# Patient Record
Sex: Female | Born: 1935 | Race: Black or African American | Hispanic: No | Smoking: Former smoker
Health system: Southern US, Community
[De-identification: ages and names within clinical notes are randomized; demographics above are authoritative.]

## PROBLEM LIST (undated history)

## (undated) DIAGNOSIS — I872 Venous insufficiency (chronic) (peripheral): Secondary | ICD-10-CM

## (undated) DIAGNOSIS — N183 Chronic kidney disease, stage 3 (moderate): Secondary | ICD-10-CM

## (undated) DIAGNOSIS — F419 Anxiety disorder, unspecified: Secondary | ICD-10-CM

## (undated) DIAGNOSIS — I119 Hypertensive heart disease without heart failure: Secondary | ICD-10-CM

## (undated) DIAGNOSIS — B20 Human immunodeficiency virus [HIV] disease: Secondary | ICD-10-CM

## (undated) DIAGNOSIS — Z8541 Personal history of malignant neoplasm of cervix uteri: Secondary | ICD-10-CM

## (undated) DIAGNOSIS — F0150 Vascular dementia without behavioral disturbance: Secondary | ICD-10-CM

## (undated) DIAGNOSIS — B351 Tinea unguium: Secondary | ICD-10-CM

## (undated) DIAGNOSIS — I509 Heart failure, unspecified: Secondary | ICD-10-CM

## (undated) DIAGNOSIS — R87612 Low grade squamous intraepithelial lesion on cytologic smear of cervix (LGSIL): Secondary | ICD-10-CM

## (undated) DIAGNOSIS — R911 Solitary pulmonary nodule: Secondary | ICD-10-CM

## (undated) DIAGNOSIS — C55 Malignant neoplasm of uterus, part unspecified: Secondary | ICD-10-CM

## (undated) DIAGNOSIS — C801 Malignant (primary) neoplasm, unspecified: Secondary | ICD-10-CM

## (undated) DIAGNOSIS — D638 Anemia in other chronic diseases classified elsewhere: Secondary | ICD-10-CM

## (undated) DIAGNOSIS — H35039 Hypertensive retinopathy, unspecified eye: Secondary | ICD-10-CM

## (undated) DIAGNOSIS — E663 Overweight: Secondary | ICD-10-CM

## (undated) DIAGNOSIS — I700 Atherosclerosis of aorta: Secondary | ICD-10-CM

## (undated) DIAGNOSIS — F32A Depression, unspecified: Secondary | ICD-10-CM

## (undated) DIAGNOSIS — I739 Peripheral vascular disease, unspecified: Secondary | ICD-10-CM

## (undated) DIAGNOSIS — H3530 Unspecified macular degeneration: Secondary | ICD-10-CM

## (undated) DIAGNOSIS — E559 Vitamin D deficiency, unspecified: Secondary | ICD-10-CM

## (undated) DIAGNOSIS — F0280 Dementia in other diseases classified elsewhere without behavioral disturbance: Secondary | ICD-10-CM

## (undated) DIAGNOSIS — I10 Essential (primary) hypertension: Secondary | ICD-10-CM

## (undated) HISTORY — DX: Solitary pulmonary nodule: R91.1

## (undated) HISTORY — DX: Venous insufficiency (chronic) (peripheral): I87.2

## (undated) HISTORY — DX: Anxiety disorder, unspecified: F41.9

## (undated) HISTORY — DX: Dementia in other diseases classified elsewhere, unspecified severity, without behavioral disturbance, psychotic disturbance, mood disturbance, and anxiety: F02.80

## (undated) HISTORY — DX: Anemia in other chronic diseases classified elsewhere: D63.8

## (undated) HISTORY — DX: Personal history of malignant neoplasm of cervix uteri: Z85.41

## (undated) HISTORY — DX: Hypertensive retinopathy, unspecified eye: H35.039

## (undated) HISTORY — DX: Malignant neoplasm of uterus, part unspecified: C55

## (undated) HISTORY — DX: Heart failure, unspecified: I50.9

## (undated) HISTORY — DX: Overweight: E66.3

## (undated) HISTORY — DX: Atherosclerosis of aorta: I70.0

## (undated) HISTORY — DX: Human immunodeficiency virus (HIV) disease: B20

## (undated) HISTORY — DX: Low grade squamous intraepithelial lesion on cytologic smear of cervix (LGSIL): R87.612

## (undated) HISTORY — DX: Tinea unguium: B35.1

## (undated) HISTORY — DX: Essential (primary) hypertension: I10

## (undated) HISTORY — DX: Vitamin D deficiency, unspecified: E55.9

## (undated) HISTORY — DX: Malignant (primary) neoplasm, unspecified: C80.1

## (undated) HISTORY — PX: ABDOMINAL HYSTERECTOMY: SHX81

## (undated) HISTORY — DX: Chronic kidney disease, stage 3 (moderate): N18.3

## (undated) HISTORY — DX: Vascular dementia, unspecified severity, without behavioral disturbance, psychotic disturbance, mood disturbance, and anxiety: F01.50

## (undated) HISTORY — DX: Peripheral vascular disease, unspecified: I73.9

## (undated) HISTORY — DX: Unspecified macular degeneration: H35.30

## (undated) HISTORY — DX: Hypertensive heart disease without heart failure: I11.9

## (undated) HISTORY — DX: Depression, unspecified: F32.A

---

## 2005-11-06 ENCOUNTER — Encounter: Payer: Self-pay | Admitting: Cardiology

## 2005-11-06 ENCOUNTER — Ambulatory Visit: Payer: Self-pay

## 2006-02-13 ENCOUNTER — Ambulatory Visit (HOSPITAL_COMMUNITY): Admission: RE | Admit: 2006-02-13 | Discharge: 2006-02-13 | Payer: Self-pay | Admitting: Family Medicine

## 2006-02-13 ENCOUNTER — Encounter: Payer: Self-pay | Admitting: Vascular Surgery

## 2006-08-12 ENCOUNTER — Encounter: Payer: Self-pay | Admitting: Internal Medicine

## 2006-09-12 ENCOUNTER — Encounter: Admission: RE | Admit: 2006-09-12 | Discharge: 2006-09-12 | Payer: Self-pay | Admitting: Internal Medicine

## 2006-09-12 ENCOUNTER — Ambulatory Visit: Payer: Self-pay | Admitting: Internal Medicine

## 2006-09-12 DIAGNOSIS — B20 Human immunodeficiency virus [HIV] disease: Secondary | ICD-10-CM

## 2006-09-12 HISTORY — DX: Human immunodeficiency virus (HIV) disease: B20

## 2006-09-12 LAB — CONVERTED CEMR LAB
ALT: 16 units/L (ref 0–35)
AST: 16 units/L (ref 0–37)
Albumin: 4.5 g/dL (ref 3.5–5.2)
Alkaline Phosphatase: 109 units/L (ref 39–117)
BUN: 16 mg/dL (ref 6–23)
Basophils Absolute: 0 10*3/uL (ref 0.0–0.1)
Basophils Relative: 0 % (ref 0–1)
Bilirubin Urine: NEGATIVE
CO2: 20 meq/L (ref 19–32)
Calcium: 10.1 mg/dL (ref 8.4–10.5)
Chlamydia, Swab/Urine, PCR: NEGATIVE
Chloride: 106 meq/L (ref 96–112)
Cholesterol: 209 mg/dL — ABNORMAL HIGH (ref 0–200)
Creatinine, Ser: 0.95 mg/dL (ref 0.40–1.20)
Eosinophils Absolute: 0.1 10*3/uL (ref 0.0–0.7)
Eosinophils Relative: 2 % (ref 0–5)
GC Probe Amp, Urine: NEGATIVE
Glucose, Bld: 91 mg/dL (ref 70–99)
HCT: 38.5 % (ref 36.0–46.0)
HCV Ab: NEGATIVE
HCV Ab: NEGATIVE
HDL: 48 mg/dL (ref >39)
HIV 1 RNA Quant: 9390 copies/mL — ABNORMAL HIGH (ref <50)
HIV-1 RNA Quant, Log: 3.97 — ABNORMAL HIGH (ref <1.70)
HIV-1 antibody: POSITIVE — AB
HIV-2 Ab: UNDETERMINED — AB
HIV: REACTIVE
Hemoglobin: 12.8 g/dL (ref 12.0–15.0)
Hep B Core Total Ab: NEGATIVE
Hep B S Ab: NEGATIVE
Hep B S Ab: NEGATIVE
Hepatitis B Surface Ag: NEGATIVE
Hepatitis B Surface Ag: NEGATIVE
Ketones, ur: NEGATIVE mg/dL
LDL Cholesterol: 122 mg/dL — ABNORMAL HIGH (ref 0–99)
Lymphocytes Relative: 39 % (ref 12–46)
Lymphs Abs: 2.3 10*3/uL (ref 0.7–3.3)
MCHC: 33.2 g/dL (ref 30.0–36.0)
MCV: 92.8 fL (ref 78.0–100.0)
Monocytes Absolute: 0.7 10*3/uL (ref 0.2–0.7)
Monocytes Relative: 11 % (ref 3–11)
Neutro Abs: 2.8 10*3/uL (ref 1.7–7.7)
Neutrophils Relative %: 48 % (ref 43–77)
Nitrite: NEGATIVE
Platelets: 395 10*3/uL (ref 150–400)
Potassium: 4.1 meq/L (ref 3.5–5.3)
Protein, ur: NEGATIVE mg/dL
RBC / HPF: NONE SEEN (ref <3)
RBC: 4.15 M/uL (ref 3.87–5.11)
RDW: 13.6 % (ref 11.5–14.0)
Sodium: 140 meq/L (ref 135–145)
Specific Gravity, Urine: 1.01 (ref 1.005–1.03)
Total Bilirubin: 0.3 mg/dL (ref 0.3–1.2)
Total CHOL/HDL Ratio: 4.4
Total Protein: 9 g/dL — ABNORMAL HIGH (ref 6.0–8.3)
Triglycerides: 193 mg/dL — ABNORMAL HIGH (ref <150)
Urine Glucose: NEGATIVE mg/dL
Urobilinogen, UA: 0.2 (ref 0.0–1.0)
VLDL: 39 mg/dL (ref 0–40)
WBC: 5.9 10*3/uL (ref 4.0–10.5)
pH: 6 (ref 5.0–8.0)

## 2006-09-19 DIAGNOSIS — I10 Essential (primary) hypertension: Secondary | ICD-10-CM

## 2006-09-19 HISTORY — DX: Essential (primary) hypertension: I10

## 2006-09-27 ENCOUNTER — Ambulatory Visit: Payer: Self-pay | Admitting: Internal Medicine

## 2006-10-07 ENCOUNTER — Telehealth: Payer: Self-pay | Admitting: Internal Medicine

## 2006-10-14 ENCOUNTER — Encounter: Payer: Self-pay | Admitting: Internal Medicine

## 2006-10-29 ENCOUNTER — Ambulatory Visit: Payer: Self-pay | Admitting: Internal Medicine

## 2006-10-29 ENCOUNTER — Encounter: Admission: RE | Admit: 2006-10-29 | Discharge: 2006-10-29 | Payer: Self-pay | Admitting: Internal Medicine

## 2006-10-29 LAB — CONVERTED CEMR LAB
ALT: 18 units/L (ref 0–35)
AST: 22 units/L (ref 0–37)
Albumin: 4.2 g/dL (ref 3.5–5.2)
Alkaline Phosphatase: 90 units/L (ref 39–117)
BUN: 13 mg/dL (ref 6–23)
Basophils Absolute: 0 10*3/uL (ref 0.0–0.1)
Basophils Relative: 1 % (ref 0–1)
CO2: 21 meq/L (ref 19–32)
Calcium: 9.8 mg/dL (ref 8.4–10.5)
Chloride: 108 meq/L (ref 96–112)
Creatinine, Ser: 1.12 mg/dL (ref 0.40–1.20)
Eosinophils Absolute: 0.2 10*3/uL (ref 0.0–0.7)
Eosinophils Relative: 4 % (ref 0–5)
Glucose, Bld: 100 mg/dL — ABNORMAL HIGH (ref 70–99)
HCT: 38 % (ref 36.0–46.0)
HIV 1 RNA Quant: 89 copies/mL — ABNORMAL HIGH (ref <50)
HIV-1 RNA Quant, Log: 1.95 — ABNORMAL HIGH (ref <1.70)
Hemoglobin: 12.1 g/dL (ref 12.0–15.0)
Lymphocytes Relative: 35 % (ref 12–46)
Lymphs Abs: 1.4 10*3/uL (ref 0.7–3.3)
MCHC: 31.8 g/dL (ref 30.0–36.0)
MCV: 97.7 fL (ref 78.0–100.0)
Monocytes Absolute: 0.4 10*3/uL (ref 0.2–0.7)
Monocytes Relative: 10 % (ref 3–11)
Neutro Abs: 2.1 10*3/uL (ref 1.7–7.7)
Neutrophils Relative %: 52 % (ref 43–77)
Platelets: 357 10*3/uL (ref 150–400)
Potassium: 4.9 meq/L (ref 3.5–5.3)
RBC: 3.89 M/uL (ref 3.87–5.11)
RDW: 15.1 % — ABNORMAL HIGH (ref 11.5–14.0)
Sodium: 138 meq/L (ref 135–145)
Total Bilirubin: 0.3 mg/dL (ref 0.3–1.2)
Total Protein: 8.1 g/dL (ref 6.0–8.3)
WBC: 4.1 10*3/uL (ref 4.0–10.5)

## 2006-10-30 ENCOUNTER — Encounter: Payer: Self-pay | Admitting: Internal Medicine

## 2006-10-30 LAB — CONVERTED CEMR LAB: CD4 Count: 130 microliters

## 2006-11-05 ENCOUNTER — Encounter (INDEPENDENT_AMBULATORY_CARE_PROVIDER_SITE_OTHER): Payer: Self-pay | Admitting: *Deleted

## 2006-11-13 ENCOUNTER — Ambulatory Visit: Payer: Self-pay | Admitting: Internal Medicine

## 2007-01-27 ENCOUNTER — Encounter: Admission: RE | Admit: 2007-01-27 | Discharge: 2007-01-27 | Payer: Self-pay | Admitting: Internal Medicine

## 2007-01-27 ENCOUNTER — Ambulatory Visit: Payer: Self-pay | Admitting: Internal Medicine

## 2007-01-27 LAB — CONVERTED CEMR LAB
ALT: 13 units/L (ref 0–35)
AST: 16 units/L (ref 0–37)
Albumin: 4.3 g/dL (ref 3.5–5.2)
Alkaline Phosphatase: 88 units/L (ref 39–117)
BUN: 20 mg/dL (ref 6–23)
Basophils Absolute: 0 10*3/uL (ref 0.0–0.1)
Basophils Relative: 1 % (ref 0–1)
CO2: 20 meq/L (ref 19–32)
Calcium: 10.1 mg/dL (ref 8.4–10.5)
Chloride: 108 meq/L (ref 96–112)
Creatinine, Ser: 1.21 mg/dL — ABNORMAL HIGH (ref 0.40–1.20)
Eosinophils Absolute: 0.1 10*3/uL (ref 0.0–0.7)
Eosinophils Relative: 2 % (ref 0–5)
Glucose, Bld: 93 mg/dL (ref 70–99)
HCT: 35.4 % — ABNORMAL LOW (ref 36.0–46.0)
Hemoglobin: 11.1 g/dL — ABNORMAL LOW (ref 12.0–15.0)
Lymphocytes Relative: 43 % (ref 12–46)
Lymphs Abs: 1.9 10*3/uL (ref 0.7–3.3)
MCHC: 31.4 g/dL (ref 30.0–36.0)
MCV: 103.2 fL — ABNORMAL HIGH (ref 78.0–100.0)
Monocytes Absolute: 0.5 10*3/uL (ref 0.2–0.7)
Monocytes Relative: 11 % (ref 3–11)
Neutro Abs: 1.9 10*3/uL (ref 1.7–7.7)
Neutrophils Relative %: 44 % (ref 43–77)
Platelets: 323 10*3/uL (ref 150–400)
Potassium: 5.2 meq/L (ref 3.5–5.3)
RBC: 3.43 M/uL — ABNORMAL LOW (ref 3.87–5.11)
RDW: 15.2 % — ABNORMAL HIGH (ref 11.5–14.0)
Sodium: 139 meq/L (ref 135–145)
Total Bilirubin: 0.2 mg/dL — ABNORMAL LOW (ref 0.3–1.2)
Total Protein: 8 g/dL (ref 6.0–8.3)
WBC: 4.3 10*3/uL (ref 4.0–10.5)

## 2007-02-11 ENCOUNTER — Ambulatory Visit: Payer: Self-pay | Admitting: Internal Medicine

## 2007-02-11 LAB — CONVERTED CEMR LAB
HIV 1 RNA Quant: 125 copies/mL — ABNORMAL HIGH (ref <50)
HIV-1 RNA Quant, Log: 2.1 — ABNORMAL HIGH (ref <1.70)

## 2007-04-28 ENCOUNTER — Encounter: Payer: Self-pay | Admitting: Internal Medicine

## 2007-05-15 ENCOUNTER — Ambulatory Visit: Payer: Self-pay | Admitting: Internal Medicine

## 2007-05-15 ENCOUNTER — Encounter: Admission: RE | Admit: 2007-05-15 | Discharge: 2007-05-15 | Payer: Self-pay | Admitting: Internal Medicine

## 2007-05-15 LAB — CONVERTED CEMR LAB
ALT: 16 units/L (ref 0–35)
AST: 21 units/L (ref 0–37)
Albumin: 4.3 g/dL (ref 3.5–5.2)
Alkaline Phosphatase: 90 units/L (ref 39–117)
BUN: 22 mg/dL (ref 6–23)
Basophils Absolute: 0 10*3/uL (ref 0.0–0.1)
Basophils Relative: 0 % (ref 0–1)
CO2: 20 meq/L (ref 19–32)
Calcium: 9.8 mg/dL (ref 8.4–10.5)
Chloride: 104 meq/L (ref 96–112)
Creatinine, Ser: 1.18 mg/dL (ref 0.40–1.20)
Eosinophils Absolute: 0.1 10*3/uL (ref 0.0–0.7)
Eosinophils Relative: 1 % (ref 0–5)
Glucose, Bld: 97 mg/dL (ref 70–99)
HCT: 37.3 % (ref 36.0–46.0)
HIV 1 RNA Quant: <50 copies/mL (ref <50)
HIV-1 RNA Quant, Log: <1.7 (ref <1.70)
Hemoglobin: 11.7 g/dL — ABNORMAL LOW (ref 12.0–15.0)
Lymphocytes Relative: 43 % (ref 12–46)
Lymphs Abs: 2 10*3/uL (ref 0.7–4.0)
MCHC: 31.4 g/dL (ref 30.0–36.0)
MCV: 101.6 fL — ABNORMAL HIGH (ref 78.0–100.0)
Monocytes Absolute: 0.6 10*3/uL (ref 0.1–1.0)
Monocytes Relative: 12 % (ref 3–12)
Neutro Abs: 2.1 10*3/uL (ref 1.7–7.7)
Neutrophils Relative %: 44 % (ref 43–77)
Platelets: 367 10*3/uL (ref 150–400)
Potassium: 5 meq/L (ref 3.5–5.3)
RBC: 3.67 M/uL — ABNORMAL LOW (ref 3.87–5.11)
RDW: 12.6 % (ref 11.5–15.5)
Sodium: 136 meq/L (ref 135–145)
Total Bilirubin: 0.2 mg/dL — ABNORMAL LOW (ref 0.3–1.2)
Total Protein: 8.3 g/dL (ref 6.0–8.3)
WBC: 4.7 10*3/uL (ref 4.0–10.5)

## 2007-05-28 ENCOUNTER — Ambulatory Visit: Payer: Self-pay | Admitting: Internal Medicine

## 2007-07-23 ENCOUNTER — Encounter: Admission: RE | Admit: 2007-07-23 | Discharge: 2007-07-23 | Payer: Self-pay | Admitting: Internal Medicine

## 2007-07-23 ENCOUNTER — Ambulatory Visit: Payer: Self-pay | Admitting: Internal Medicine

## 2007-07-23 LAB — CONVERTED CEMR LAB
ALT: 19 units/L (ref 0–35)
AST: 20 units/L (ref 0–37)
Albumin: 4.4 g/dL (ref 3.5–5.2)
Alkaline Phosphatase: 101 units/L (ref 39–117)
BUN: 17 mg/dL (ref 6–23)
Basophils Absolute: 0 10*3/uL (ref 0.0–0.1)
Basophils Relative: 0 % (ref 0–1)
CO2: 18 meq/L — ABNORMAL LOW (ref 19–32)
Calcium: 9.4 mg/dL (ref 8.4–10.5)
Chloride: 109 meq/L (ref 96–112)
Cholesterol: 185 mg/dL (ref 0–200)
Creatinine, Ser: 1.01 mg/dL (ref 0.40–1.20)
Eosinophils Absolute: 0.1 10*3/uL (ref 0.0–0.7)
Eosinophils Relative: 2 % (ref 0–5)
Glucose, Bld: 102 mg/dL — ABNORMAL HIGH (ref 70–99)
HCT: 34.9 % — ABNORMAL LOW (ref 36.0–46.0)
HDL: 65 mg/dL (ref >39)
HIV 1 RNA Quant: <50 copies/mL (ref <50)
HIV-1 RNA Quant, Log: <1.7 (ref <1.70)
Hemoglobin: 11.6 g/dL — ABNORMAL LOW (ref 12.0–15.0)
LDL Cholesterol: 102 mg/dL — ABNORMAL HIGH (ref 0–99)
Lymphocytes Relative: 34 % (ref 12–46)
Lymphs Abs: 1.7 10*3/uL (ref 0.7–4.0)
MCHC: 33.2 g/dL (ref 30.0–36.0)
MCV: 100.3 fL — ABNORMAL HIGH (ref 78.0–100.0)
Monocytes Absolute: 0.5 10*3/uL (ref 0.1–1.0)
Monocytes Relative: 11 % (ref 3–12)
Neutro Abs: 2.7 10*3/uL (ref 1.7–7.7)
Neutrophils Relative %: 53 % (ref 43–77)
Platelets: 387 10*3/uL (ref 150–400)
Potassium: 4.8 meq/L (ref 3.5–5.3)
RBC: 3.48 M/uL — ABNORMAL LOW (ref 3.87–5.11)
RDW: 14.5 % (ref 11.5–15.5)
Sodium: 139 meq/L (ref 135–145)
Total Bilirubin: 0.2 mg/dL — ABNORMAL LOW (ref 0.3–1.2)
Total CHOL/HDL Ratio: 2.8
Total Protein: 8.1 g/dL (ref 6.0–8.3)
Triglycerides: 88 mg/dL (ref <150)
VLDL: 18 mg/dL (ref 0–40)
WBC: 5 10*3/uL (ref 4.0–10.5)

## 2007-08-06 ENCOUNTER — Encounter: Payer: Self-pay | Admitting: Internal Medicine

## 2007-08-06 ENCOUNTER — Ambulatory Visit: Payer: Self-pay | Admitting: Internal Medicine

## 2007-08-06 DIAGNOSIS — R87612 Low grade squamous intraepithelial lesion on cytologic smear of cervix (LGSIL): Secondary | ICD-10-CM

## 2007-08-06 HISTORY — DX: Low grade squamous intraepithelial lesion on cytologic smear of cervix (LGSIL): R87.612

## 2007-08-06 LAB — CONVERTED CEMR LAB: Pap Smear: ABNORMAL

## 2007-08-29 ENCOUNTER — Telehealth: Payer: Self-pay | Admitting: Internal Medicine

## 2007-10-02 ENCOUNTER — Encounter: Payer: Self-pay | Admitting: Internal Medicine

## 2007-10-16 ENCOUNTER — Ambulatory Visit: Payer: Self-pay | Admitting: Obstetrics & Gynecology

## 2007-10-16 ENCOUNTER — Other Ambulatory Visit: Admission: RE | Admit: 2007-10-16 | Discharge: 2007-10-16 | Payer: Self-pay | Admitting: Gynecology

## 2007-10-29 ENCOUNTER — Ambulatory Visit: Payer: Self-pay | Admitting: Gynecology

## 2007-11-12 ENCOUNTER — Ambulatory Visit: Payer: Self-pay | Admitting: Internal Medicine

## 2007-11-12 ENCOUNTER — Encounter: Admission: RE | Admit: 2007-11-12 | Discharge: 2007-11-12 | Payer: Self-pay | Admitting: Internal Medicine

## 2007-11-12 LAB — CONVERTED CEMR LAB
ALT: 18 units/L (ref 0–35)
AST: 16 units/L (ref 0–37)
Albumin: 4.1 g/dL (ref 3.5–5.2)
Alkaline Phosphatase: 84 units/L (ref 39–117)
BUN: 22 mg/dL (ref 6–23)
Basophils Absolute: 0 10*3/uL (ref 0.0–0.1)
Basophils Relative: 1 % (ref 0–1)
CO2: 19 meq/L (ref 19–32)
Calcium: 10.1 mg/dL (ref 8.4–10.5)
Chloride: 105 meq/L (ref 96–112)
Creatinine, Ser: 1.31 mg/dL — ABNORMAL HIGH (ref 0.40–1.20)
Eosinophils Absolute: 0.1 10*3/uL (ref 0.0–0.7)
Eosinophils Relative: 2 % (ref 0–5)
Glucose, Bld: 108 mg/dL — ABNORMAL HIGH (ref 70–99)
HCT: 37.2 % (ref 36.0–46.0)
HIV 1 RNA Quant: <50 copies/mL (ref <50)
HIV-1 RNA Quant, Log: <1.7 (ref <1.70)
Hemoglobin: 11.6 g/dL — ABNORMAL LOW (ref 12.0–15.0)
Lymphocytes Relative: 38 % (ref 12–46)
Lymphs Abs: 1.8 10*3/uL (ref 0.7–4.0)
MCHC: 31.2 g/dL (ref 30.0–36.0)
MCV: 101.6 fL — ABNORMAL HIGH (ref 78.0–100.0)
Monocytes Absolute: 0.5 10*3/uL (ref 0.1–1.0)
Monocytes Relative: 11 % (ref 3–12)
Neutro Abs: 2.3 10*3/uL (ref 1.7–7.7)
Neutrophils Relative %: 48 % (ref 43–77)
Platelets: 358 10*3/uL (ref 150–400)
Potassium: 5 meq/L (ref 3.5–5.3)
RBC: 3.66 M/uL — ABNORMAL LOW (ref 3.87–5.11)
RDW: 13.6 % (ref 11.5–15.5)
Sodium: 138 meq/L (ref 135–145)
Total Bilirubin: 0.2 mg/dL — ABNORMAL LOW (ref 0.3–1.2)
Total Protein: 8.3 g/dL (ref 6.0–8.3)
WBC: 4.7 10*3/uL (ref 4.0–10.5)

## 2007-11-26 ENCOUNTER — Ambulatory Visit: Payer: Self-pay | Admitting: Internal Medicine

## 2008-02-18 ENCOUNTER — Ambulatory Visit: Payer: Self-pay | Admitting: Internal Medicine

## 2008-02-18 LAB — CONVERTED CEMR LAB
ALT: 14 units/L (ref 0–35)
AST: 15 units/L (ref 0–37)
Albumin: 4.5 g/dL (ref 3.5–5.2)
Alkaline Phosphatase: 91 units/L (ref 39–117)
BUN: 20 mg/dL (ref 6–23)
Basophils Absolute: 0 10*3/uL (ref 0.0–0.1)
Basophils Relative: 0 % (ref 0–1)
CO2: 19 meq/L (ref 19–32)
Calcium: 9.8 mg/dL (ref 8.4–10.5)
Chloride: 105 meq/L (ref 96–112)
Creatinine, Ser: 1.22 mg/dL — ABNORMAL HIGH (ref 0.40–1.20)
Eosinophils Absolute: 0.1 10*3/uL (ref 0.0–0.7)
Eosinophils Relative: 2 % (ref 0–5)
Glucose, Bld: 100 mg/dL — ABNORMAL HIGH (ref 70–99)
HCT: 35.7 % — ABNORMAL LOW (ref 36.0–46.0)
HIV 1 RNA Quant: 210 copies/mL — ABNORMAL HIGH (ref <50)
HIV-1 RNA Quant, Log: 2.32 — ABNORMAL HIGH (ref <1.70)
Hemoglobin: 11.6 g/dL — ABNORMAL LOW (ref 12.0–15.0)
Lymphocytes Relative: 47 % — ABNORMAL HIGH (ref 12–46)
Lymphs Abs: 2.6 10*3/uL (ref 0.7–4.0)
MCHC: 32.5 g/dL (ref 30.0–36.0)
MCV: 98.3 fL (ref 78.0–100.0)
Monocytes Absolute: 0.6 10*3/uL (ref 0.1–1.0)
Monocytes Relative: 11 % (ref 3–12)
Neutro Abs: 2.2 10*3/uL (ref 1.7–7.7)
Neutrophils Relative %: 40 % — ABNORMAL LOW (ref 43–77)
Platelets: 439 10*3/uL — ABNORMAL HIGH (ref 150–400)
Potassium: 5.3 meq/L (ref 3.5–5.3)
RBC: 3.63 M/uL — ABNORMAL LOW (ref 3.87–5.11)
RDW: 13.6 % (ref 11.5–15.5)
Sodium: 136 meq/L (ref 135–145)
Total Bilirubin: 0.2 mg/dL — ABNORMAL LOW (ref 0.3–1.2)
Total Protein: 8.4 g/dL — ABNORMAL HIGH (ref 6.0–8.3)
WBC: 5.5 10*3/uL (ref 4.0–10.5)

## 2008-03-03 ENCOUNTER — Ambulatory Visit: Payer: Self-pay | Admitting: Internal Medicine

## 2008-05-05 ENCOUNTER — Ambulatory Visit: Payer: Self-pay | Admitting: Internal Medicine

## 2008-05-05 LAB — CONVERTED CEMR LAB
ALT: 18 units/L (ref 0–35)
AST: 18 units/L (ref 0–37)
Albumin: 4.3 g/dL (ref 3.5–5.2)
Alkaline Phosphatase: 94 units/L (ref 39–117)
BUN: 17 mg/dL (ref 6–23)
Basophils Absolute: 0 10*3/uL (ref 0.0–0.1)
Basophils Relative: 1 % (ref 0–1)
CO2: 20 meq/L (ref 19–32)
Calcium: 10.2 mg/dL (ref 8.4–10.5)
Chloride: 104 meq/L (ref 96–112)
Creatinine, Ser: 1.21 mg/dL — ABNORMAL HIGH (ref 0.40–1.20)
Eosinophils Absolute: 0.1 10*3/uL (ref 0.0–0.7)
Eosinophils Relative: 1 % (ref 0–5)
Glucose, Bld: 99 mg/dL (ref 70–99)
HCT: 38 % (ref 36.0–46.0)
HIV 1 RNA Quant: 117 copies/mL — ABNORMAL HIGH (ref <48)
HIV-1 RNA Quant, Log: 2.07 — ABNORMAL HIGH (ref <1.68)
Hemoglobin: 12.2 g/dL (ref 12.0–15.0)
Lymphocytes Relative: 36 % (ref 12–46)
Lymphs Abs: 2.2 10*3/uL (ref 0.7–4.0)
MCHC: 32.1 g/dL (ref 30.0–36.0)
MCV: 101.1 fL — ABNORMAL HIGH (ref 78.0–100.0)
Monocytes Absolute: 0.8 10*3/uL (ref 0.1–1.0)
Monocytes Relative: 14 % — ABNORMAL HIGH (ref 3–12)
Neutro Abs: 3 10*3/uL (ref 1.7–7.7)
Neutrophils Relative %: 49 % (ref 43–77)
Platelets: 364 10*3/uL (ref 150–400)
Potassium: 5.1 meq/L (ref 3.5–5.3)
RBC: 3.76 M/uL — ABNORMAL LOW (ref 3.87–5.11)
RDW: 13.8 % (ref 11.5–15.5)
Sodium: 138 meq/L (ref 135–145)
Total Bilirubin: 0.3 mg/dL (ref 0.3–1.2)
Total Protein: 8.2 g/dL (ref 6.0–8.3)
WBC: 6.1 10*3/uL (ref 4.0–10.5)

## 2008-05-18 ENCOUNTER — Encounter: Payer: Self-pay | Admitting: Internal Medicine

## 2008-05-19 ENCOUNTER — Ambulatory Visit: Payer: Self-pay | Admitting: Internal Medicine

## 2008-05-21 ENCOUNTER — Encounter (INDEPENDENT_AMBULATORY_CARE_PROVIDER_SITE_OTHER): Payer: Self-pay | Admitting: *Deleted

## 2008-05-21 ENCOUNTER — Telehealth (INDEPENDENT_AMBULATORY_CARE_PROVIDER_SITE_OTHER): Payer: Self-pay | Admitting: *Deleted

## 2008-05-25 ENCOUNTER — Telehealth: Payer: Self-pay | Admitting: Internal Medicine

## 2008-05-27 ENCOUNTER — Ambulatory Visit: Payer: Self-pay | Admitting: Internal Medicine

## 2008-05-27 ENCOUNTER — Encounter: Payer: Self-pay | Admitting: Obstetrics & Gynecology

## 2008-05-27 ENCOUNTER — Ambulatory Visit: Payer: Self-pay | Admitting: Obstetrics and Gynecology

## 2008-05-27 ENCOUNTER — Encounter (INDEPENDENT_AMBULATORY_CARE_PROVIDER_SITE_OTHER): Payer: Self-pay | Admitting: *Deleted

## 2008-05-27 LAB — CONVERTED CEMR LAB: Pap Smear: ABNORMAL

## 2008-06-09 ENCOUNTER — Ambulatory Visit: Payer: Self-pay | Admitting: Internal Medicine

## 2008-06-18 ENCOUNTER — Ambulatory Visit: Payer: Self-pay | Admitting: Internal Medicine

## 2008-08-09 ENCOUNTER — Encounter (INDEPENDENT_AMBULATORY_CARE_PROVIDER_SITE_OTHER): Payer: Self-pay | Admitting: *Deleted

## 2008-08-18 ENCOUNTER — Ambulatory Visit: Payer: Self-pay | Admitting: Internal Medicine

## 2008-08-18 LAB — CONVERTED CEMR LAB
ALT: 19 units/L (ref 0–35)
AST: 18 units/L (ref 0–37)
Albumin: 3.8 g/dL (ref 3.5–5.2)
Alkaline Phosphatase: 84 units/L (ref 39–117)
BUN: 26 mg/dL — ABNORMAL HIGH (ref 6–23)
Basophils Absolute: 0 10*3/uL (ref 0.0–0.1)
Basophils Relative: 0 % (ref 0–1)
CO2: 21 meq/L (ref 19–32)
Calcium: 9.7 mg/dL (ref 8.4–10.5)
Chloride: 106 meq/L (ref 96–112)
Creatinine, Ser: 1.55 mg/dL — ABNORMAL HIGH (ref 0.40–1.20)
Eosinophils Absolute: 0.1 10*3/uL (ref 0.0–0.7)
Eosinophils Relative: 2 % (ref 0–5)
GFR calc Af Amer: 39 mL/min — ABNORMAL LOW (ref >60)
GFR calc non Af Amer: 33 mL/min — ABNORMAL LOW (ref >60)
Glucose, Bld: 100 mg/dL — ABNORMAL HIGH (ref 70–99)
HCT: 32.5 % — ABNORMAL LOW (ref 36.0–46.0)
HIV 1 RNA Quant: <48 copies/mL (ref <48)
HIV-1 RNA Quant, Log: <1.68 (ref <1.68)
Hemoglobin: 10.5 g/dL — ABNORMAL LOW (ref 12.0–15.0)
Lymphocytes Relative: 39 % (ref 12–46)
Lymphs Abs: 1.7 10*3/uL (ref 0.7–4.0)
MCHC: 32.3 g/dL (ref 30.0–36.0)
MCV: 95.9 fL (ref 78.0–100.0)
Monocytes Absolute: 0.4 10*3/uL (ref 0.1–1.0)
Monocytes Relative: 9 % (ref 3–12)
Neutro Abs: 2.2 10*3/uL (ref 1.7–7.7)
Neutrophils Relative %: 50 % (ref 43–77)
Platelets: 324 10*3/uL (ref 150–400)
Potassium: 4.5 meq/L (ref 3.5–5.3)
RBC: 3.39 M/uL — ABNORMAL LOW (ref 3.87–5.11)
RDW: 13.8 % (ref 11.5–15.5)
Sodium: 139 meq/L (ref 135–145)
Total Bilirubin: 0.2 mg/dL — ABNORMAL LOW (ref 0.3–1.2)
Total Protein: 7.6 g/dL (ref 6.0–8.3)
WBC: 4.5 10*3/uL (ref 4.0–10.5)

## 2008-09-01 ENCOUNTER — Ambulatory Visit: Payer: Self-pay | Admitting: Internal Medicine

## 2008-09-01 DIAGNOSIS — N183 Chronic kidney disease, stage 3 unspecified: Secondary | ICD-10-CM

## 2008-09-01 DIAGNOSIS — N1832 Chronic kidney disease, stage 3b: Secondary | ICD-10-CM | POA: Insufficient documentation

## 2008-09-01 HISTORY — DX: Chronic kidney disease, stage 3 unspecified: N18.30

## 2008-11-05 ENCOUNTER — Ambulatory Visit: Payer: Self-pay | Admitting: Obstetrics & Gynecology

## 2008-11-05 ENCOUNTER — Telehealth: Payer: Self-pay | Admitting: Internal Medicine

## 2008-11-05 ENCOUNTER — Encounter: Payer: Self-pay | Admitting: Obstetrics & Gynecology

## 2008-11-05 ENCOUNTER — Emergency Department (HOSPITAL_COMMUNITY): Admission: EM | Admit: 2008-11-05 | Discharge: 2008-11-05 | Payer: Self-pay | Admitting: Emergency Medicine

## 2008-12-08 ENCOUNTER — Ambulatory Visit: Payer: Self-pay | Admitting: Internal Medicine

## 2008-12-08 LAB — CONVERTED CEMR LAB
ALT: 13 units/L (ref 0–35)
AST: 17 units/L (ref 0–37)
Albumin: 4.3 g/dL (ref 3.5–5.2)
Alkaline Phosphatase: 90 units/L (ref 39–117)
BUN: 19 mg/dL (ref 6–23)
Basophils Absolute: 0 10*3/uL (ref 0.0–0.1)
Basophils Relative: 1 % (ref 0–1)
CO2: 22 meq/L (ref 19–32)
Calcium: 10.4 mg/dL (ref 8.4–10.5)
Chloride: 109 meq/L (ref 96–112)
Creatinine, Ser: 1.09 mg/dL (ref 0.40–1.20)
Eosinophils Absolute: 0.2 10*3/uL (ref 0.0–0.7)
Eosinophils Relative: 3 % (ref 0–5)
Glucose, Bld: 84 mg/dL (ref 70–99)
HCT: 36.6 % (ref 36.0–46.0)
HIV 1 RNA Quant: 612 copies/mL — ABNORMAL HIGH (ref <48)
HIV-1 RNA Quant, Log: 2.79 — ABNORMAL HIGH (ref <1.68)
Hemoglobin: 11.5 g/dL — ABNORMAL LOW (ref 12.0–15.0)
Lymphocytes Relative: 33 % (ref 12–46)
Lymphs Abs: 2.1 10*3/uL (ref 0.7–4.0)
MCHC: 31.4 g/dL (ref 30.0–36.0)
MCV: 101.9 fL — ABNORMAL HIGH (ref >=78.0)
Monocytes Absolute: 0.7 10*3/uL (ref 0.1–1.0)
Monocytes Relative: 11 % (ref 3–12)
Neutro Abs: 3.4 10*3/uL (ref 1.7–7.7)
Neutrophils Relative %: 53 % (ref 43–77)
Platelets: 392 10*3/uL (ref 150–400)
Potassium: 4.7 meq/L (ref 3.5–5.3)
RBC: 3.59 M/uL — ABNORMAL LOW (ref 3.87–5.11)
RDW: 13 % (ref 11.5–15.5)
Sodium: 143 meq/L (ref 135–145)
Total Bilirubin: 0.3 mg/dL (ref 0.3–1.2)
Total Protein: 8.1 g/dL (ref 6.0–8.3)
WBC: 6.4 10*3/uL (ref 4.0–10.5)

## 2008-12-09 ENCOUNTER — Ambulatory Visit: Payer: Self-pay | Admitting: Obstetrics and Gynecology

## 2008-12-28 ENCOUNTER — Ambulatory Visit: Payer: Self-pay | Admitting: Internal Medicine

## 2009-01-20 ENCOUNTER — Ambulatory Visit: Payer: Self-pay | Admitting: Obstetrics & Gynecology

## 2009-03-02 ENCOUNTER — Ambulatory Visit: Payer: Self-pay | Admitting: Internal Medicine

## 2009-03-02 LAB — CONVERTED CEMR LAB
ALT: 18 units/L (ref 0–35)
AST: 25 units/L (ref 0–37)
Albumin: 4.2 g/dL (ref 3.5–5.2)
Alkaline Phosphatase: 84 units/L (ref 39–117)
BUN: 18 mg/dL (ref 6–23)
Basophils Absolute: 0 10*3/uL (ref 0.0–0.1)
Basophils Relative: 1 % (ref 0–1)
CO2: 21 meq/L (ref 19–32)
Calcium: 10 mg/dL (ref 8.4–10.5)
Chloride: 106 meq/L (ref 96–112)
Creatinine, Ser: 1 mg/dL (ref 0.40–1.20)
Eosinophils Absolute: 0.2 10*3/uL (ref 0.0–0.7)
Eosinophils Relative: 4 % (ref 0–5)
Glucose, Bld: 97 mg/dL (ref 70–99)
HCT: 37.5 % (ref 36.0–46.0)
HIV 1 RNA Quant: 19700 copies/mL — ABNORMAL HIGH (ref <48)
HIV-1 RNA Quant, Log: 4.29 — ABNORMAL HIGH (ref <1.68)
Hemoglobin: 11.9 g/dL — ABNORMAL LOW (ref 12.0–15.0)
Lymphocytes Relative: 36 % (ref 12–46)
Lymphs Abs: 1.9 10*3/uL (ref 0.7–4.0)
MCHC: 31.7 g/dL (ref 30.0–36.0)
MCV: 95.2 fL (ref >=78.0)
Monocytes Absolute: 0.6 10*3/uL (ref 0.1–1.0)
Monocytes Relative: 11 % (ref 3–12)
Neutro Abs: 2.6 10*3/uL (ref 1.7–7.7)
Neutrophils Relative %: 49 % (ref 43–77)
Platelets: 355 10*3/uL (ref 150–400)
Potassium: 4.5 meq/L (ref 3.5–5.3)
RBC: 3.94 M/uL (ref 3.87–5.11)
RDW: 13 % (ref 11.5–15.5)
Sodium: 141 meq/L (ref 135–145)
Total Bilirubin: 0.3 mg/dL (ref 0.3–1.2)
Total Protein: 8.1 g/dL (ref 6.0–8.3)
WBC: 5.2 10*3/uL (ref 4.0–10.5)

## 2009-03-16 ENCOUNTER — Ambulatory Visit: Payer: Self-pay | Admitting: Internal Medicine

## 2009-03-16 LAB — CONVERTED CEMR LAB

## 2009-04-13 ENCOUNTER — Ambulatory Visit: Payer: Self-pay | Admitting: Internal Medicine

## 2009-05-11 ENCOUNTER — Ambulatory Visit: Payer: Self-pay | Admitting: Obstetrics and Gynecology

## 2009-05-25 ENCOUNTER — Ambulatory Visit: Payer: Self-pay | Admitting: Internal Medicine

## 2009-05-25 LAB — CONVERTED CEMR LAB
ALT: 18 units/L (ref 0–35)
AST: 19 units/L (ref 0–37)
Albumin: 4.1 g/dL (ref 3.5–5.2)
Alkaline Phosphatase: 73 units/L (ref 39–117)
BUN: 24 mg/dL — ABNORMAL HIGH (ref 6–23)
Basophils Absolute: 0 10*3/uL (ref 0.0–0.1)
Basophils Relative: 1 % (ref 0–1)
CO2: 21 meq/L (ref 19–32)
Calcium: 9.3 mg/dL (ref 8.4–10.5)
Chloride: 106 meq/L (ref 96–112)
Creatinine, Ser: 1.06 mg/dL (ref 0.40–1.20)
Eosinophils Absolute: 0.2 10*3/uL (ref 0.0–0.7)
Eosinophils Relative: 3 % (ref 0–5)
Glucose, Bld: 107 mg/dL — ABNORMAL HIGH (ref 70–99)
HCT: 34.9 % — ABNORMAL LOW (ref 36.0–46.0)
HIV 1 RNA Quant: 20800 copies/mL — ABNORMAL HIGH (ref <48)
HIV-1 RNA Quant, Log: 4.32 — ABNORMAL HIGH (ref <1.68)
Hemoglobin: 11.4 g/dL — ABNORMAL LOW (ref 12.0–15.0)
Lymphocytes Relative: 34 % (ref 12–46)
Lymphs Abs: 1.7 10*3/uL (ref 0.7–4.0)
MCHC: 32.7 g/dL (ref 30.0–36.0)
MCV: 94.1 fL (ref >=78.0)
Monocytes Absolute: 0.6 10*3/uL (ref 0.1–1.0)
Monocytes Relative: 12 % (ref 3–12)
Neutro Abs: 2.5 10*3/uL (ref 1.7–7.7)
Neutrophils Relative %: 50 % (ref 43–77)
Platelets: 367 10*3/uL (ref 150–400)
Potassium: 4.3 meq/L (ref 3.5–5.3)
RBC: 3.71 M/uL — ABNORMAL LOW (ref 3.87–5.11)
RDW: 14.6 % (ref 11.5–15.5)
Sodium: 138 meq/L (ref 135–145)
Total Bilirubin: 0.4 mg/dL (ref 0.3–1.2)
Total Protein: 8.3 g/dL (ref 6.0–8.3)
WBC: 5 10*3/uL (ref 4.0–10.5)

## 2009-06-08 ENCOUNTER — Ambulatory Visit: Payer: Self-pay | Admitting: Internal Medicine

## 2009-06-14 ENCOUNTER — Encounter: Payer: Self-pay | Admitting: Internal Medicine

## 2009-07-06 ENCOUNTER — Ambulatory Visit: Payer: Self-pay | Admitting: Internal Medicine

## 2009-08-17 ENCOUNTER — Ambulatory Visit: Payer: Self-pay | Admitting: Internal Medicine

## 2009-08-17 LAB — CONVERTED CEMR LAB
ALT: 16 units/L (ref 0–35)
AST: 17 units/L (ref 0–37)
Albumin: 3.9 g/dL (ref 3.5–5.2)
Alkaline Phosphatase: 73 units/L (ref 39–117)
BUN: 14 mg/dL (ref 6–23)
Basophils Absolute: 0 10*3/uL (ref 0.0–0.1)
Basophils Relative: 1 % (ref 0–1)
CO2: 21 meq/L (ref 19–32)
Calcium: 10 mg/dL (ref 8.4–10.5)
Chloride: 107 meq/L (ref 96–112)
Creatinine, Ser: 1.04 mg/dL (ref 0.40–1.20)
Eosinophils Absolute: 0.2 10*3/uL (ref 0.0–0.7)
Eosinophils Relative: 3 % (ref 0–5)
Glucose, Bld: 97 mg/dL (ref 70–99)
HCT: 36.9 % (ref 36.0–46.0)
HIV 1 RNA Quant: <48 copies/mL (ref <48)
HIV-1 RNA Quant, Log: <1.68 (ref <1.68)
Hemoglobin: 11.7 g/dL — ABNORMAL LOW (ref 12.0–15.0)
Lymphocytes Relative: 42 % (ref 12–46)
Lymphs Abs: 2.5 10*3/uL (ref 0.7–4.0)
MCHC: 31.7 g/dL (ref 30.0–36.0)
MCV: 93.9 fL (ref 78.0–100.0)
Monocytes Absolute: 0.6 10*3/uL (ref 0.1–1.0)
Monocytes Relative: 11 % (ref 3–12)
Neutro Abs: 2.7 10*3/uL (ref 1.7–7.7)
Neutrophils Relative %: 44 % (ref 43–77)
Platelets: 383 10*3/uL (ref 150–400)
Potassium: 4.2 meq/L (ref 3.5–5.3)
RBC: 3.93 M/uL (ref 3.87–5.11)
RDW: 15.1 % (ref 11.5–15.5)
Sodium: 140 meq/L (ref 135–145)
Total Bilirubin: 0.3 mg/dL (ref 0.3–1.2)
Total Protein: 7.9 g/dL (ref 6.0–8.3)
WBC: 6 10*3/uL (ref 4.0–10.5)

## 2009-08-31 ENCOUNTER — Encounter (INDEPENDENT_AMBULATORY_CARE_PROVIDER_SITE_OTHER): Payer: Self-pay | Admitting: *Deleted

## 2009-08-31 ENCOUNTER — Other Ambulatory Visit: Admission: RE | Admit: 2009-08-31 | Discharge: 2009-08-31 | Payer: Self-pay | Admitting: Internal Medicine

## 2009-08-31 ENCOUNTER — Ambulatory Visit: Payer: Self-pay | Admitting: Internal Medicine

## 2009-08-31 LAB — CONVERTED CEMR LAB
Pap Smear: DETECTED
Pap Smear: UNDETERMINED

## 2009-09-13 ENCOUNTER — Encounter (INDEPENDENT_AMBULATORY_CARE_PROVIDER_SITE_OTHER): Payer: Self-pay | Admitting: *Deleted

## 2009-09-15 ENCOUNTER — Encounter: Payer: Self-pay | Admitting: Internal Medicine

## 2009-09-15 ENCOUNTER — Encounter (INDEPENDENT_AMBULATORY_CARE_PROVIDER_SITE_OTHER): Payer: Self-pay | Admitting: *Deleted

## 2009-09-29 ENCOUNTER — Telehealth: Payer: Self-pay | Admitting: Internal Medicine

## 2009-09-30 ENCOUNTER — Encounter (INDEPENDENT_AMBULATORY_CARE_PROVIDER_SITE_OTHER): Payer: Self-pay | Admitting: *Deleted

## 2009-10-05 ENCOUNTER — Telehealth: Payer: Self-pay | Admitting: Internal Medicine

## 2009-10-12 ENCOUNTER — Ambulatory Visit (HOSPITAL_COMMUNITY): Admission: RE | Admit: 2009-10-12 | Discharge: 2009-10-12 | Payer: Self-pay | Admitting: Internal Medicine

## 2009-11-10 ENCOUNTER — Ambulatory Visit: Payer: Self-pay | Admitting: Obstetrics and Gynecology

## 2009-11-17 ENCOUNTER — Encounter: Payer: Self-pay | Admitting: Internal Medicine

## 2009-11-17 ENCOUNTER — Encounter (INDEPENDENT_AMBULATORY_CARE_PROVIDER_SITE_OTHER): Payer: Self-pay | Admitting: *Deleted

## 2009-11-18 ENCOUNTER — Encounter: Payer: Self-pay | Admitting: Internal Medicine

## 2009-11-18 ENCOUNTER — Telehealth: Payer: Self-pay | Admitting: Internal Medicine

## 2009-11-23 ENCOUNTER — Encounter: Payer: Self-pay | Admitting: Internal Medicine

## 2009-12-08 ENCOUNTER — Telehealth: Payer: Self-pay | Admitting: Internal Medicine

## 2009-12-16 ENCOUNTER — Telehealth: Payer: Self-pay | Admitting: Infectious Disease

## 2010-01-11 ENCOUNTER — Telehealth: Payer: Self-pay | Admitting: Internal Medicine

## 2010-01-12 ENCOUNTER — Encounter: Payer: Self-pay | Admitting: Internal Medicine

## 2010-01-16 ENCOUNTER — Telehealth (INDEPENDENT_AMBULATORY_CARE_PROVIDER_SITE_OTHER): Payer: Self-pay | Admitting: *Deleted

## 2010-01-18 ENCOUNTER — Ambulatory Visit: Payer: Self-pay | Admitting: Internal Medicine

## 2010-01-18 LAB — CONVERTED CEMR LAB
ALT: 18 units/L (ref 0–35)
AST: 21 units/L (ref 0–37)
Albumin: 4.2 g/dL (ref 3.5–5.2)
Alkaline Phosphatase: 101 units/L (ref 39–117)
BUN: 20 mg/dL (ref 6–23)
Basophils Absolute: 0 10*3/uL (ref 0.0–0.1)
Basophils Relative: 1 % (ref 0–1)
CO2: 23 meq/L (ref 19–32)
Calcium: 9.8 mg/dL (ref 8.4–10.5)
Chloride: 106 meq/L (ref 96–112)
Cholesterol: 183 mg/dL (ref 0–200)
Creatinine, Ser: 1.15 mg/dL (ref 0.40–1.20)
Eosinophils Absolute: 0.3 10*3/uL (ref 0.0–0.7)
Eosinophils Relative: 5 % (ref 0–5)
Glucose, Bld: 91 mg/dL (ref 70–99)
HCT: 35.5 % — ABNORMAL LOW (ref 36.0–46.0)
HDL: 41 mg/dL (ref >39)
HIV 1 RNA Quant: <20 copies/mL (ref <20)
HIV-1 RNA Quant, Log: <1.3 (ref <1.30)
Hemoglobin: 11.4 g/dL — ABNORMAL LOW (ref 12.0–15.0)
LDL Cholesterol: 104 mg/dL — ABNORMAL HIGH (ref 0–99)
Lymphocytes Relative: 34 % (ref 12–46)
Lymphs Abs: 2 10*3/uL (ref 0.7–4.0)
MCHC: 32.1 g/dL (ref 30.0–36.0)
MCV: 101.1 fL — ABNORMAL HIGH (ref 78.0–100.0)
Monocytes Absolute: 0.6 10*3/uL (ref 0.1–1.0)
Monocytes Relative: 10 % (ref 3–12)
Neutro Abs: 3 10*3/uL (ref 1.7–7.7)
Neutrophils Relative %: 51 % (ref 43–77)
Platelets: 400 10*3/uL (ref 150–400)
Potassium: 4.3 meq/L (ref 3.5–5.3)
RBC: 3.51 M/uL — ABNORMAL LOW (ref 3.87–5.11)
RDW: 13.9 % (ref 11.5–15.5)
Sodium: 141 meq/L (ref 135–145)
Total Bilirubin: 0.2 mg/dL — ABNORMAL LOW (ref 0.3–1.2)
Total CHOL/HDL Ratio: 4.5
Total Protein: 8 g/dL (ref 6.0–8.3)
Triglycerides: 189 mg/dL — ABNORMAL HIGH (ref <150)
VLDL: 38 mg/dL (ref 0–40)
WBC: 5.9 10*3/uL (ref 4.0–10.5)

## 2010-02-01 ENCOUNTER — Ambulatory Visit: Payer: Self-pay | Admitting: Internal Medicine

## 2010-02-10 ENCOUNTER — Encounter: Payer: Self-pay | Admitting: Internal Medicine

## 2010-02-17 ENCOUNTER — Encounter: Payer: Self-pay | Admitting: Internal Medicine

## 2010-03-01 ENCOUNTER — Encounter: Payer: Self-pay | Admitting: Internal Medicine

## 2010-04-11 ENCOUNTER — Encounter (INDEPENDENT_AMBULATORY_CARE_PROVIDER_SITE_OTHER): Payer: Self-pay | Admitting: *Deleted

## 2010-04-20 ENCOUNTER — Encounter: Payer: Self-pay | Admitting: Internal Medicine

## 2010-05-04 ENCOUNTER — Encounter: Payer: Self-pay | Admitting: Internal Medicine

## 2010-05-10 ENCOUNTER — Ambulatory Visit
Admission: RE | Admit: 2010-05-10 | Discharge: 2010-05-10 | Payer: Self-pay | Source: Home / Self Care | Attending: Infectious Diseases | Admitting: Infectious Diseases

## 2010-05-10 ENCOUNTER — Encounter: Payer: Self-pay | Admitting: Internal Medicine

## 2010-05-10 LAB — CONVERTED CEMR LAB
ALT: 20 units/L (ref 0–35)
AST: 21 units/L (ref 0–37)
Albumin: 4.4 g/dL (ref 3.5–5.2)
Alkaline Phosphatase: 107 units/L (ref 39–117)
Basophils Absolute: 0 10*3/uL (ref 0.0–0.1)
Basophils Relative: 0 % (ref 0–1)
Eosinophils Absolute: 0.1 10*3/uL (ref 0.0–0.7)
Glucose, Bld: 105 mg/dL — ABNORMAL HIGH (ref 70–99)
HIV 1 RNA Quant: <20 copies/mL (ref <20)
HIV-1 RNA Quant, Log: <1.3 (ref <1.30)
MCHC: 32 g/dL (ref 30.0–36.0)
MCV: 99.7 fL (ref 78.0–100.0)
Monocytes Relative: 10 % (ref 3–12)
Neutro Abs: 2.9 10*3/uL (ref 1.7–7.7)
Neutrophils Relative %: 51 % (ref 43–77)
Platelets: 390 10*3/uL (ref 150–400)
Potassium: 4.3 meq/L (ref 3.5–5.3)
RDW: 13.4 % (ref 11.5–15.5)
Sodium: 141 meq/L (ref 135–145)
Total Bilirubin: 0.3 mg/dL (ref 0.3–1.2)
Total Protein: 7.8 g/dL (ref 6.0–8.3)

## 2010-05-15 LAB — T-HELPER CELL (CD4) - (RCID CLINIC ONLY)
CD4 % Helper T Cell: 14 % — ABNORMAL LOW (ref 33–55)
CD4 T Cell Abs: 310 uL — ABNORMAL LOW (ref 400–2700)

## 2010-05-20 ENCOUNTER — Encounter: Payer: Self-pay | Admitting: Family Medicine

## 2010-05-24 ENCOUNTER — Other Ambulatory Visit: Payer: Self-pay | Admitting: Family Medicine

## 2010-05-24 ENCOUNTER — Other Ambulatory Visit: Payer: Self-pay | Admitting: Obstetrics and Gynecology

## 2010-05-24 ENCOUNTER — Ambulatory Visit
Admission: RE | Admit: 2010-05-24 | Discharge: 2010-05-24 | Payer: Self-pay | Source: Home / Self Care | Attending: Internal Medicine | Admitting: Internal Medicine

## 2010-05-24 ENCOUNTER — Ambulatory Visit
Admission: RE | Admit: 2010-05-24 | Discharge: 2010-05-24 | Payer: Self-pay | Source: Home / Self Care | Attending: Obstetrics and Gynecology | Admitting: Obstetrics and Gynecology

## 2010-05-24 DIAGNOSIS — Z1231 Encounter for screening mammogram for malignant neoplasm of breast: Secondary | ICD-10-CM

## 2010-05-24 DIAGNOSIS — Z139 Encounter for screening, unspecified: Secondary | ICD-10-CM

## 2010-05-25 NOTE — Progress Notes (Unsigned)
NAME:  LODEMA, PARMA NO.:  [id number]  MEDICAL RECORD NO.:  [id number]          PATIENT TYPE:  WOC  LOCATION:  [facility]                   FACILITY:  [facility]  PHYSICIAN:  Argentina Donovan, MD        DATE OF BIRTH:  1931-05-14  DATE OF SERVICE:                                 CLINIC NOTE  The patient is a 77 year old African American female who had a total abdominal hysterectomy in [place] in 1975 for endometrial cancer, had a colposcopy in August 2002 with no sign of malignancy.  She had Pap smear scheduled on several occasions.  She had a Pap smear in May 2011 that was ascus with HPV high-risk.  In January 2011, she had an LSIL Pap.  In July 2010, she had an LSIL Pap.  She is sent in for colposcopy in July.  I thought that was too early since she had previously just had a Pap of few months before which showed ascus with HPV.  The patient is positive for HIV.  She also has hypertension, in today for repeat Pap which was done without any problems.  The patient is HIV, many years post endometrial cancer with atypical Pap.          ______________________________ Argentina Donovan, MD    PR/MEDQ  D:  05/24/2010  T:  05/25/2010  Job:  [id number]

## 2010-05-30 NOTE — Assessment & Plan Note (Signed)
Summary: FU OV/VS   CC:  f/u labs.  History of Present Illness: Karen Luna is having som tests done for her increased creatinine.  She has been feeling well.  She is taking all of her meds every day.  Preventive Screening-Counseling & Management     Alcohol drinks/day: 0     Smoking Status: never   Updated Prior Medication List: CATAPRES 0.2 MG TABS (CLONIDINE HCL) one two times a day BACTRIM DS 800-160 MG TABS (SULFAMETHOXAZOLE-TRIMETHOPRIM) Take 1 tablet by mouth once a day HYDROCHLOROTHIAZIDE 25 MG TABS (HYDROCHLOROTHIAZIDE) Take 1 tablet by mouth once a day CARDIZEM CD 360 MG XR24H-CAP (DILTIAZEM HCL COATED BEADS) Take 1 tablet by mouth once a day SUSTIVA 600 MG TABS (EFAVIRENZ) Take 1 tablet by mouth at bedtime TRUVADA 200-300 MG TABS (EMTRICITABINE-TENOFOVIR) one tablet every other day  Current Allergies (reviewed today): No known allergies  Additional History    Menstrual Status:  postmenopausal  Review of Systems  The patient denies anorexia, fever, and weight loss.    Vital Signs:  Patient profile:   77 year old female Menstrual status:  postmenopausal Height:      66 inches (167.64 cm) Weight:      186.19 pounds (84.63 kg) BMI:     30.16 Temp:     97.2 degrees F (36.22 degrees C) oral Pulse rate:   50 / minute BP sitting:   184 / 86  (left arm)  Vitals Entered By: Starleen Arms CMA (Sep 01, 2008 2:12 PM) CC: f/u labs Is Patient Diabetic? No Pain Assessment Patient in pain? no      Nutritional Status BMI of > 30 = obese Nutritional Status Detail nl  Have you ever been in a relationship where you felt threatened, hurt or afraid?No   Does patient need assistance? Functional Status Self care Ambulation Normal LMP (date): 04/30/1997  years   days  Menstrual Status postmenopausal Last PAP Result ASC-US, +HPV   Physical Exam  General:  alert, well-developed, well-nourished, and well-hydrated.   Head:  normocephalic and atraumatic.   Mouth:  pharynx  pink and moist.   Lungs:  normal breath sounds.      Impression & Recommendations:  Problem # 1:  HIV DISEASE (ICD-042) Good response.  Due to worsening kidney function will change the Atripla to Truvada and Sustiva and dose the Truvada every 48 hours.  She will repeat labs in 3 months. Diagnostics Reviewed:  HIV: REACTIVE (09/12/2006)   HIV-Western blot: Positive (09/12/2006)   CD4: 250 (08/19/2008)   WBC: 4.5 (08/18/2008)   Hgb: 10.5 (08/18/2008)   HCT: 32.5 (08/18/2008)   Platelets: 324 (08/18/2008) HIV-1 RNA: <48 copies/mL (08/18/2008)   HBSAg: NEG (09/12/2006)  Problem # 2:  RENAL INSUFFICIENCY (ICD-588.9) undergoing testing.  i would like to get her back on her cardizem since it controlled her BP better thatn the atenolol.   Byrd Hesselbach will contact her insurance Co.  Problem # 3:  HYPERTENSION, BENIGN ESSENTIAL, UNCONTROLLED (ICD-401.1)  uncontrolled - attempt to change back to cardizem Her updated medication list for this problem includes:    Catapres 0.2 Mg Tabs (Clonidine hcl) ..... One two times a day    Hydrochlorothiazide 25 Mg Tabs (Hydrochlorothiazide) .Marland Kitchen... Take 1 tablet by mouth once a day    Cardizem Cd 360 Mg Xr24h-cap (Diltiazem hcl coated beads) .Marland Kitchen... Take 1 tablet by mouth once a day  BP today: 184/86 Prior BP: 191/81 (06/18/2008)  Labs Reviewed: K+: 4.5 (08/18/2008) Creat: : 1.55 (08/18/2008)  Chol: 185 (07/23/2007)   HDL: 65 (07/23/2007)   LDL: 102 (07/23/2007)   TG: 88 (07/23/2007)  Medications Added to Medication List This Visit: 1)  Sustiva 600 Mg Tabs (Efavirenz) .... Take 1 tablet by mouth at bedtime 2)  Truvada 200-300 Mg Tabs (Emtricitabine-tenofovir) .... One tablet every other day  Other Orders: Est. Patient Level III (64403) Future Orders: T-CD4 ([id number]) ... 11/30/2008 T-HIV Viral Load [id number]) ... 11/30/2008 T-Comprehensive Metabolic Panel [id number]) ... 11/30/2008 T-CBC w/Diff ([id number]) ... 11/30/2008  Patient  Instructions: 1)  Please schedule a follow-up appointment in 3 months, 2 weeks after labs.  Prescriptions: TRUVADA 200-300 MG TABS (EMTRICITABINE-TENOFOVIR) one tablet every other day  #15 x 5   Entered and Authorized by:   Yisroel Ramming MD   Signed by:   Yisroel Ramming MD on 09/01/2008   Method used:   Print then Give to Patient   RxID:   [id number] SUSTIVA 600 MG TABS (EFAVIRENZ) Take 1 tablet by mouth at bedtime  #30 x 5   Entered and Authorized by:   Yisroel Ramming MD   Signed by:   Yisroel Ramming MD on 09/01/2008   Method used:   Print then Give to Patient   RxID:   [id number]

## 2010-05-30 NOTE — Miscellaneous (Signed)
Summary: HCP Continuous Care: Home Health Cert  HCP Continuous Care: Home Health Cert   Imported By: Florinda Marker 01/12/2010 12:10:43  _____________________________________________________________________  External Attachment:    Type:   Image     Comment:   External Document

## 2010-05-30 NOTE — Medication Information (Signed)
Summary: [facility]: Rx  [facility]: Rx   Imported By: Florinda Marker 03/01/2010 11:07:24  _____________________________________________________________________  External Attachment:    Type:   Image     Comment:   External Document

## 2010-05-30 NOTE — Miscellaneous (Signed)
Summary: 08/31/2009 PAP results added to RW Flowsheet    Appended Document: 08/31/2009 PAP results added to RW Flowsheet Next appt. @ [facility] 11/16/2009, 1 PM.

## 2010-05-30 NOTE — Assessment & Plan Note (Signed)
Summary: FU OV   Chief Complaint:  f/u ov.  History of Present Illness: Pt here for f/u.  She has been taking her meds regularly.  Her insurance plan was changed for Medicare and she had some problems getting her Diovan.  She has everything now.    Current Allergies: No known allergies     Risk Factors:  Tobacco use:  quit   Review of Systems  The patient denies anorexia, fever, weight loss, chest pain, and dyspnea on exhertion.     Vital Signs:  Patient Profile:   77 Years Old Female Height:     66 inches (167.64 cm) Weight:      186.9 pounds BMI:     30.28 Temp:     97.4 degrees F oral Pulse rate:   64 / minute BP sitting:   114 / 73  (right arm)  Pt. in pain?   no  Vitals Entered By: Tomasita Morrow RN (May 28, 2007 9:41 AM)              Is Patient Diabetic? No Nutritional Status BMI of > 30 = obese Nutritional Status Detail normal  Have you ever been in a relationship where you felt threatened, hurt or afraid?No  Domestic Violence Intervention none  Does patient need assistance? Functional Status Self care Ambulation Normal Comments No missed doses of HIV meds     Physical Exam  General:     alert, well-developed, well-nourished, and well-hydrated.   Head:     normocephalic and atraumatic.   Mouth:     pharynx pink and moist.  no thrush  Lungs:     normal breath sounds.   Heart:     normal rate, regular rhythm, and no murmur.      Impression & Recommendations:  Problem # 1:  HIV DISEASE (ICD-042) Pt.s most recent CD4ct was 190 and VL <50 .  Pt instructed to continue the current antiretroviral regimen.  Pt encouraged to take medication regularly and not miss doeses.  Pt will f/u in 3 months for repeat blood work and will see me 2 weeks later.  She was given a Hep B vaccine today.  She was instructed to call if she has any problems getting her meds.  Her updated medication list for this problem includes:    Atripla 600-200-300 Mg Tabs  (Efavirenz-emtricitab-tenofovir) .Marland Kitchen... Take 1 tablet by mouth at bedtime    Bactrim Ds 800-160 Mg Tabs (Sulfamethoxazole-trimethoprim) .Marland Kitchen... Take 1 tablet by mouth once a day  Orders: Est. Patient Level III (95621)  Future Orders: T-CBC w/Diff ([id number]) ... 08/25/2007 T-CD4 ([id number]) ... 08/25/2007 T-Comprehensive Metabolic Panel [id number]) ... 08/25/2007 T-HIV Viral Load [id number]) ... 08/25/2007 T-Lipid Profile [id number]) ... 08/25/2007    Patient Instructions: 1)  Please schedule a follow-up appointment in 3 months, 2 weeks after labs.    Prescriptions: CARDIZEM LA 360 MG TB24 (DILTIAZEM HCL COATED BEADS) one once daily  #30 Tablet x 6   Entered and Authorized by:   Yisroel Ramming MD   Signed by:   Yisroel Ramming MD on 05/28/2007   Method used:   Print then Give to Patient   RxID:   [id number]  ]

## 2010-05-30 NOTE — Progress Notes (Signed)
Summary: RX refill  Phone Note Other Incoming   Caller: Leonor Liv, Home Health Nurse Details for Reason: pts. pulse low Summary of Call: [facility] Nurse called saying pts. pulse was 44, B/P 134/96, pt. has no symptoms and said she feels fine.  He checked it several times. Initial call taken by: Wendall Mola CMA Duncan Dull),  September 29, 2009 4:59 PM  Follow-up for Phone Call        I would cut the diltiazem to 120mg  and monitor Follow-up by: Yisroel Ramming MD,  September 30, 2009 10:21 AM  Additional Follow-up for Phone Call Additional follow up Details #1::        Leonor Liv, at [facility] notified.  Pt. notified and is unable to cut the Diltiazem because it is capsule.  Do you want to give her a new RX Additional Follow-up by: Wendall Mola CMA Duncan Dull),  September 30, 2009 12:14 PM    Additional Follow-up for Phone Call Additional follow up Details #2::    yes Follow-up by: Yisroel Ramming MD,  September 30, 2009 2:28 PM  New/Updated Medications: DILTIAZEM HCL CR 120 MG XR12H-CAP (DILTIAZEM HCL) Take 1 tablet by mouth once a day Prescriptions: DILTIAZEM HCL CR 120 MG XR12H-CAP (DILTIAZEM HCL) Take 1 tablet by mouth once a day  #30 x 5   Entered by:   Wendall Mola CMA ( AAMA)   Authorized by:   Yisroel Ramming MD   Signed by:   Wendall Mola CMA ( AAMA) on 09/30/2009   Method used:   Telephoned to ...       [facility] [employer] (mail-order)       [address] [place], [place]  [address]       Ph: [phone]       Fax: [phone]   RxID:   [id number]   Appended Document: RX refill New RX called to [facility], pt. notified to hold off taking Diltiazem 240 mg., until new RX arrives the Diltiazem 120 mg.

## 2010-05-30 NOTE — Assessment & Plan Note (Signed)
Summary: Karen Luna   CC:  f/u labs.  History of Present Illness: Pt here for f/u.  She has not been taking her HIV meds consistently according to her pharmacy profile.  her creatinine improved so I will put her back on Atripla. I spend at least 15 minutes talking to her about her medications. She has no new problems.  Preventive Screening-Counseling & Management  Alcohol-Tobacco     Alcohol drinks/day: 0     Smoking Status: never   Updated Prior Medication List: HYDROCHLOROTHIAZIDE 25 MG TABS (HYDROCHLOROTHIAZIDE) Take 1 tablet by mouth once a day CARTIA XT 180 MG XR24H-CAP (DILTIAZEM HCL COATED BEADS) 1 daily ATRIPLA 600-200-300 MG TABS (EFAVIRENZ-EMTRICITAB-TENOFOVIR) Take 1 tablet by mouth at bedtime  Current Allergies (reviewed today): No known allergies  Review of Systems  The patient denies anorexia, fever, and weight loss.    Vital Signs:  Patient profile:   77 year old female Menstrual status:  postmenopausal Height:      66 inches (167.64 cm) Weight:      191 pounds (86.82 kg) BMI:     30.94 Temp:     97.5 degrees F (36.39 degrees C) oral Pulse rate:   59 / minute BP sitting:   194 / 80  (left arm)  Vitals Entered By: Starleen Arms CMA (December 28, 2008 2:08 PM) CC: f/u labs Is Patient Diabetic? No Pain Assessment Patient in pain? no      Nutritional Status BMI of > 30 = obese Nutritional Status Detail nl  Have you ever been in a relationship where you felt threatened, hurt or afraid?No   Does patient need assistance? Functional Status Self care Ambulation Normal   Physical Exam  General:  alert, well-developed, well-nourished, and well-hydrated.   Head:  normocephalic and atraumatic.   Mouth:  pharynx pink and moist.   Lungs:  normal breath sounds.      Impression & Recommendations:  Problem # 1:  HIV DISEASE (ICD-042) VL went up to 612 - most likely because she was not taking her meds correctly.  Will put her back on Atripla and re-check in  3 months. Hopefully she has not developed resistance. Diagnostics Reviewed:  HIV: REACTIVE (09/12/2006)   HIV-Western blot: Positive (09/12/2006)   CD4: 330 (12/09/2008)   WBC: 6.4 (12/08/2008)   Hgb: 11.5 (12/08/2008)   HCT: 36.6 (12/08/2008)   Platelets: 392 (12/08/2008) HIV-1 RNA: 612 (12/08/2008)   HBSAg: NEG (09/12/2006)  Problem # 2:  HYPERTENSION, BENIGN ESSENTIAL, UNCONTROLLED (ICD-401.1) Her PCP is treating this.  I encouraged her to take her BP meds every day. The following medications were removed from the medication list:    Catapres 0.2 Mg Tabs (Clonidine hcl) ..... One two times a day    Cardizem Cd 360 Mg Xr24h-cap (Diltiazem hcl coated beads) .Marland Kitchen... Take 1 tablet by mouth once a day Her updated medication list for this problem includes:    Hydrochlorothiazide 25 Mg Tabs (Hydrochlorothiazide) .Marland Kitchen... Take 1 tablet by mouth once a day    Cartia Xt 180 Mg Xr24h-cap (Diltiazem hcl coated beads) .Marland Kitchen... 1 daily  Medications Added to Medication List This Visit: 1)  Cartia Xt 180 Mg Xr24h-cap (Diltiazem hcl coated beads) .Marland Kitchen.. 1 daily 2)  Atripla 600-200-300 Mg Tabs (Efavirenz-emtricitab-tenofovir) .... Take 1 tablet by mouth at bedtime  Other Orders: Est. Patient Level III (29562) Future Orders: T-CD4SP ([facility]) (CD4SP) ... 03/28/2009 T-HIV Viral Load [id number]) ... 03/28/2009 T-Comprehensive Metabolic Panel [id number]) ... 03/28/2009 T-CBC w/Diff ([id number]) ... 03/28/2009  Patient Instructions: 1)  Please schedule a follow-up appointment in 3 months,2 weeks after labs.  Prescriptions: ATRIPLA 600-200-300 MG TABS (EFAVIRENZ-EMTRICITAB-TENOFOVIR) Take 1 tablet by mouth at bedtime  #30 x 5   Entered and Authorized by:   Yisroel Ramming MD   Signed by:   Yisroel Ramming MD on 12/28/2008   Method used:   Print then Give to Patient   RxID:   [id number]  Process Orders Check Orders Results:     [facility]: Check successful Tests Sent for requisitioning  (December 28, 2008 2:26 PM):     03/28/2009: [facility] -- T-HIV Viral Load [id number] (signed)     03/28/2009: [facility] -- T-Comprehensive Metabolic Panel [[id number]] (signed)     03/28/2009: [facility] -- [facility] w/Diff [[id number]] (signed)

## 2010-05-30 NOTE — Assessment & Plan Note (Signed)
Summary: 4 WEEK/FU/CFB   CC:  follow-up visit and c/o blood pressure high at times.  History of Present Illness: Pt here for lab results. She states that she has been taking her meds every day.  Preventive Screening-Counseling & Management  Alcohol-Tobacco     Alcohol drinks/day: 0     Smoking Status: never     Year Quit: 1985     Pack years: 1 ppd     Passive Smoke Exposure: no  Caffeine-Diet-Exercise     Caffeine use/day: coffee,tea     Does Patient Exercise: yes     Type of exercise: walking     Times/week: 5  Hep-HIV-STD-Contraception     HIV Risk: no  Safety-Violence-Falls     Seat Belt Use: yes      Drug Use:  no.     Updated Prior Medication List: DIOVAN HCT 320-25 MG TABS (VALSARTAN-HYDROCHLOROTHIAZIDE) Take 1 tablet by mouth once a day DILTIAZEM HCL ER BEADS 240 MG XR24H-CAP (DILTIAZEM HCL ER BEADS) Take 1 capsule by mouth once a day CLONIDINE HCL 0.1 MG TABS (CLONIDINE HCL) Take 1 tablet by mouth two times a day TRUVADA 200-300 MG TABS (EMTRICITABINE-TENOFOVIR) Take 1 tablet by mouth once a day ISENTRESS 400 MG TABS (RALTEGRAVIR POTASSIUM) Take 1 tablet by mouth two times a day  Current Allergies (reviewed today): No known allergies  Review of Systems  The patient denies anorexia, fever, weight loss, chest pain, and dyspnea on exertion.    Vital Signs:  Patient profile:   77 year old female Menstrual status:  postmenopausal Height:      66 inches (167.64 cm) Weight:      195.4 pounds (88.82 kg) BMI:     31.65 Temp:     97.0 degrees F (36.11 degrees C) oral Pulse rate:   54 / minute BP sitting:   166 / 76  (left arm)  Vitals Entered By: Wendall Mola CMA Duncan Dull) (July 06, 2009 9:42 AM) CC: follow-up visit, c/o blood pressure high at times Is Patient Diabetic? No Pain Assessment Patient in pain? no      Nutritional Status BMI of > 30 = obese Nutritional Status Detail appetite "good"  Does patient need assistance? Functional Status  Self care Ambulation Normal Comments no missed doses of meds per patient   Physical Exam  General:  alert, well-developed, well-nourished, and well-hydrated.   Head:  normocephalic and atraumatic.   Mouth:  pharynx pink and moist.  no thrush  Lungs:  normal breath sounds.      Impression & Recommendations:  Problem # 1:  HIV DISEASE (ICD-042) VL up will switch her HIV therapy to Truvada and Isentress.  Potential side effects discussed. She will return in 6 weeks for repeat labs.  hep B vaccine # 1 given today. Diagnostics Reviewed:  HIV: REACTIVE (09/12/2006)   HIV-Western blot: Positive (09/12/2006)   CD4: 240 (05/26/2009)   WBC: 5.0 (05/25/2009)   Hgb: 11.4 (05/25/2009)   HCT: 34.9 (05/25/2009)   Platelets: 367 (05/25/2009) HIV genotype: * (03/16/2009)   HIV-1 RNA: 20800 (05/25/2009)   HBSAg: NEG (09/12/2006)  Problem # 2:  HYPERTENSION, BENIGN ESSENTIAL, UNCONTROLLED (ICD-401.1) Assessment: Improved  Her updated medication list for this problem includes:    Diovan Hct 320-25 Mg Tabs (Valsartan-hydrochlorothiazide) .Marland Kitchen... Take 1 tablet by mouth once a day    Diltiazem Hcl Er Beads 240 Mg Xr24h-cap (Diltiazem hcl er beads) .Marland Kitchen... Take 1 capsule by mouth once a day    Clonidine Hcl  0.1 Mg Tabs (Clonidine hcl) .Marland Kitchen... Take 1 tablet by mouth two times a day  Medications Added to Medication List This Visit: 1)  Truvada 200-300 Mg Tabs (Emtricitabine-tenofovir) .... Take 1 tablet by mouth once a day 2)  Isentress 400 Mg Tabs (Raltegravir potassium) .... Take 1 tablet by mouth two times a day  Other Orders: Est. Patient Level III (40981) Hepatitis B Vaccine >23yrs (19147) Admin 1st Vaccine (82956) Future Orders: T-CD4SP ([facility]) (CD4SP) ... 08/17/2009 T-HIV Viral Load [id number]) ... 08/17/2009 T-Comprehensive Metabolic Panel [id number]) ... 08/17/2009 T-CBC w/Diff ([id number]) ... 08/17/2009  Patient Instructions: 1)  Please schedule a follow-up appointment in 8  weeks.  Prescriptions: ISENTRESS 400 MG TABS (RALTEGRAVIR POTASSIUM) Take 1 tablet by mouth two times a day  #60 x 5   Entered and Authorized by:   Yisroel Ramming MD   Signed by:   Yisroel Ramming MD on 07/06/2009   Method used:   Print then Give to Patient   RxID:   [id number] TRUVADA 200-300 MG TABS (EMTRICITABINE-TENOFOVIR) Take 1 tablet by mouth once a day  #30 x 5   Entered and Authorized by:   Yisroel Ramming MD   Signed by:   Yisroel Ramming MD on 07/06/2009   Method used:   Print then Give to Patient   RxID:   [id number]  Process Orders Check Orders Results:     [facility]: Check successful Tests Sent for requisitioning (July 06, 2009 2:34 PM):     08/17/2009: [facility] -- T-HIV Viral Load [id number] (signed)     08/17/2009: [facility] -- T-Comprehensive Metabolic Panel [[id number]] (signed)     08/17/2009: [facility] -- T-CBC w/Diff [[id number]] (signed)     Immunizations Administered:  Hepatitis B Vaccine # 1:    Vaccine Type: HepB Adult    Site: right deltoid    Mfr: Merck    Dose: 1.0 ml    Route: IM    Given by: Tomasita Morrow RN    Exp. Date: 02/06/2011    Lot #: [id number]    VIS given: 11/14/05 version given July 06, 2009.

## 2010-05-30 NOTE — Letter (Signed)
Summary: Generic Letter  [facility]  [address]   [place], [place] [address]   Phone: [phone]  Fax: [phone]         05/21/2008  [facility] Insurance Plan Ref: Karen Luna ID: [id number]  Dear Representative:  We have received a prior authorization request from patient's local pharmacy for a specialty medication. We were asked to provide the following information: diagnosis, dosage of medication, duration of therapy, and clinical evidence on why patient is needing this medication.  Ms. Karen Luna's diagnosis is 64 (HIV disease). The dosage of medication requested is Atripla 600mg /200mg /300mg  and the duration of therapy is indefinite. Attached is clinical evidence on why patient is in need of this particular medication. Please feel free to contact our office if you have any additional questions or concerns.  Sincerely,    Dr. [facility] [phone]  mld

## 2010-05-30 NOTE — Assessment & Plan Note (Signed)
Summary: B/P elevated/dde  Nurse Visit   Vital Signs:  Patient Profile:   77 Years Old Female CC:      B/P elevated at [facility] this AM.  Advised pt. to come to [facility] to have B/P rechecked and evaluated  Height:     66 inches (167.64 cm) Weight:      187.8 pounds (85.36 kg) BMI:     30.42 Temp:     98.1 degrees F (36.72 degrees C) oral Pulse rate:   75 / minute Pulse (ortho):   81 / minute BP sitting:   239 / 97  (left arm) BP standing:   225 / 113  Vitals Entered By: Jennet Maduro RN (May 27, 2008 2:23 PM)             Nutritional Status BMI of > 30 = obese     Serial Vital Signs/Assessments:  Time      Position  BP       Pulse  Resp  Temp     By 2:25 PM   R Arm     239/95                         Jennet Maduro RN 2:35 PM   Lying RA  217/102  70                    Jennet Maduro RN 2:35 PM   Sitting   242/105                        Jennet Maduro RN 2:35 PM   Standing  225/113  81                    Jennet Maduro RN  Comments: 2:25 PM Took atenolol tablet @ 0900 this AM. Jennet Maduro RN  May 27, 2008 2:26 PM  By: Jennet Maduro RN  Correction:  The pt. did NOT take atenolol tablet this morning.  She took clonidine and HCTZ tablets this AM.  She has NOT started taking atenolol.  Atenolol was phoned in to [facility] this AM by M. Dupree.  Pt. will take first dose of Atenolol this afternoon after picking up prescription from [facility].  RN discussed taking all three medications in the AM.  Provided medication list to pt. and her son who accompanied the pt. to this visit.  RN went over each medication and how and when to take it.  Pt. and son requested information on "signs and symptoms" of a stroke.  Provided pt. and son with Care Notes for "ischemic stroke" which provided sign and symptoms and prevention information.  The pt. and her son verbalized back this information.  Phone call to Dr. Philipp Deputy at [facility].  Discussed findings.  Dr. Philipp Deputy  agreed that pt. should take Atenolol this afternoon and return in one week for B/P recheck. Jennet Maduro RN  May 27, 2008 3:50 PM   Patient Instructions: 1)  Please schedule a follow-up appointment in 1 week for B/P recheck.   Prior Medications: CATAPRES 0.2 MG TABS (CLONIDINE HCL) one two times a day ATRIPLA 600-200-300 MG TABS (EFAVIRENZ-EMTRICITAB-TENOFOVIR) Take 1 tablet by mouth at bedtime BACTRIM DS 800-160 MG TABS (SULFAMETHOXAZOLE-TRIMETHOPRIM) Take 1 tablet by mouth once a day HYDROCHLOROTHIAZIDE 25 MG TABS (HYDROCHLOROTHIAZIDE) Take 1 tablet by mouth once a day ATENOLOL 100 MG TABS (ATENOLOL) Take 1 tablet by mouth once  a day Current Allergies: No known allergies       ]  Appended Document: B/P elevated/dde pt did not start atenolol yet - will start today and have her BP checked in 1 week

## 2010-05-30 NOTE — Progress Notes (Signed)
Summary: PPD  Phone Note Outgoing Call   Call placed by: Annice Pih Summary of Call: Pt. needs PPD at next office visit Initial call taken by: Wendall Mola CMA Duncan Dull),  January 16, 2010 12:49 PM

## 2010-05-30 NOTE — Consult Note (Signed)
Summary: [facility]   Imported By: Florinda Marker 12/13/2009 13:27:08  _____________________________________________________________________  External Attachment:    Type:   Image     Comment:   External Document

## 2010-05-30 NOTE — Progress Notes (Signed)
Summary: call from Home Health  Phone Note Other Incoming   Caller: Rhonda from [facility] Summary of Call: Bjorn Loser from [facility], pt. is still having bradycardia, per Dr. Philipp Deputy I got her a f/u appt with Dr. Chales Abrahams at [facility] for this Friday 01/13/10 at 4:00 pm.  Deboraha Sprang is aware that if pts. son can not bring her that day they are to call and reschedule.  Bjorn Loser will call and notify pt. Initial call taken by: Wendall Mola CMA Duncan Dull),  January 11, 2010 10:11 AM  Follow-up for Phone Call        ok Follow-up by: Yisroel Ramming MD,  January 11, 2010 11:22 AM

## 2010-05-30 NOTE — Miscellaneous (Signed)
Summary: Orders Update  Clinical Lists Changes  Problems: Added new problem of SINUS BRADYCARDIA (ICD-427.81) - Signed Orders: Added new Referral order of Cardiology Referral (Cardiology) - Signed Added new Referral order of Cardiology Referral (Cardiology) - Signed

## 2010-05-30 NOTE — Progress Notes (Signed)
Summary: update on pt.  Phone Note Other Incoming   Caller: Rhonda, Home Health Summary of Call: [place], [facility] called to report pts. B/P 180/98 and pulse 48.  Pt. is not symptomatic.  Called [facility] for Holter Monitor results, will fax to clinic Initial call taken by: Wendall Mola CMA Duncan Dull),  November 18, 2009 12:09 PM

## 2010-05-30 NOTE — Miscellaneous (Signed)
Summary: RW flowsheet update - 05/28/2008 PAP smear results  Clinical Lists Changes  Observations: Added new observation of PAP SMEAR: ASC-US, +HPV (05/27/2008 16:29) Added new observation of LAST PAP DAT: 05/27/2008 (05/27/2008 16:29)

## 2010-05-30 NOTE — Assessment & Plan Note (Signed)
Summary: 2WK F/U/VS   CC:  follow-up visit and lab results.  History of Present Illness: Pt doing well. She was taken off her Calcium blocker for bradycardia. Rhonda if her adherence counselor and has been following her.  Preventive Screening-Counseling & Management  Alcohol-Tobacco     Alcohol drinks/day: 0     Smoking Status: quit     Packs/Day: 00     Year Quit: 1985     Pack years: 1 ppd     Passive Smoke Exposure: no  Caffeine-Diet-Exercise     Caffeine use/day: coffee one per day     Does Patient Exercise: yes     Type of exercise: walking     Times/week: 5  Hep-HIV-STD-Contraception     HIV Risk: no  Safety-Violence-Falls     Seat Belt Use: yes      Drug Use:  no.    Comments: pt. declined condoms   Updated Prior Medication List: TRUVADA 200-300 MG TABS (EMTRICITABINE-TENOFOVIR) Take 1 tablet by mouth once a day ISENTRESS 400 MG TABS (RALTEGRAVIR POTASSIUM) Take 1 tablet by mouth two times a day NORVASC 5 MG TABS (AMLODIPINE BESYLATE) Take 1 tablet by mouth once a day DIOVAN HCT 320-25 MG TABS (VALSARTAN-HYDROCHLOROTHIAZIDE) Take 1 tablet by mouth once a day CLONIDINE HCL 0.2 MG TABS (CLONIDINE HCL) Take 1 tablet by mouth two times a day  Current Allergies (reviewed today): No known allergies  Review of Systems  The patient denies anorexia, fever, and weight loss.    Vital Signs:  Patient profile:   77 year old female Menstrual status:  postmenopausal Height:      66 inches (167.64 cm) Weight:      185.8 pounds (92.73 kg) BMI:     30.10 Temp:     98.3 degrees F (36.72 degrees C) oral Pulse rate:   59 / minute BP sitting:   184 / 82  (left arm)  Vitals Entered By: Wendall Mola CMA Duncan Dull) (February 01, 2010 10:44 AM) CC: follow-up visit, lab results Is Patient Diabetic? No Pain Assessment Patient in pain? no      Nutritional Status BMI of > 30 = obese Nutritional Status Detail appetite "good"  Have you ever been in a relationship  where you felt threatened, hurt or afraid?No   Does patient need assistance? Functional Status Self care Ambulation Normal Comments no missed doses of meds per pt.   Physical Exam  General:  alert, well-developed, well-nourished, and well-hydrated.   Head:  normocephalic and atraumatic.   Lungs:  normal breath sounds.   Heart:  regular rhythm and bradycardia.     Impression & Recommendations:  Problem # 1:  HIV DISEASE (ICD-042) Pt.s most recent CD4ct was 280 and VL <20 .  Pt instructed to continue the current antiretroviral regimen.  Pt encouraged to take medication regularly and not miss doses.  Pt will f/u in 3 months for repeat blood work and will see me 2 weeks later.  Diagnostics Reviewed:  HIV: REACTIVE (09/12/2006)   HIV-Western blot: Positive (09/12/2006)   CD4: 280 (01/19/2010)   WBC: 5.9 (01/18/2010)   Hgb: 11.4 (01/18/2010)   HCT: 35.5 (01/18/2010)   Platelets: 400 (01/18/2010) HIV genotype: * (03/16/2009)   HIV-1 RNA: <20 copies/mL (01/18/2010)   HBSAg: NEG (09/12/2006)  Problem # 2:  SINUS BRADYCARDIA (ICD-427.81) Cardiology following  Problem # 3:  HYPERTENSION, BENIGN ESSENTIAL, UNCONTROLLED (ICD-401.1) increase clonidine dose The following medications were removed from the medication list:  Clonidine Hcl 0.1 Mg Tabs (Clonidine hcl) .Marland Kitchen... Take 1 tablet by mouth two times a day Her updated medication list for this problem includes:    Norvasc 5 Mg Tabs (Amlodipine besylate) .Marland Kitchen... Take 1 tablet by mouth once a day    Diovan Hct 320-25 Mg Tabs (Valsartan-hydrochlorothiazide) .Marland Kitchen... Take 1 tablet by mouth once a day    Clonidine Hcl 0.2 Mg Tabs (Clonidine hcl) .Marland Kitchen... Take 1 tablet by mouth two times a day  Medications Added to Medication List This Visit: 1)  Diovan Hct 320-25 Mg Tabs (Valsartan-hydrochlorothiazide) .... Take 1 tablet by mouth once a day 2)  Clonidine Hcl 0.2 Mg Tabs (Clonidine hcl) .... Take 1 tablet by mouth two times a day  Other Orders: Est.  Patient Level III (62130) Influenza Vaccine MCR (86578) TB Skin Test (46962) Admin 1st Vaccine (95284) Future Orders: T-CD4SP ([facility]) (CD4SP) ... 05/02/2010 T-HIV Viral Load [phone]) ... 05/02/2010 T-Comprehensive Metabolic Panel [phone]) ... 05/02/2010 T-CBC w/Diff ([id number]) ... 05/02/2010  Patient Instructions: 1)  Please schedule a follow-up appointment in 3 months, 2 weeks.  Prescriptions: CLONIDINE HCL 0.2 MG TABS (CLONIDINE HCL) Take 1 tablet by mouth two times a day  #60 x 5   Entered and Authorized by:   Yisroel Ramming MD   Signed by:   Yisroel Ramming MD on 02/01/2010   Method used:   Print then Give to Patient   RxID:   [id number]  Rx called to [facility] Wendall Mola CMA Duncan Dull)  February 01, 2010 11:46 AM      Immunizations Administered:  Influenza Vaccine # 1:    Vaccine Type: Fluvax MCR    Site: right deltoid    Mfr: Novartis    Dose: 0.5 ml    Route: IM    Given by: Wendall Mola CMA ( AAMA)    Exp. Date: 07/30/2010    Lot #: [id number]    VIS given: 11/22/09 version given February 01, 2010.  PPD Skin Test:    Vaccine Type: PPD    Site: left forearm    Mfr: Sanofi Pasteur    Dose: 0.1 ml    Route: ID    Given by: Wendall Mola CMA ( AAMA)  Flu Vaccine Consent Questions:    Do you have a history of severe allergic reactions to this vaccine? no    Any prior history of allergic reactions to egg and/or gelatin? no    Do you have a sensitivity to the preservative Thimersol? no    Do you have a past history of Guillan-Barre Syndrome? no    Do you currently have an acute febrile illness? no    Have you ever had a severe reaction to latex? no    Vaccine information given and explained to patient? yes    Are you currently pregnant? no

## 2010-05-30 NOTE — Assessment & Plan Note (Signed)
Summary: CHECKUP/SB.   CC:  f/u visit.  History of Present Illness: Pt here for f/u. She states that she has not missed any doses of her HIV meds.  Preventive Screening-Counseling & Management  Alcohol-Tobacco     Alcohol drinks/day: 0     Smoking Status: never     Year Quit: 1985     Pack years: 1 ppd     Passive Smoke Exposure: no  Caffeine-Diet-Exercise     Caffeine use/day: coffee,tea     Does Patient Exercise: yes     Type of exercise: walking     Times/week: 5   Updated Prior Medication List: ATRIPLA 600-200-300 MG TABS (EFAVIRENZ-EMTRICITAB-TENOFOVIR) Take 1 tablet by mouth at bedtime DIOVAN HCT 320-25 MG TABS (VALSARTAN-HYDROCHLOROTHIAZIDE) Take 1 tablet by mouth once a day DILTIAZEM HCL ER BEADS 240 MG XR24H-CAP (DILTIAZEM HCL ER BEADS) Take 1 capsule by mouth once a day CLONIDINE HCL 0.1 MG TABS (CLONIDINE HCL) Take 1 tablet by mouth two times a day  Current Allergies (reviewed today): No known allergies  Review of Systems  The patient denies anorexia, fever, weight loss, chest pain, dyspnea on exertion, and headaches.    Vital Signs:  Patient profile:   77 year old female Menstrual status:  postmenopausal Height:      66 inches (167.64 cm) Weight:      194.38 pounds (88.35 kg) BMI:     31.49 Pulse rate:   59 / minute BP sitting:   197 / 81  (left arm)  Vitals Entered By: Starleen Arms CMA (April 13, 2009 10:02 AM) CC: f/u visit Is Patient Diabetic? No Pain Assessment Patient in pain? no      Nutritional Status BMI of > 30 = obese Nutritional Status Detail nl  Does patient need assistance? Functional Status Self care Ambulation Normal Comments no missed doses   Physical Exam  General:  alert, well-developed, well-nourished, and well-hydrated.   Head:  normocephalic and atraumatic.   Lungs:  normal breath sounds.   Heart:  normal rate and regular rhythm.      Impression & Recommendations:  Problem # 1:  HIV DISEASE  (ICD-042) Genotype does not show any resistance to current meds.  I will repeat her labs in 6 weeks to see if her VL comes down.  She is encouraged to take her meds every day. Diagnostics Reviewed:  HIV: REACTIVE (09/12/2006)   HIV-Western blot: Positive (09/12/2006)   CD4: 260 (03/03/2009)   WBC: 5.2 (03/02/2009)   Hgb: 11.9 (03/02/2009)   HCT: 37.5 (03/02/2009)   Platelets: 355 (03/02/2009) HIV genotype: * (03/16/2009)   HIV-1 RNA: 19700 (03/02/2009)   HBSAg: NEG (09/12/2006)  Problem # 2:  HYPERTENSION, BENIGN ESSENTIAL, UNCONTROLLED (ICD-401.1)  I will add clonidine and re-check her BP. Her updated medication list for this problem includes:    Diovan Hct 320-25 Mg Tabs (Valsartan-hydrochlorothiazide) .Marland Kitchen... Take 1 tablet by mouth once a day    Diltiazem Hcl Er Beads 240 Mg Xr24h-cap (Diltiazem hcl er beads) .Marland Kitchen... Take 1 capsule by mouth once a day    Clonidine Hcl 0.1 Mg Tabs (Clonidine hcl) .Marland Kitchen... Take 1 tablet by mouth two times a day  BP today: 197/81 Prior BP: 202/92 (03/16/2009)  Labs Reviewed: K+: 4.5 (03/02/2009) Creat: : 1.00 (03/02/2009)   Chol: 185 (07/23/2007)   HDL: 65 (07/23/2007)   LDL: 102 (07/23/2007)   TG: 88 (07/23/2007)  Medications Added to Medication List This Visit: 1)  Clonidine Hcl 0.1 Mg Tabs (Clonidine hcl) .Marland KitchenMarland KitchenMarland Kitchen  Take 1 tablet by mouth two times a day  Other Orders: Est. Patient Level III (16109) Future Orders: T-CD4SP ([facility]) (CD4SP) ... 05/25/2009 T-HIV Viral Load [phone]) ... 05/25/2009 T-Comprehensive Metabolic Panel [id number]) ... 05/25/2009 T-CBC w/Diff ([id number]) ... 05/25/2009 T-RPR (Syphilis) [phone]) ... 05/25/2009  Patient Instructions: 1)  Please schedule a follow-up appointment in 8 weeks.  Prescriptions: CLONIDINE HCL 0.1 MG TABS (CLONIDINE HCL) Take 1 tablet by mouth two times a day  #60 x 5   Entered and Authorized by:   Yisroel Ramming MD   Signed by:   Yisroel Ramming MD on 04/13/2009   Method used:   Print then Give  to Patient   RxID:   [id number]  Process Orders Check Orders Results:     [facility]: Check successful Tests Sent for requisitioning (April 13, 2009 10:15 AM):     05/25/2009: [facility] -- T-HIV Viral Load [phone] (signed)     05/25/2009: [facility] -- T-Comprehensive Metabolic Panel [[id number]] (signed)     05/25/2009: [facility] -- T-CBC w/Diff [[id number]] (signed)     05/25/2009: [facility] -- T-RPR (Syphilis) [phone] (signed)

## 2010-05-30 NOTE — Assessment & Plan Note (Signed)
Summary: 37month f/u/vs   CC:  follow-up visit, lab results, B/P very elevated, and has only been taking Diovan HCT.  History of Present Illness: Pt here for f/u. She feels fine. According to her pharmacy print out she has been taking the Atripal and Truvada and Isentess. I showed her the pill and told her to stop the Atripla but to continue the others. She has also been taking only one of her BP meds. [facility] we can get her straight with her meds.  Preventive Screening-Counseling & Management  Alcohol-Tobacco     Alcohol drinks/day: 0     Smoking Status: never     Year Quit: 1985     Pack years: 1 ppd     Passive Smoke Exposure: no  Caffeine-Diet-Exercise     Caffeine use/day: coffee one per day     Does Patient Exercise: yes     Type of exercise: walking     Times/week: 5  Hep-HIV-STD-Contraception     HIV Risk: no  Safety-Violence-Falls     Seat Belt Use: yes      Drug Use:  no.     Updated Prior Medication List: DIOVAN HCT 320-25 MG TABS (VALSARTAN-HYDROCHLOROTHIAZIDE) Take 1 tablet by mouth once a day DILTIAZEM HCL ER BEADS 240 MG XR24H-CAP (DILTIAZEM HCL ER BEADS) Take 1 capsule by mouth once a day CLONIDINE HCL 0.1 MG TABS (CLONIDINE HCL) Take 1 tablet by mouth two times a day TRUVADA 200-300 MG TABS (EMTRICITABINE-TENOFOVIR) Take 1 tablet by mouth once a day ISENTRESS 400 MG TABS (RALTEGRAVIR POTASSIUM) Take 1 tablet by mouth two times a day  Current Allergies (reviewed today): No known allergies  Review of Systems  The patient denies anorexia, fever, and weight loss.    Vital Signs:  Patient profile:   77 year old female Menstrual status:  postmenopausal Height:      66 inches (167.64 cm) Weight:      191.8 pounds (87.18 kg) BMI:     31.07 Temp:     97.9 degrees F (36.61 degrees C) oral Pulse rate:   58 / minute BP sitting:   217 / 105  (left arm)  Vitals Entered By: Wendall Mola CMA Duncan Dull) (Aug 31, 2009 9:56 AM) CC: follow-up visit, lab results, B/P very elevated, has only been taking Diovan HCT Is Patient Diabetic? No Pain Assessment Patient in pain? no      Nutritional Status BMI of > 30 = obese Nutritional Status Detail appetite "good"  Does patient need assistance? Functional Status Self care Ambulation Normal Comments pt. has not been taking Diltiazem or clonidine for B/P   Physical Exam  General:  alert, well-developed, well-nourished, and well-hydrated.   Head:  normocephalic and atraumatic.   Mouth:  pharynx pink and moist.   Lungs:  normal breath sounds.      Impression & Recommendations:  Problem # 1:  HIV DISEASE (ICD-042) Pt.s most recent CD4ct was 270 and VL<48  .  Pt instructed to continue the current antiretroviral regimen.  Pt encouraged to take medication regularly and not miss doses. Pt to stop Atripla. Pt will f/u in 3 months for repeat blood work and will see me 2 weeks later.  Diagnostics Reviewed:  HIV: REACTIVE (09/12/2006)   HIV-Western blot: Positive (09/12/2006)   CD4: 270 (08/17/2009)   WBC: 6.0 (08/17/2009)   Hgb: 11.7 (08/17/2009)   HCT: 36.9 (08/17/2009)   Platelets: 383 (08/17/2009) HIV genotype: * (  03/16/2009)   HIV-1 RNA: <48 copies/mL (08/17/2009)   HBSAg: NEG (09/12/2006)  Problem # 2:  HYPERTENSION, BENIGN ESSENTIAL, UNCONTROLLED (ICD-401.1) clonidine 0.1mg  by mouth now refer to PPA to help her with her meds. Her updated medication list for this problem includes:    Diovan Hct 320-25 Mg Tabs (Valsartan-hydrochlorothiazide) .Marland Kitchen... Take 1 tablet by mouth once a day    Diltiazem Hcl Er Beads 240 Mg Xr24h-cap (Diltiazem hcl er beads) .Marland Kitchen... Take 1 capsule by mouth once a day    Clonidine Hcl 0.1 Mg Tabs (Clonidine hcl) .Marland Kitchen... Take 1 tablet by mouth two times a day  Orders: Clonidine 0.1mg  tab [facility])  Other Orders: Est. Patient Level IV (16109) T-PAP [facility]) [phone]) Future Orders: T-CD4SP ([facility]) (CD4SP) ... 11/29/2009 T-HIV  Viral Load [phone]) ... 11/29/2009 T-Comprehensive Metabolic Panel [id number]) ... 11/29/2009 T-CBC w/Diff ([id number]) ... 11/29/2009 T-Lipid Profile [phone]) ... 11/29/2009  Patient Instructions: 1)  Please schedule a follow-up appointment in 3 months, 2 weeks after labs.    Medication Administration  Medication # 1:    Medication: Clonidine 0.1mg  tab    Diagnosis: HYPERTENSION, BENIGN ESSENTIAL, UNCONTROLLED (ICD-401.1)    Dose: 1 tablet    Route: po    Exp Date: 04/12    Lot #: [id number]    Mfr: [employer]    Given by: Wendall Mola CMA Duncan Dull) (Aug 31, 2009 10:21 AM)  Orders Added: 1)  T-CD4SP [facility]) [CD4SP] 2)  T-HIV Viral Load [id number] 3)  T-Comprehensive Metabolic Panel [[id number]] 4)  T-CBC w/Diff [[id number]] 5)  T-Lipid Profile [[id number]] 6)  Est. Patient Level IV [33295] 7)  Clonidine 0.1mg  tab [EMRORAL] 8)  T-PAP [facility]) [18841]    Process Orders Check Orders Results:     [facility]: Order checked:     22930 -- T-Lipid Profile -- ABN required due to diagnosis (CPT: 80061) Tests Sent for requisitioning (Sep 01, 2009 11:00 AM):     11/29/2009: [facility] -- T-HIV Viral Load [phone] (signed)     11/29/2009: [facility] -- T-Comprehensive Metabolic Panel [[id number]] (signed)     11/29/2009: [facility] -- T-CBC w/Diff [[id number]] (signed)     11/29/2009: [facility] -- T-Lipid Profile [phone] (signed)

## 2010-05-30 NOTE — Progress Notes (Signed)
Summary: update on patient  Phone Note Other Incoming   Caller: [facility] [phone] Details for Reason: update on pt. Summary of Call: Leonor Liv called after seeing pt.  B/P sitting R-148/98  L-152/92, pulse 50  B/P standing 162/108, pulse 66.  Pt. told Leonor Liv that her sister has a history of enlarged heart and her mother has a pacemaker.  Just wanted you to be aware. Initial call taken by: Wendall Mola CMA Duncan Dull),  October 05, 2009 4:40 PM  Follow-up for Phone Call        schedule for holter monitor Follow-up by: Yisroel Ramming MD,  October 06, 2009 10:12 AM  Additional Follow-up for Phone Call Additional follow up Details #1::        Holter Monitor appt. scheduled for Wednesday 10/12/09 at 10:00 AM at [facility].  Pt. aware Additional Follow-up by: Wendall Mola CMA Duncan Dull),  October 06, 2009 2:33 PM

## 2010-05-30 NOTE — Miscellaneous (Signed)
Summary: Pt. needs repeat PAP @ [facility], Nov. 2011 per [facility]  Clinical Lists Changes Pt. No Showed for COLPO appt. at [facility] on 10/14/2009.  She did show up for appt. on 11/10/2009 which was supposed to be to receive results from COLPO.  Pt. left [facility] without an appt. to return for COLPO. Jennet Maduro RN  November 17, 2009 2:47 PM  Phone call to [facility].  Requested records from most recent pt. contact.  Notes stated that the pt. had a PAP in May 2011.  Too soon for next PAP.  Pt. to return to clinic in 6 months for next PAP smear.  Jennet Maduro RN  November 17, 2009 3:01 PM

## 2010-05-30 NOTE — Miscellaneous (Signed)
Summary: 11/05/2008 PAP results = CIN1  Clinical Lists Changes

## 2010-05-30 NOTE — Miscellaneous (Signed)
Summary: RW Flowsheet update - 05/27/2008 PAP = ASC-US  Clinical Lists Changes  Observations: Added new observation of PAP SMEAR: ABNORMAL = ASC-US (05/27/2008 9:46) Added new observation of LAST PAP DAT: 05/27/2009 (05/27/2008 9:46)

## 2010-05-30 NOTE — Miscellaneous (Signed)
Summary: [facility]  [facility]   Imported By: Florinda Marker 02/20/2010 16:19:03  _____________________________________________________________________  External Attachment:    Type:   Image     Comment:   External Document

## 2010-05-30 NOTE — Progress Notes (Signed)
Summary: update from Home Health  Phone Note Other Incoming   Caller: [facility] Nurse Summary of Call: Bjorn Loser went out to see pt.  She had not stopped Ditiazem and started Amlodipine per Dr. Anne Fu (Cardiologist)  I called in the Norvasc to [facility] at [phone].  Pts. pulse was 42,  B/P 180/98.  Pt. was not reporting any symptoms. Initial call taken by: Wendall Mola CMA [facility]),  December 16, 2009 11:40 AM

## 2010-05-30 NOTE — Miscellaneous (Signed)
Summary: PPD results  Clinical Lists Changes  Observations: Added new observation of TB PPDRESULT: negative (02/10/2010 15:56) Added new observation of PPD RESULT: < 5mm (02/10/2010 15:56) Added new observation of TB-PPD RDDTE: 02/03/2010 (02/10/2010 15:56)      PPD Results    Date of reading: 02/03/2010    Results: < 5mm    Interpretation: negative

## 2010-05-30 NOTE — Progress Notes (Signed)
Summary: [facility] req[facility]t for pt.  Phone Note From Other Clinic   Summary of Call: [facility] called Monia Sabal. At [facility] stating pt was there with highly elevated BP and needed eval next week. [place] attempted to call the pt at above phone number which is disconnected.  We have assigned pt to appt on 11-09-08 with Dr Philipp Deputy. We will attempt to reach pt via phone on Monday for Tuesday appt. Tomasita Morrow RN  November 05, 2008 4:53 PM

## 2010-05-30 NOTE — Miscellaneous (Signed)
Summary: HCP [facility]  HCP [facility]   Imported By: Florinda Marker 09/23/2009 11:08:28  _____________________________________________________________________  External Attachment:    Type:   Image     Comment:   External Document

## 2010-05-30 NOTE — Letter (Signed)
Summary: [facility]: Notes  [facility]: Notes   Imported By: Florinda Marker 11/23/2009 09:15:07  _____________________________________________________________________  External Attachment:    Type:   Image     Comment:   External Document

## 2010-05-30 NOTE — Assessment & Plan Note (Signed)
Summary: F/U/VS   CC:  f/u labs.  History of Present Illness: Pt feeling well. She states that she has been taking her Atripla every day.  She stopped her Diovan because she did not have any refills.  No HA or CP.   Updated Prior Medication List: ATRIPLA 600-200-300 MG TABS (EFAVIRENZ-EMTRICITAB-TENOFOVIR) Take 1 tablet by mouth at bedtime DIOVAN HCT 320-25 MG TABS (VALSARTAN-HYDROCHLOROTHIAZIDE) Take 1 tablet by mouth once a day DILTIAZEM HCL ER BEADS 240 MG XR24H-CAP (DILTIAZEM HCL ER BEADS) Take 1 capsule by mouth once a day CLONIDINE HCL 0.1 MG TABS (CLONIDINE HCL) Take 1 tablet by mouth two times a day  Current Allergies (reviewed today): No known allergies  Review of Systems  The patient denies anorexia, fever, and weight loss.    Vital Signs:  Patient profile:   77 year old female Menstrual status:  postmenopausal Height:      66 inches (167.64 cm) Weight:      194.19 pounds (88.27 kg) BMI:     31.46 Temp:     97.1 degrees F (36.17 degrees C) Pulse rate:   58 / minute BP sitting:   212 / 79  (left arm)  Vitals Entered By: Starleen Arms CMA (June 08, 2009 9:25 AM) CC: f/u labs Is Patient Diabetic? No Pain Assessment Patient in pain? no      Nutritional Status BMI of > 30 = obese Nutritional Status Detail nl  Does patient need assistance? Functional Status Self care Ambulation Normal   Physical Exam  General:  alert, well-developed, well-nourished, and well-hydrated.   Head:  normocephalic and atraumatic.   Mouth:  pharynx pink and moist.   Lungs:  normal breath sounds.     Impression & Recommendations:  Problem # 1:  HIV DISEASE (ICD-042) It does not appear that she is taking her Atripla despite what she says.  I will see if we can enroll her in case management services.  The difficulty is that she lives in [place].  I will order a phenotype to determine if there is some resistance not being picked up on  genotype. Orders: Est. Patient Level III (16109) T- * Misc. Laboratory test [phone])  Diagnostics Reviewed:  HIV: REACTIVE (09/12/2006)   HIV-Western blot: Positive (09/12/2006)   CD4: 240 (05/26/2009)   WBC: 5.0 (05/25/2009)   Hgb: 11.4 (05/25/2009)   HCT: 34.9 (05/25/2009)   Platelets: 367 (05/25/2009) HIV genotype: * (03/16/2009)   HIV-1 RNA: 20800 (05/25/2009)   HBSAg: NEG (09/12/2006)  Problem # 2:  HYPERTENSION, BENIGN ESSENTIAL, UNCONTROLLED (ICD-401.1) I will refill her diovan.  I explained that she needs to tak all of her BP medication. Her updated medication list for this problem includes:    Diovan Hct 320-25 Mg Tabs (Valsartan-hydrochlorothiazide) .Marland Kitchen... Take 1 tablet by mouth once a day    Diltiazem Hcl Er Beads 240 Mg Xr24h-cap (Diltiazem hcl er beads) .Marland Kitchen... Take 1 capsule by mouth once a day    Clonidine Hcl 0.1 Mg Tabs (Clonidine hcl) .Marland Kitchen... Take 1 tablet by mouth two times a day  Patient Instructions: 1)  Please schedule a follow-up appointment in 4 weeks. Prescriptions: DIOVAN HCT 320-25 MG TABS (VALSARTAN-HYDROCHLOROTHIAZIDE) Take 1 tablet by mouth once a day  #30 x 5   Entered and Authorized by:   Yisroel Ramming MD   Signed by:   Yisroel Ramming MD on 06/08/2009   Method used:   Print then Give to Patient   RxID:   [id number]

## 2010-05-30 NOTE — Progress Notes (Signed)
Summary: Cardiologist note  Phone Note Outgoing Call   Call placed by: Annice Pih Summary of Call: [facility] Cardiologist for office note from7/27/11.  Dr. Anne Fu DC pts. Diltiazem and prescribed Amlodipine 5 mg. once daily.  Called Rhonda at [facility] she will be going out to see pt. and find out if she did stop the Diltiazem and startAmlodipine. Initial call taken by: Wendall Mola CMA Duncan Dull),  December 08, 2009 8:57 AM  Follow-up for Phone Call        ok Follow-up by: Yisroel Ramming MD,  December 08, 2009 9:08 AM    New/Updated Medications: NORVASC 5 MG TABS (AMLODIPINE BESYLATE) Take 1 tablet by mouth once a day

## 2010-05-30 NOTE — Miscellaneous (Signed)
Summary: [facility] for Colpo appt. @ [facility] 09/29/2009  Clinical Lists Changes Patient missed Colpo appt. at [facility] on September 29, 2009.  Emilee Hero RN, [facility] notified by email. Jennet Maduro RN  September 30, 2009 5:04 PM

## 2010-06-01 NOTE — Miscellaneous (Signed)
Summary: HCP Continuous: Home Health Cert. & Plan Of Care  HCP Continuous: Home Health Cert. & Plan Of Care   Imported By: Florinda Marker 05/10/2010 15:55:04  _____________________________________________________________________  External Attachment:    Type:   Image     Comment:   External Document

## 2010-06-01 NOTE — Miscellaneous (Signed)
  Clinical Lists Changes  Observations: Added new observation of YEARAIDSPOS: 2008  (04/11/2010 15:05) Added new observation of HIV STATUS: CDC-defined AIDS  (04/11/2010 15:05)

## 2010-06-01 NOTE — Miscellaneous (Signed)
Summary: HCP Continuous Care: Home Health Cert  & Plan Of Care  HCP Continuous Care: Home Health Cert  & Plan Of Care   Imported By: Florinda Marker 04/20/2010 10:04:36  _____________________________________________________________________  External Attachment:    Type:   Image     Comment:   External Document

## 2010-06-07 ENCOUNTER — Encounter (INDEPENDENT_AMBULATORY_CARE_PROVIDER_SITE_OTHER): Payer: Self-pay | Admitting: *Deleted

## 2010-06-15 NOTE — Miscellaneous (Signed)
  Clinical Lists Changes 

## 2010-06-21 ENCOUNTER — Other Ambulatory Visit: Payer: Self-pay | Admitting: Adult Health

## 2010-06-21 ENCOUNTER — Encounter: Payer: Self-pay | Admitting: Adult Health

## 2010-06-21 ENCOUNTER — Other Ambulatory Visit (INDEPENDENT_AMBULATORY_CARE_PROVIDER_SITE_OTHER): Payer: Medicare Other

## 2010-06-21 ENCOUNTER — Ambulatory Visit (HOSPITAL_COMMUNITY)
Admission: RE | Admit: 2010-06-21 | Discharge: 2010-06-21 | Disposition: A | Discharge status: Home / Self Care | Payer: Medicare Other | Source: Ambulatory Visit | Attending: Family Medicine | Admitting: Family Medicine

## 2010-06-21 DIAGNOSIS — Z1231 Encounter for screening mammogram for malignant neoplasm of breast: Secondary | ICD-10-CM | POA: Insufficient documentation

## 2010-06-21 DIAGNOSIS — B20 Human immunodeficiency virus [HIV] disease: Secondary | ICD-10-CM

## 2010-06-21 LAB — CONVERTED CEMR LAB
Albumin: 4.3 g/dL (ref 3.5–5.2)
Alkaline Phosphatase: 90 units/L (ref 39–117)
BUN: 26 mg/dL — ABNORMAL HIGH (ref 6–23)
Calcium: 10 mg/dL (ref 8.4–10.5)
Creatinine, Ser: 1.18 mg/dL (ref 0.40–1.20)
Eosinophils Relative: 3 % (ref 0–5)
Glucose, Bld: 104 mg/dL — ABNORMAL HIGH (ref 70–99)
HCT: 35.5 % — ABNORMAL LOW (ref 36.0–46.0)
Hemoglobin: 11.8 g/dL — ABNORMAL LOW (ref 12.0–15.0)
Lymphocytes Relative: 33 % (ref 12–46)
MCHC: 33.2 g/dL (ref 30.0–36.0)
Monocytes Absolute: 0.5 10*3/uL (ref 0.1–1.0)
Monocytes Relative: 8 % (ref 3–12)
Neutro Abs: 3.4 10*3/uL (ref 1.7–7.7)
Potassium: 4.3 meq/L (ref 3.5–5.3)
RBC: 3.56 M/uL — ABNORMAL LOW (ref 3.87–5.11)

## 2010-06-22 LAB — T-HELPER CELL (CD4) - (RCID CLINIC ONLY): CD4 T Cell Abs: 320 uL — ABNORMAL LOW (ref 400–2700)

## 2010-06-27 ENCOUNTER — Encounter: Payer: Self-pay | Admitting: Adult Health

## 2010-06-27 ENCOUNTER — Encounter (INDEPENDENT_AMBULATORY_CARE_PROVIDER_SITE_OTHER): Payer: Self-pay | Admitting: *Deleted

## 2010-07-05 ENCOUNTER — Encounter: Payer: Self-pay | Admitting: Adult Health

## 2010-07-05 ENCOUNTER — Ambulatory Visit: Payer: Medicare Other | Admitting: Adult Health

## 2010-07-06 ENCOUNTER — Encounter: Payer: Self-pay | Admitting: Adult Health

## 2010-07-06 NOTE — Assessment & Plan Note (Signed)
Summary: F/U/VS   CC:  follow-up visit fo labs.  History of Present Illness: Presents for 3 month f/u after labs obtained.  Essentially feeling well with no major complaints voiced.  Adherent with meds with good tolerance.   Current Allergies (reviewed today): No known allergies  Review of Systems  The patient denies anorexia, fever, weight loss, weight gain, vision loss, decreased hearing, hoarseness, chest pain, syncope, dyspnea on exertion, peripheral edema, prolonged cough, headaches, hemoptysis, abdominal pain, melena, hematochezia, severe indigestion/heartburn, hematuria, incontinence, genital sores, muscle weakness, suspicious skin lesions, transient blindness, difficulty walking, depression, unusual weight change, abnormal bleeding, enlarged lymph nodes, angioedema, and breast masses.    Vital Signs:  Patient profile:   77 year old female Menstrual status:  postmenopausal Height:      66 inches Weight:      186.50 pounds BMI:     30.21 Temp:     97.8 degrees F Pulse rate:   56 / minute BP sitting:   164 / 78  (left arm)  Vitals Entered By: Alesia Morin CMA (May 24, 2010 10:18 AM) CC: follow-up visit fo labs Is Patient Diabetic? No Pain Assessment Patient in pain? no      Nutritional Status BMI of > 30 = obese Nutritional Status Detail  appetite "good"  Have you ever been in a relationship where you felt threatened, hurt or afraid?No   Does patient need assistance? Functional Status Self care Ambulation Normal Comments no missed doses of meds   Physical Exam  General:  alert, well-developed, well-nourished, and well-hydrated.   Head:  normocephalic and atraumatic.   Eyes:  vision grossly intact, pupils equal, pupils round, and pupils reactive to light.   Ears:  R ear normal and L ear normal.   Mouth:  pharynx pink and moist.   Neck:  supple, full ROM, and no masses.   Lungs:  normal breath sounds.  normal respiratory effort.   Heart:  regular  rhythm and bradycardia.   Abdomen:  soft, non-tender, normal bowel sounds, no hepatomegaly, and no splenomegaly.   Msk:  No deformity or scoliosis noted of thoracic or lumbar spine.   Pulses:  R and L carotid,radial,femoral,dorsalis pedis and posterior tibial pulses are full and equal bilaterally Extremities:  No clubbing, cyanosis, edema, or deformity noted with normal full range of motion of all joints.   Neurologic:  alert & oriented X3, cranial nerves II-XII intact, strength normal in all extremities, and gait normal.   Skin:  Intact without suspicious lesions or rashes Psych:  Cognition and judgment appear intact. Alert and cooperative with normal attention span and concentration. No apparent delusions, illusions, hallucinations   Impression & Recommendations:  Problem # 1:  HIV DISEASE (ICD-042)  CD4 310 @ 14% which is stable from assessment in 12/2009.  HIV RNA stable at <20 copies/ml via PCR.  Current regimen effective but may need to change dosing frequency of Truvada due to decreasing GFR.  Orders: Est. Patient Level IV (99214)Future Orders: T-CBC w/Diff ([id number]) ... 06/21/2010 T-CD4SP ([facility]) (CD4SP) ... 06/21/2010 T-Comprehensive Metabolic Panel [id number]) ... 06/21/2010 T-HIV Viral Load [id number]) ... 06/21/2010  Problem # 2:  RENAL INSUFFICIENCY (ICD-588.9)  Recent labs show serum creatitine at 1.41, BUN at 29, serum albumin at 4.4.  Calculated GFR by C-G: 46.2 mL/min, and by MDRD: 45.8 mL/min.  Will decrease Truvada to 1 tablet by mouth q Mon-Wed-Fri for now, have her return for repeat labs in 4 weeks and f/u eval in 6 weeks.  Orders: Est. Patient Level IV (16109)  Problem # 3:  HYPERTENSION, BENIGN ESSENTIAL, UNCONTROLLED (ICD-401.1)  Repeat BP was 148/78 (L).  Will continue present management for now and monitor. Her updated medication list for this problem includes:    Norvasc 5 Mg Tabs (Amlodipine besylate) .Marland Kitchen... Take 1 tablet by mouth once a day     Diovan Hct 320-25 Mg Tabs (Valsartan-hydrochlorothiazide) .Marland Kitchen... Take 1 tablet by mouth once a day    Clonidine Hcl 0.2 Mg Tabs (Clonidine hcl) .Marland Kitchen... Take 1 tablet by mouth two times a day  Orders: Est. Patient Level IV (60454)  Medications Added to Medication List This Visit: 1)  Truvada 200-300 Mg Tabs (Emtricitabine-tenofovir) .... Take 1 tablet by mouth every monday, wednesday, & friday  Other Orders: Hepatitis B Vaccine >52yrs (09811) Admin 1st Vaccine (91478)  Patient Instructions: 1)  Take TRUVADA one tablet by mouth every MONDAY, WEDNESDAY, & FRIDAY only. 2)  Please return for repeat labs in 4 weeks with a follow-up scheduled in 6 weeks. 3)  Cointinue taking blood pressure medicine as prescribed. Prescriptions: TRUVADA 200-300 MG TABS (EMTRICITABINE-TENOFOVIR) Take 1 tablet by mouth every Monday, Wednesday, & Friday  #12 x 2   Entered and Authorized by:   Talmadge Chad NP   Signed by:   Talmadge Chad NP on 05/24/2010   Method used:   Faxed to ...       [facility] (mail-order)       [address] [place], [place]  [address]       Ph: [phone]       Fax: [phone]   RxID:   [id number]     Immunizations Administered:  Hepatitis B Vaccine # 2:    Vaccine Type: HepB Adult    Site: left deltoid    Mfr: Merck    Dose: 1.0 ml    Route: IM    Given by: Alesia Morin CMA    Exp. Date: 02/01/2012    Lot #: [id number]

## 2010-07-06 NOTE — Miscellaneous (Signed)
Summary: Release of Information  Clinical Lists Changes Recieved a medical information request from [facility] asking for records. The records requested were faxed today and the fax was [email or web address]. Alesia Morin CMA  June 27, 2010 2:13 PM

## 2010-07-11 ENCOUNTER — Encounter: Payer: Self-pay | Admitting: Adult Health

## 2010-07-11 NOTE — Miscellaneous (Signed)
Summary: [facility]  [facility]   Imported By: Florinda Marker 07/06/2010 09:49:20  _____________________________________________________________________  External Attachment:    Type:   Image     Comment:   External Document

## 2010-07-18 LAB — T-HELPER CELL (CD4) - (RCID CLINIC ONLY): CD4 % Helper T Cell: 12 % — ABNORMAL LOW (ref 33–55)

## 2010-07-19 ENCOUNTER — Ambulatory Visit: Payer: Medicare Other | Admitting: *Deleted

## 2010-07-19 VITALS — BP 157/77 | HR 50 | Temp 98.0°F | Ht 66.0 in | Wt 184.0 lb

## 2010-07-19 DIAGNOSIS — I10 Essential (primary) hypertension: Secondary | ICD-10-CM

## 2010-07-27 NOTE — Assessment & Plan Note (Signed)
Summary: 6wk f/u   Vital Signs:  Patient profile:   77 year old female Menstrual status:  postmenopausal Height:      66 inches Weight:      182.4 pounds BMI:     29.55 Temp:     97.6 degrees F Pulse rate:   59 / minute BP sitting:   195 / 70  (right arm)  Vitals Entered By: Alesia Morin CMA (July 05, 2010 2:30 PM) CC: follow-up visit for labs Is Patient Diabetic? No Pain Assessment Patient in pain? no      Nutritional Status BMI of 25 - 29 = overweight Nutritional Status Detail appetite "good"  Have you ever been in a relationship where you felt threatened, hurt or afraid?No   Does patient need assistance? Functional Status Self care Ambulation Normal Comments no missed doses of meds   CC:  follow-up visit for labs.  Preventive Screening-Counseling & Management  Alcohol-Tobacco     Alcohol drinks/day: 0     Smoking Status: quit     Packs/Day: 00     Year Quit: 1985     Pack years: 1 ppd     Passive Smoke Exposure: no  Caffeine-Diet-Exercise     Caffeine use/day: coffee one per day     Does Patient Exercise: yes     Type of exercise: walking     Times/week: 5  Hep-HIV-STD-Contraception     HIV Risk: no  Safety-Violence-Falls     Seat Belt Use: yes      Sexual History:  no.        Drug Use:  no.        Blood Transfusions:  no.        Travel History:  no.    Comments: pt declined condoms  Allergies (verified): No Known Drug Allergies  Social History: Sexual History:  no Blood Transfusions:  no Travel History:  no

## 2010-08-01 ENCOUNTER — Other Ambulatory Visit (INDEPENDENT_AMBULATORY_CARE_PROVIDER_SITE_OTHER): Payer: Medicare Other | Admitting: *Deleted

## 2010-08-01 DIAGNOSIS — Z21 Asymptomatic human immunodeficiency virus [HIV] infection status: Secondary | ICD-10-CM

## 2010-08-01 DIAGNOSIS — B20 Human immunodeficiency virus [HIV] disease: Secondary | ICD-10-CM

## 2010-08-01 MED ORDER — EMTRICITABINE-TENOFOVIR DF 200-300 MG PO TABS: 1.0000 | ORAL_TABLET | ORAL | Status: DC

## 2010-08-01 MED ORDER — RALTEGRAVIR POTASSIUM 400 MG PO TABS: 400.0000 mg | ORAL_TABLET | Freq: Two times a day (BID) | ORAL | Status: DC

## 2010-08-02 LAB — T-HELPER CELL (CD4) - (RCID CLINIC ONLY)
CD4 % Helper T Cell: 17 % — ABNORMAL LOW (ref 33–55)
CD4 T Cell Abs: 260 uL — ABNORMAL LOW (ref 400–2700)

## 2010-08-05 LAB — T-HELPER CELL (CD4) - (RCID CLINIC ONLY)
CD4 % Helper T Cell: 15 % — ABNORMAL LOW (ref 33–55)
CD4 T Cell Abs: 330 uL — ABNORMAL LOW (ref 400–2700)

## 2010-08-06 LAB — POCT I-STAT, CHEM 8
Calcium, Ion: 1.24 mmol/L (ref 1.12–1.32)
Hemoglobin: 11.9 g/dL — ABNORMAL LOW (ref 12.0–15.0)
Sodium: 143 mEq/L (ref 135–145)
TCO2: 25 mmol/L (ref 0–100)

## 2010-09-12 NOTE — Group Therapy Note (Signed)
NAME:  Karen Luna, Karen Luna NO.:  [id number]   MEDICAL RECORD NO.:  [id number]          PATIENT TYPE:  WOC   LOCATION:  [facility]                   FACILITY:  [facility]   PHYSICIAN:  Scheryl Darter, MD       DATE OF BIRTH:  [birth year]   DATE OF SERVICE:  05/27/2008                                  CLINIC NOTE   CHIEF COMPLAINT:  Abnormal Pap test, history of vaginal cuff low-grade  SIL.   The patient is a 77 year old white female who is HIV positive and had a  Pap, which showed low-grade squamous intraepithelial lesion, and biopsy  revealed VAIN-1 koilocytic atypia.  Biopsy was done on October 16, 2007 by  Dr. Marice Potter.  The patient has hypertension and recently was seen at the  [facility] by Dr. Philipp Deputy.  She states her blood  pressure medicine was changed, but she cannot change dates of medicine.  No complaints of any vaginal discharge, headaches, visual changes, or  shortness of breath.   PHYSICAL EXAMINATION:  The patient is in no acute distress.  External  genitalia appeared normal with signs of mild atrophy.  Vagina was  atrophic with slight yellowish discharge, no odor.  Vaginal cuff was  well supported and there were no lesions.  Pap smear was obtained.  Bimanual was deferred.  Blood pressure today was 208-232/92-107.   IMPRESSION:  1. History of vaginal intraepithelial neoplasm-1.  2. History of positive human immunodeficiency virus.  3. Hypertension, well controlled.   PLAN:  The patient will return for repeat Pap in 6 months.  I recommend  that she be seen by her family practitioner today to address the blood  pressure.      Scheryl Darter, MD     JA/MEDQ  D:  05/27/2008  T:  05/28/2008  Job:  [id number]

## 2010-09-12 NOTE — Group Therapy Note (Signed)
NAME:  Karen Luna, Karen Luna NO.:  [id number]   MEDICAL RECORD NO.:  [id number]          PATIENT TYPE:  WOC   LOCATION:  [facility]                   [facility]ACILITY:  [facility]   PHYSICIAN:  Ginger Carne, MD DATE OF BIRTH:  [birth year]   DATE OF SERVICE:  10/29/2007                                  CLINIC NOTE   The patient returns today for results following biopsy of the vaginal  cuff preceded by low grade squamous intraepithelial lesion on August 13, 2007.  HPV positive and patient is HIV positive.  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  It is unclear  why the report returned a cervical biopsy.  I spoke to Dr. Marice Potter today,  who performed said procedure, and she recalls the patient had a vaginal  cuff biopsy.  The patient has had a previous hysterectomy.  At this  point, the patient was advised to return in six months for a followup  Pap smear and that this would be performed on two occasions 6 months  apart, and if negative, return to yearly testing.  The patient is  satisfied with this and will return in January of 2010.           ______________________________  Ginger Carne, MD     SHB/MEDQ  D:  10/29/2007  T:  10/29/2007  Job:  [id number]

## 2010-09-12 NOTE — Group Therapy Note (Signed)
NAME:  Karen Luna, Karen Luna NO.:  [id number]   MEDICAL RECORD NO.:  [id number]          PATIENT TYPE:  WOC   [facility]ATION[facility]                   [facility]ACILITY:  [facility]   PHYSICIAN:  Elsie Lincoln, MD      DATE OF BIRTH:  1931/08/21   DATE OF SERVICE:  11/05/2008                                  CLINIC NOTE   The patient is a 77 year old female who presents for repeat Pap smear.  The patient had a hysterectomy in the 1990s for cancer in [place].  The  patient states she does not know what kind of cancer but it is cancer.  It was done in [place] at [facility] but that hospital  since closed, I did verify this using the world wide web, so I do not  know what type of cancer [name] had.  The patient had a Pap smear  that was abnormal and then a colposcopy done which interestingly showed  CIN 1 with a lack of cervix.  We did repeat a Pap smear 6 months later  which showed ASCUS with HPV positive and now she presents for her second  Pap smear after colpo.  I believe this will be negative.  Also of note,  the patient is HIV positive.   More importantly today, her blood pressure is 230/110 on manual check.  The patient states that her primary care doctor took her off all of her  medications which I find hard to believe.  I do find her history hard to  follow and she seems a little confused.  So, immediately after the Pap  smear is done I am sending her to the [facility] for some IV labetalol and then  transfer to [facility] or [facility] for evaluation of her severe  hypertension.  On physical exam, the patient has an atrophic vagina with  no presence of a cervix, and a Pap smear was taken.   ASSESSMENT AND PLAN:  A 77 year old female with history of a female  cancer, unknown origin.  1. Pap smear today for history of abnormal Pap smear.  See above      narrative.  2. Maternity Admissions Unit for hypertension.  3. Depending on Pap smear result, we will dictate  further evaluation.           ______________________________  Elsie Lincoln, MD     KL/MEDQ  D:  11/05/2008  T:  11/06/2008  Job:  [id number]

## 2010-10-04 ENCOUNTER — Other Ambulatory Visit: Payer: Medicare Other

## 2010-10-05 ENCOUNTER — Other Ambulatory Visit: Payer: Self-pay | Admitting: Infectious Diseases

## 2010-10-05 ENCOUNTER — Other Ambulatory Visit: Payer: Self-pay | Admitting: Adult Health

## 2010-10-05 ENCOUNTER — Other Ambulatory Visit: Payer: Self-pay

## 2010-10-05 ENCOUNTER — Other Ambulatory Visit: Payer: Medicare Other

## 2010-10-05 DIAGNOSIS — Z113 Encounter for screening for infections with a predominantly sexual mode of transmission: Secondary | ICD-10-CM

## 2010-10-05 DIAGNOSIS — B20 Human immunodeficiency virus [HIV] disease: Secondary | ICD-10-CM

## 2010-10-05 DIAGNOSIS — Z79899 Other long term (current) drug therapy: Secondary | ICD-10-CM

## 2010-10-06 LAB — CBC WITH DIFFERENTIAL/PLATELET
Basophils Absolute: 0 10*3/uL (ref 0.0–0.1)
Basophils Relative: 0 % (ref 0–1)
Eosinophils Absolute: 0.1 10*3/uL (ref 0.0–0.7)
Eosinophils Relative: 2 % (ref 0–5)
HCT: 36.8 % (ref 36.0–46.0)
Hemoglobin: 11.9 g/dL — ABNORMAL LOW (ref 12.0–15.0)
MCH: 31.8 pg (ref 26.0–34.0)
MCHC: 32.3 g/dL (ref 30.0–36.0)
MCV: 98.4 fL (ref 78.0–100.0)
Monocytes Absolute: 0.6 10*3/uL (ref 0.1–1.0)
Monocytes Relative: 8 % (ref 3–12)
Neutro Abs: 3.7 10*3/uL (ref 1.7–7.7)
RDW: 13.7 % (ref 11.5–15.5)

## 2010-10-06 LAB — URINALYSIS, MICROSCOPIC ONLY
Casts: NONE SEEN
Crystals: NONE SEEN

## 2010-10-06 LAB — LIPID PANEL
LDL Cholesterol: 132 mg/dL — ABNORMAL HIGH (ref 0–99)
Triglycerides: 156 mg/dL — ABNORMAL HIGH (ref <150)
VLDL: 31 mg/dL (ref 0–40)

## 2010-10-06 LAB — COMPLETE METABOLIC PANEL WITH GFR
ALT: 14 U/L (ref 0–35)
Albumin: 4.4 g/dL (ref 3.5–5.2)
Alkaline Phosphatase: 95 U/L (ref 39–117)
GFR, Est Non African American: 42 mL/min — ABNORMAL LOW (ref >60)
Glucose, Bld: 108 mg/dL — ABNORMAL HIGH (ref 70–99)
Potassium: 4.6 mEq/L (ref 3.5–5.3)
Sodium: 140 mEq/L (ref 135–145)
Total Bilirubin: 0.3 mg/dL (ref 0.3–1.2)
Total Protein: 8.1 g/dL (ref 6.0–8.3)

## 2010-10-06 LAB — T-HELPER CELL (CD4) - (RCID CLINIC ONLY): CD4 % Helper T Cell: 13 % — ABNORMAL LOW (ref 33–55)

## 2010-10-06 LAB — URINALYSIS, ROUTINE W REFLEX MICROSCOPIC
Bilirubin Urine: NEGATIVE
Ketones, ur: NEGATIVE mg/dL
Specific Gravity, Urine: 1.024 (ref 1.005–1.030)
pH: 5 (ref 5.0–8.0)

## 2010-10-19 ENCOUNTER — Encounter: Payer: Self-pay | Admitting: Adult Health

## 2010-10-19 ENCOUNTER — Ambulatory Visit (INDEPENDENT_AMBULATORY_CARE_PROVIDER_SITE_OTHER): Payer: Medicare Other | Admitting: Adult Health

## 2010-10-19 VITALS — BP 150/77 | HR 53 | Temp 97.7°F | Ht 66.0 in | Wt 185.0 lb

## 2010-10-19 DIAGNOSIS — N390 Urinary tract infection, site not specified: Secondary | ICD-10-CM

## 2010-10-19 DIAGNOSIS — Z79899 Other long term (current) drug therapy: Secondary | ICD-10-CM

## 2010-10-19 DIAGNOSIS — I10 Essential (primary) hypertension: Secondary | ICD-10-CM

## 2010-10-19 DIAGNOSIS — B20 Human immunodeficiency virus [HIV] disease: Secondary | ICD-10-CM

## 2010-10-19 LAB — URINALYSIS, ROUTINE W REFLEX MICROSCOPIC
Ketones, ur: NEGATIVE mg/dL
Nitrite: NEGATIVE
Specific Gravity, Urine: 1.016 (ref 1.005–1.030)
Urobilinogen, UA: 0.2 mg/dL (ref 0.0–1.0)
pH: 5 (ref 5.0–8.0)

## 2010-10-19 MED ORDER — AMLODIPINE BESYLATE 10 MG PO TABS: 10.0000 mg | ORAL_TABLET | Freq: Every day | ORAL | Status: DC

## 2010-10-19 NOTE — Progress Notes (Signed)
  Subjective:    Patient ID: Karen Luna, female    DOB: 1932-03-28, 77 y.o.   MRN: [id number]  HPI Presents to clinic for scheduled followup. States is feeling well with no physical complaints. Remains adherent to her antiretrovirals with good tolerance and no complications.   Review of Systems  Constitutional: Negative.   HENT: Negative.   Eyes: Negative.   Respiratory: Negative.   Cardiovascular: Negative.   Gastrointestinal: Negative.   Genitourinary: Negative.   Musculoskeletal: Negative.   Skin: Negative.   Neurological: Negative.   Hematological: Negative.   Psychiatric/Behavioral: Negative.        Objective:   Physical Exam  Constitutional: She is oriented to person, place, and time. She appears well-developed and well-nourished. No distress.  HENT:  Head: Normocephalic and atraumatic.  Right Ear: External ear normal.  Left Ear: External ear normal.  Nose: Nose normal.  Mouth/Throat: Oropharynx is clear and moist.  Eyes: Conjunctivae and EOM are normal. Pupils are equal, round, and reactive to light.  Neck: Normal range of motion. Neck supple.  Cardiovascular: Normal rate, regular rhythm, normal heart sounds and intact distal pulses.   Pulmonary/Chest: Effort normal and breath sounds normal.  Abdominal: Soft. Bowel sounds are normal.  Musculoskeletal: Normal range of motion.  Neurological: She is alert and oriented to person, place, and time. No cranial nerve deficit. She exhibits normal muscle tone. Coordination normal.  Skin: Skin is warm and dry.  Psychiatric: She has a normal mood and affect. Her behavior is normal. Judgment and thought content normal.          Assessment & Plan:   1. HIV. Labs obtained 10/05/2010 show a CD4 count of 340 at 13% with a viral load of less than 20 copies/mL. Remains clinically stable on current regimen. Recommend continuing present management, repeating labs in 10 weeks, followup in 3 months.  2. Hypertension. Blood  pressure is more markedly better however, still have not received complete control. Therefore, we will increase her amlodipine from 5 mg, to 10 mg by mouth daily, and asked that she return to clinic for a nurse visit in one week to recheck. Her blood pressure.  3. Abnormal Urinalysis. While she does not complaining of any symptoms. Her urinalysis that was obtained on June 7, showed significant elevation in leukocytes, and white cells seen on smear. No culture was obtained at that time. We will repeat a urinalysis today along with a culture and will pend results to consider antibiotic therapy. This may be nothing more than a colonizing, infection, but there is no data that indicates this as previously occurred.  She verbally acknowledged all information that was provided to her and agreed with plan of care.

## 2010-10-20 LAB — URINALYSIS, MICROSCOPIC ONLY
Casts: NONE SEEN
Crystals: NONE SEEN

## 2010-10-21 LAB — CULTURE, URINE COMPREHENSIVE
Colony Count: NO GROWTH
Organism ID, Bacteria: NO GROWTH

## 2010-10-27 ENCOUNTER — Ambulatory Visit: Payer: Medicare Other | Admitting: Adult Health

## 2010-10-27 ENCOUNTER — Ambulatory Visit: Payer: Medicare Other

## 2010-10-27 VITALS — BP 125/75

## 2010-10-27 DIAGNOSIS — I10 Essential (primary) hypertension: Secondary | ICD-10-CM

## 2011-01-12 ENCOUNTER — Other Ambulatory Visit (INDEPENDENT_AMBULATORY_CARE_PROVIDER_SITE_OTHER): Payer: Medicare Other

## 2011-01-12 DIAGNOSIS — B20 Human immunodeficiency virus [HIV] disease: Secondary | ICD-10-CM

## 2011-01-12 DIAGNOSIS — Z113 Encounter for screening for infections with a predominantly sexual mode of transmission: Secondary | ICD-10-CM

## 2011-01-12 DIAGNOSIS — Z79899 Other long term (current) drug therapy: Secondary | ICD-10-CM

## 2011-01-12 DIAGNOSIS — Z23 Encounter for immunization: Secondary | ICD-10-CM

## 2011-01-12 LAB — CBC WITH DIFFERENTIAL/PLATELET
Eosinophils Absolute: 0.2 10*3/uL (ref 0.0–0.7)
Hemoglobin: 11.5 g/dL — ABNORMAL LOW (ref 12.0–15.0)
Lymphocytes Relative: 37 % (ref 12–46)
Lymphs Abs: 2.6 10*3/uL (ref 0.7–4.0)
MCH: 32.5 pg (ref 26.0–34.0)
Monocytes Relative: 9 % (ref 3–12)
Neutro Abs: 3.5 10*3/uL (ref 1.7–7.7)
Neutrophils Relative %: 51 % (ref 43–77)
Platelets: 448 10*3/uL — ABNORMAL HIGH (ref 150–400)
RBC: 3.54 MIL/uL — ABNORMAL LOW (ref 3.87–5.11)
WBC: 6.9 10*3/uL (ref 4.0–10.5)

## 2011-01-12 LAB — COMPLETE METABOLIC PANEL WITH GFR
ALT: 15 U/L (ref 0–35)
BUN: 23 mg/dL (ref 6–23)
CO2: 23 mEq/L (ref 19–32)
Calcium: 10.4 mg/dL (ref 8.4–10.5)
Chloride: 105 mEq/L (ref 96–112)
Creat: 1.38 mg/dL — ABNORMAL HIGH (ref 0.50–1.10)
GFR, Est African American: 45 mL/min — ABNORMAL LOW (ref >60)
GFR, Est Non African American: 37 mL/min — ABNORMAL LOW (ref >60)
Glucose, Bld: 103 mg/dL — ABNORMAL HIGH (ref 70–99)
Total Bilirubin: 0.3 mg/dL (ref 0.3–1.2)

## 2011-01-12 LAB — T-HELPER CELL (CD4) - (RCID CLINIC ONLY): CD4 T Cell Abs: 390 uL — ABNORMAL LOW (ref 400–2700)

## 2011-01-12 LAB — URINALYSIS, ROUTINE W REFLEX MICROSCOPIC
Nitrite: NEGATIVE
Protein, ur: NEGATIVE mg/dL

## 2011-01-12 LAB — RPR

## 2011-01-12 LAB — LIPID PANEL
HDL: 42 mg/dL (ref >39)
LDL Cholesterol: 135 mg/dL — ABNORMAL HIGH (ref 0–99)
Triglycerides: 175 mg/dL — ABNORMAL HIGH (ref <150)

## 2011-01-12 LAB — URINALYSIS, MICROSCOPIC ONLY

## 2011-01-13 LAB — GC/CHLAMYDIA PROBE AMP, URINE: GC Probe Amp, Urine: NEGATIVE

## 2011-01-15 LAB — HIV-1 RNA QUANT-NO REFLEX-BLD
HIV 1 RNA Quant: <20 copies/mL (ref <20)
HIV-1 RNA Quant, Log: <1.3 {Log} (ref <1.30)

## 2011-01-22 LAB — T-HELPER CELL (CD4) - (RCID CLINIC ONLY)
CD4 % Helper T Cell: 11 — ABNORMAL LOW
CD4 T Cell Abs: 170 — ABNORMAL LOW

## 2011-01-26 LAB — T-HELPER CELL (CD4) - (RCID CLINIC ONLY): CD4 T Cell Abs: 200 — ABNORMAL LOW

## 2011-01-29 LAB — T-HELPER CELL (CD4) - (RCID CLINIC ONLY): CD4 % Helper T Cell: 14 — ABNORMAL LOW

## 2011-02-01 ENCOUNTER — Ambulatory Visit (INDEPENDENT_AMBULATORY_CARE_PROVIDER_SITE_OTHER): Payer: Medicare Other | Admitting: Internal Medicine

## 2011-02-01 ENCOUNTER — Encounter: Payer: Self-pay | Admitting: Internal Medicine

## 2011-02-01 VITALS — BP 167/92 | HR 62 | Temp 97.8°F | Ht 66.0 in | Wt 184.0 lb

## 2011-02-01 DIAGNOSIS — B20 Human immunodeficiency virus [HIV] disease: Secondary | ICD-10-CM

## 2011-02-01 DIAGNOSIS — Z23 Encounter for immunization: Secondary | ICD-10-CM

## 2011-02-01 NOTE — Assessment & Plan Note (Signed)
She continues to do well on her current regimen with an undetectable viral load. She has no particular complaints and her creatinine is stable. She should continue with her 3 times a week Truvada and daily twice a day Isentress. She is up-to-date with her immunizations today having received her third hepatitis B. She did get her flu shot. I did discuss effects of medications that include her kidney and heart and good healthy habits. She will return in followup in 4 months.

## 2011-02-01 NOTE — Patient Instructions (Signed)
Continue with your current medications   

## 2011-02-01 NOTE — Progress Notes (Signed)
  Subjective:    Patient ID: Karen Luna, female    DOB: 06-13-1931, 77 y.o.   MRN: [id number]  HPI she returns in followup today for Z042. She has no particular complaints and continues to have excellent adherence with her medications. She is on Isentress and 3 times a week Truvada and has excellent tolerance of the medication. She does have mildly elevated creatinine and her creatinine clearance is under 50. It is mildly increased compared to previous but not significantly. She reports no weight loss, no diarrhea no rashes, or other complaints. She is aware of her every other day therapy and does take the Isentress twice a day.    Review of Systems  Constitutional: Negative for fever, activity change, fatigue and unexpected weight change.  HENT: Negative for congestion.   Respiratory: Negative for cough, shortness of breath and wheezing.   Cardiovascular: Negative for chest pain and leg swelling.  Gastrointestinal: Negative for nausea, diarrhea and constipation.  Genitourinary: Negative for difficulty urinating.  Musculoskeletal: Negative for myalgias and arthralgias.  Skin: Negative for rash.       Objective:   Physical Exam  Constitutional: She appears well-developed and well-nourished.  HENT:  Mouth/Throat: No oropharyngeal exudate.  Cardiovascular: Normal rate, regular rhythm and normal heart sounds.   No murmur heard. Pulmonary/Chest: Effort normal and breath sounds normal. No respiratory distress.  Abdominal: Soft. Bowel sounds are normal. She exhibits no distension.  Lymphadenopathy:    She has no cervical adenopathy.  Neurological: She is alert.  Skin: Skin is warm and dry. No rash noted.  Psychiatric: She has a normal mood and affect. Her behavior is normal.          Assessment & Plan:

## 2011-02-07 ENCOUNTER — Other Ambulatory Visit: Payer: Self-pay | Admitting: *Deleted

## 2011-02-07 DIAGNOSIS — I10 Essential (primary) hypertension: Secondary | ICD-10-CM

## 2011-02-07 DIAGNOSIS — B20 Human immunodeficiency virus [HIV] disease: Secondary | ICD-10-CM

## 2011-02-07 MED ORDER — EMTRICITABINE-TENOFOVIR DF 200-300 MG PO TABS: 1.0000 | ORAL_TABLET | ORAL | Status: DC

## 2011-02-07 MED ORDER — AMLODIPINE BESYLATE 10 MG PO TABS: 10.0000 mg | ORAL_TABLET | Freq: Every day | ORAL | Status: DC

## 2011-02-13 LAB — T-HELPER CELL (CD4) - (RCID CLINIC ONLY): CD4 % Helper T Cell: 9 — ABNORMAL LOW

## 2011-03-07 ENCOUNTER — Other Ambulatory Visit: Payer: Self-pay | Admitting: *Deleted

## 2011-03-07 DIAGNOSIS — B20 Human immunodeficiency virus [HIV] disease: Secondary | ICD-10-CM

## 2011-03-07 MED ORDER — RALTEGRAVIR POTASSIUM 400 MG PO TABS: 400.0000 mg | ORAL_TABLET | Freq: Two times a day (BID) | ORAL | Status: DC

## 2011-05-11 ENCOUNTER — Encounter: Payer: Self-pay | Admitting: *Deleted

## 2011-06-08 ENCOUNTER — Other Ambulatory Visit: Payer: Medicare Other

## 2011-06-08 DIAGNOSIS — B20 Human immunodeficiency virus [HIV] disease: Secondary | ICD-10-CM

## 2011-06-08 LAB — COMPLETE METABOLIC PANEL WITH GFR
ALT: 17 U/L (ref 0–35)
AST: 20 U/L (ref 0–37)
Alkaline Phosphatase: 97 U/L (ref 39–117)
Calcium: 9.9 mg/dL (ref 8.4–10.5)
GFR, Est African American: 52 mL/min — ABNORMAL LOW
Glucose, Bld: 90 mg/dL (ref 70–99)
Potassium: 4.7 mEq/L (ref 3.5–5.3)
Sodium: 137 mEq/L (ref 135–145)
Total Bilirubin: 0.3 mg/dL (ref 0.3–1.2)
Total Protein: 7.9 g/dL (ref 6.0–8.3)

## 2011-06-08 LAB — CBC WITH DIFFERENTIAL/PLATELET
Eosinophils Absolute: 0.1 10*3/uL (ref 0.0–0.7)
Lymphocytes Relative: 42 % (ref 12–46)
Lymphs Abs: 2.7 10*3/uL (ref 0.7–4.0)
Neutro Abs: 3 10*3/uL (ref 1.7–7.7)
Neutrophils Relative %: 47 % (ref 43–77)
Platelets: 362 10*3/uL (ref 150–400)
RBC: 3.6 MIL/uL — ABNORMAL LOW (ref 3.87–5.11)
WBC: 6.4 10*3/uL (ref 4.0–10.5)

## 2011-06-08 LAB — T-HELPER CELL (CD4) - (RCID CLINIC ONLY): CD4 T Cell Abs: 460 uL (ref 400–2700)

## 2011-06-12 LAB — HIV-1 RNA QUANT-NO REFLEX-BLD: HIV-1 RNA Quant, Log: <1.3 {Log} (ref <1.30)

## 2011-06-21 ENCOUNTER — Other Ambulatory Visit: Payer: Self-pay | Admitting: *Deleted

## 2011-06-21 DIAGNOSIS — I10 Essential (primary) hypertension: Secondary | ICD-10-CM

## 2011-06-21 MED ORDER — VALSARTAN-HYDROCHLOROTHIAZIDE 320-25 MG PO TABS: 1.0000 | ORAL_TABLET | Freq: Every day | ORAL | Status: DC

## 2011-06-22 ENCOUNTER — Ambulatory Visit: Payer: Medicare Other | Admitting: Adult Health

## 2011-06-25 ENCOUNTER — Ambulatory Visit (INDEPENDENT_AMBULATORY_CARE_PROVIDER_SITE_OTHER): Payer: Medicare Other | Admitting: Internal Medicine

## 2011-06-25 ENCOUNTER — Telehealth: Payer: Self-pay | Admitting: *Deleted

## 2011-06-25 ENCOUNTER — Encounter: Payer: Self-pay | Admitting: Internal Medicine

## 2011-06-25 VITALS — BP 175/76 | HR 67 | Temp 97.9°F | Ht 67.0 in | Wt 185.0 lb

## 2011-06-25 DIAGNOSIS — Z23 Encounter for immunization: Secondary | ICD-10-CM

## 2011-06-25 DIAGNOSIS — I10 Essential (primary) hypertension: Secondary | ICD-10-CM

## 2011-06-25 DIAGNOSIS — B20 Human immunodeficiency virus [HIV] disease: Secondary | ICD-10-CM

## 2011-06-25 NOTE — Progress Notes (Signed)
  Subjective:    Patient ID: Karen Luna, female    DOB: 05/10/1931, 78 y.o.   MRN: [id number]  HPI she is here for follow up of her 042.  She continues to take 3 times per week Truvada with daily Isentress and continues to be undetectable with a good CD4 cell count.  She also continues to have problems with elevated BP despite taking all of her medicine daily.  She denies any headaches, chest pain, sob or leg edema.  She denies any missed doses of her ARVs and has no complaints.      Review of Systems  Constitutional: Negative for fever, chills, fatigue and unexpected weight change.  HENT: Negative for sore throat and trouble swallowing.   Respiratory: Negative for cough, chest tightness, shortness of breath and wheezing.   Cardiovascular: Negative for chest pain, palpitations and leg swelling.  Gastrointestinal: Negative for nausea, abdominal pain and diarrhea.  Musculoskeletal: Negative for myalgias, joint swelling and arthralgias.  Skin: Negative for rash.  Neurological: Negative for syncope, weakness and headaches.  Hematological: Negative for adenopathy.  Psychiatric/Behavioral: Negative for sleep disturbance and dysphoric mood. The patient is not nervous/anxious.        Objective:   Physical Exam  Constitutional: She appears well-developed and well-nourished. No distress.  HENT:  Mouth/Throat: Oropharynx is clear and moist. No oropharyngeal exudate.  Cardiovascular: Normal rate, regular rhythm and normal heart sounds.  Exam reveals no gallop and no friction rub.   No murmur heard. Pulmonary/Chest: Effort normal and breath sounds normal. No respiratory distress. She has no wheezes. She has no rales.  Abdominal: Soft. Bowel sounds are normal. She exhibits no distension. There is no tenderness. There is no rebound.  Lymphadenopathy:    She has no cervical adenopathy.  Skin: Skin is warm and dry. No erythema.          Assessment & Plan:

## 2011-06-25 NOTE — Telephone Encounter (Signed)
Patient has appointment at [facility] outpatient IM for 07/06/11 at 10:15 AM.  Unable to reach patient at her home number and no voice mail.  If I can not reach her tomorrow will send her a letter with appointment info. Wendall Mola CMA

## 2011-06-25 NOTE — Assessment & Plan Note (Addendum)
She continues to do wwell and takes her medicine as prescribed.  She continues on renally dosed Truvada and though her creat has improved some, her estimated GFR is about 50 which I still think is a bit of an overestimate due to her age and therefore, at this time, I will have her continue with the 3 times per week Truvada.  I will have her follow up in 6 months and have her see internal medicine in the meantime.

## 2011-06-25 NOTE — Patient Instructions (Signed)
Follow up with the internal medicine doctor

## 2011-06-25 NOTE — Assessment & Plan Note (Signed)
Despite all of the medicine, she continues to have elevated BP.  I will refer her to internal medicine to see if they can assist.  Her creatinine has remained stable.

## 2011-06-26 ENCOUNTER — Ambulatory Visit: Payer: Medicare Other | Admitting: Internal Medicine

## 2011-06-26 ENCOUNTER — Telehealth: Payer: Self-pay | Admitting: *Deleted

## 2011-06-26 NOTE — Telephone Encounter (Signed)
Patient and daughter-in- law, [name] notified of appointment date and time at [facility] outpatient clinic for 07/06/11 at 10:15 AM. Wendall Mola CMA

## 2011-07-06 ENCOUNTER — Encounter: Payer: Self-pay | Admitting: Internal Medicine

## 2011-07-06 ENCOUNTER — Ambulatory Visit (INDEPENDENT_AMBULATORY_CARE_PROVIDER_SITE_OTHER): Payer: Medicare Other | Admitting: Internal Medicine

## 2011-07-06 DIAGNOSIS — N289 Disorder of kidney and ureter, unspecified: Secondary | ICD-10-CM

## 2011-07-06 DIAGNOSIS — D638 Anemia in other chronic diseases classified elsewhere: Secondary | ICD-10-CM

## 2011-07-06 DIAGNOSIS — D649 Anemia, unspecified: Secondary | ICD-10-CM

## 2011-07-06 DIAGNOSIS — R011 Cardiac murmur, unspecified: Secondary | ICD-10-CM

## 2011-07-06 DIAGNOSIS — N259 Disorder resulting from impaired renal tubular function, unspecified: Secondary | ICD-10-CM

## 2011-07-06 DIAGNOSIS — I10 Essential (primary) hypertension: Secondary | ICD-10-CM

## 2011-07-06 DIAGNOSIS — B20 Human immunodeficiency virus [HIV] disease: Secondary | ICD-10-CM

## 2011-07-06 HISTORY — DX: Anemia in other chronic diseases classified elsewhere: D63.8

## 2011-07-06 LAB — T4, FREE: Free T4: 1.22 ng/dL (ref 0.80–1.80)

## 2011-07-06 LAB — TSH: TSH: 3.745 u[IU]/mL (ref 0.350–4.500)

## 2011-07-06 MED ORDER — HYDRALAZINE HCL 10 MG PO TABS: 10.0000 mg | ORAL_TABLET | Freq: Three times a day (TID) | ORAL | Status: DC

## 2011-07-06 NOTE — Assessment & Plan Note (Addendum)
Patient has persistent hypertension despite being on amlodipine 10 mg, valsartan/HCTZ 320-25 and clonidine 0.2 mg. we'll pursue further workup to rule out secondary hypertension by checking TSH, anemia panel, and aldosterone activity. Also order 2-D echo to evaluate for tricuspid regurg progression, given that her last echo was in 2007 showing moderate to severe tricuspid regurg which could be contributing to her symptoms. We'll also start the patient on hydralazine 10 mg 3 times a day for better control of her blood pressure. We'll bring the patient in one month for basic metabolic panel and recheck of her blood pressure.  After discussion with Dr. Josem Kaufmann, If 2-D echo is suggestive of pulmonary hypertension as the cause of tricuspid regurg, this could be HIV related pulmonary hypertension versus chronic PE related, and therefore pt would need a VQ scan to rule out chronic PE as this would significantly change the management of this patient.

## 2011-07-06 NOTE — Patient Instructions (Signed)
Please take any medication hydralazine as instructed, a prescription of which has been sent to her pharmacy

## 2011-07-06 NOTE — Assessment & Plan Note (Signed)
Patient has CKD stage III, appears to be stable. Continue to monitor periodically

## 2011-07-06 NOTE — Progress Notes (Signed)
Subjective:     Patient ID: Karen Luna, female   DOB: [birth year], 78 y.o.   MRN: [id number]  HPI  Patient is a pleasant 78 year old female with a past medical history significant for HIV with a CD4 count of 460, hypertension, stage III chronic kidney disease, and anemia presents to the clinic after being referred to [place] for resistant hypertension despite being on multiple medication including valsartan hydrochlorothiazide, amlodipine, clonidine. Patient denies any complaints and reports that she is feeling well, reports complete medication compliance. Denies any chest pain, fever, shortness of breath, wheezing or fatigue.   Review of Systems  All other systems reviewed and are negative.       Objective:   Physical Exam  Nursing note and vitals reviewed. Constitutional: She is oriented to person, place, and time. She appears well-developed and well-nourished.  HENT:  Head: Normocephalic and atraumatic.  Eyes: Pupils are equal, round, and reactive to light.  Neck: Normal range of motion. Neck supple. No JVD present. No thyromegaly present.  Cardiovascular: Normal rate and regular rhythm.  Exam reveals no gallop and no friction rub.   Murmur heard. Pulmonary/Chest: Effort normal and breath sounds normal. She has no wheezes. She has no rales.  Abdominal: Soft. Bowel sounds are normal.  Musculoskeletal: Normal range of motion. She exhibits no edema.  Neurological: She is alert and oriented to person, place, and time.  Skin: Skin is warm and dry.

## 2011-07-06 NOTE — Assessment & Plan Note (Signed)
Reasonably well-controlled, managed by infectious disease

## 2011-07-06 NOTE — Assessment & Plan Note (Signed)
Patient has a hemoglobin of 11.4, this appears to be stable, patient denies any GI bleed, we'll check an anemia panel and refer to GI for screening colonoscopy as she has not had one done yet.

## 2011-07-07 LAB — ANEMIA PANEL
%SAT: 14 % — ABNORMAL LOW (ref 20–55)
Iron: 43 ug/dL (ref 42–145)
Vitamin B-12: 535 pg/mL (ref 211–911)

## 2011-07-17 ENCOUNTER — Ambulatory Visit (HOSPITAL_COMMUNITY)
Admission: RE | Admit: 2011-07-17 | Discharge: 2011-07-17 | Disposition: A | Discharge status: Home / Self Care | Payer: Medicare Other | Source: Ambulatory Visit | Attending: Internal Medicine | Admitting: Internal Medicine

## 2011-07-17 ENCOUNTER — Other Ambulatory Visit: Payer: Self-pay | Admitting: *Deleted

## 2011-07-17 DIAGNOSIS — I059 Rheumatic mitral valve disease, unspecified: Secondary | ICD-10-CM

## 2011-07-17 DIAGNOSIS — I10 Essential (primary) hypertension: Secondary | ICD-10-CM | POA: Insufficient documentation

## 2011-07-17 DIAGNOSIS — R011 Cardiac murmur, unspecified: Secondary | ICD-10-CM | POA: Insufficient documentation

## 2011-07-17 NOTE — Progress Notes (Signed)
*  PRELIMINARY RESULTS* Echocardiogram 2D Echocardiogram has been performed.  Conrad Arial 07/17/2011, 11:16 AM

## 2011-07-18 MED ORDER — CLONIDINE HCL 0.2 MG PO TABS: 0.2000 mg | ORAL_TABLET | Freq: Two times a day (BID) | ORAL | Status: DC

## 2011-07-23 LAB — ALDOSTERONE + RENIN ACTIVITY W/ RATIO: PRA LC/MS/MS: 4.18

## 2011-08-03 ENCOUNTER — Other Ambulatory Visit: Payer: Self-pay | Admitting: Adult Health

## 2011-08-07 ENCOUNTER — Encounter: Payer: Self-pay | Admitting: Internal Medicine

## 2011-08-07 ENCOUNTER — Other Ambulatory Visit: Payer: Self-pay | Admitting: Internal Medicine

## 2011-08-07 ENCOUNTER — Ambulatory Visit (INDEPENDENT_AMBULATORY_CARE_PROVIDER_SITE_OTHER): Payer: Medicare Other | Admitting: Internal Medicine

## 2011-08-07 VITALS — BP 183/83 | HR 63 | Temp 97.0°F | Ht 66.0 in | Wt 191.0 lb

## 2011-08-07 DIAGNOSIS — I10 Essential (primary) hypertension: Secondary | ICD-10-CM

## 2011-08-07 DIAGNOSIS — Z23 Encounter for immunization: Secondary | ICD-10-CM

## 2011-08-07 DIAGNOSIS — Z0000 Encounter for general adult medical examination without abnormal findings: Secondary | ICD-10-CM

## 2011-08-07 MED ORDER — TETANUS-DIPHTH-ACELL PERTUSSIS 5-2.5-18.5 LF-MCG/0.5 IM SUSP: 0.5000 mL | Freq: Once | INTRAMUSCULAR | Status: DC

## 2011-08-07 MED ORDER — HYDRALAZINE HCL 10 MG PO TABS: 10.0000 mg | ORAL_TABLET | Freq: Three times a day (TID) | ORAL | Status: DC

## 2011-08-07 NOTE — Progress Notes (Signed)
Addended by: Merrie Roof A on: 08/07/2011 01:12 PM   Modules accepted: Orders

## 2011-08-07 NOTE — Patient Instructions (Addendum)
Please, take all your medications as prescribed. Please, note: new blood pressure medication called Hydralazine --called in to your pharmacy. Please, follow up in 1 month or sooner if needed and call with any questions.

## 2011-08-07 NOTE — Progress Notes (Signed)
Patient ID: Karen Luna, female   DOB: [birth year], 78 y.o.   MRN: [id number] HPI:     1. Follow up on her labs 2. HTn --denies any HA, visual/speech deficits, SOB, focal weakness, swelling or any other Sx.  Review of Systems: Negative except per history of present illness  Physical Exam:  Nursing notes and vitals reviewed General:  alert, well-developed, and cooperative to examination.   Lungs:  normal respiratory effort, no accessory muscle use, normal breath sounds, no crackles, and no wheezes. Heart:  normal rate, regular rhythm, no murmurs, no gallop, and no rub.   Abdomen:  soft, non-tender, normal bowel sounds, no distention, no guarding, no rebound tenderness, no hepatomegaly, and no splenomegaly.   Extremities:  No cyanosis, clubbing, edema Neurologic:  alert & oriented X3, nonfocal exam  Meds: Medications Prior to Admission  Medication Sig Dispense Refill  . amLODipine (NORVASC) 10 MG tablet Take 1 tablet (10 mg total) by mouth daily.  30 tablet  6  . cloNIDine (CATAPRES) 0.2 MG tablet Take 1 tablet (0.2 mg total) by mouth 2 (two) times daily.  60 tablet  3  . hydrALAZINE (APRESOLINE) 10 MG tablet Take 1 tablet (10 mg total) by mouth 3 (three) times daily.  90 tablet  3  . raltegravir (ISENTRESS) 400 MG tablet Take 1 tablet (400 mg total) by mouth 2 (two) times daily.  60 tablet  5  . TRUVADA 200-300 MG per tablet Take 1 tablet by mouth 3 (three) times a week,  every Monday, Wednesday & Friday  15 each  6  . valsartan-hydrochlorothiazide (DIOVAN-HCT) 320-25 MG per tablet Take 1 tablet by mouth daily.  30 tablet  6   No current facility-administered medications on file as of 08/07/2011.    Allergies: Review of patient's allergies indicates no known allergies. Past Medical History  Diagnosis Date  . Hypertension   . HIV infection    No past surgical history on file. Family History  Problem Relation Age of Onset  . Hypertension Mother   . Heart disease Mother   .  Hypertension Father   . Heart disease Father   . Hypertension Sister    History   Social History  . Marital Status: Widowed    Spouse Name: N/A    Number of Children: N/A  . Years of Education: N/A   Occupational History  . Not on file.   Social History Main Topics  . Smoking status: Never Smoker   . Smokeless tobacco: Never Used  . Alcohol Use: No  . Drug Use: No  . Sexually Active: Not Currently   Other Topics Concern  . Not on file   Social History Narrative  . No narrative on file    A/P: 1. HTN, poorly controlled -no evidence of hyperaldosternism -restart Hydralazine (patient was not aware that seh was supposed to take this med) -continue with clonidine, amlodipine, diovan-HCT -low salt diet -recheck BP in 4 weeks or sooner -instructed to call 911 and/or go to ED if HA, SOB, CP, edema or any other Sx.  2. Normocytic anemia -MCV is normal a dn TIBC is not appropriately increased, despite Iron level being in lower normal range -repeat CBC in 3 months. If worsened anemia --need a diagnostic colonosocopy (life-expectancy >10 years).

## 2011-08-24 ENCOUNTER — Other Ambulatory Visit: Payer: Self-pay | Admitting: Internal Medicine

## 2011-09-04 ENCOUNTER — Ambulatory Visit (INDEPENDENT_AMBULATORY_CARE_PROVIDER_SITE_OTHER): Payer: Medicare Other | Admitting: Ophthalmology

## 2011-09-04 VITALS — BP 142/77 | HR 58 | Temp 97.0°F | Wt 193.5 lb

## 2011-09-04 DIAGNOSIS — I10 Essential (primary) hypertension: Secondary | ICD-10-CM

## 2011-09-04 DIAGNOSIS — B20 Human immunodeficiency virus [HIV] disease: Secondary | ICD-10-CM

## 2011-09-04 DIAGNOSIS — D649 Anemia, unspecified: Secondary | ICD-10-CM

## 2011-09-04 MED ORDER — CLONIDINE HCL 0.2 MG PO TABS: 0.2000 mg | ORAL_TABLET | Freq: Two times a day (BID) | ORAL | Status: DC

## 2011-09-04 NOTE — Assessment & Plan Note (Signed)
Patient's ferritin is elevated at 166 so this likely is anemia of chronic disease which could be due to HIV or renal disease. I discussed with Dr. Josem Kaufmann and we do not feel that colonoscopy is indicated unless there is acute concern. She is 78 years old and her life expectancy is close to 10 years and she has no concerning symptoms.

## 2011-09-04 NOTE — Assessment & Plan Note (Addendum)
Patient's BP is now well controlled with addition of hydralazine. She denies any dizziness. Will continue to monitor her although she seems confused about the need to see [place] and infectious disease clinic.

## 2011-09-04 NOTE — Progress Notes (Signed)
Subjective:   Patient ID: Karen Luna female   DOB: Nov 04, 1931 78 y.o.   MRN: [id number]  HPI: [name] is a 78 y.o. woman who is here for follow up for blood pressure. She says she has been taking the hydralazine everyday though she brings all of her medication bottles except for the hydralazine. She reports taking 5 medications on MWF and  4 on T/Th. She is taking clonidine and isentress at night, which leaves amlodipine, hydralazine, valsartan/HCTZ, and isentress (whih is BID) so it seems like she is taking her meds as prescribed. No dizziness or headaches. She also describes having one bottle with both pink & white pills in it which is why she initially thought that the hydralazine and the clonidine were the same medication. I asked her to bring this bottle next time so we can take a look.  She wanted to know results of echocardiogram. I told her that EF was normal at 65%  RHM-colonoscopy- no fm hx of colon cancer, no constipation, no blood in stool, no black colored stool. hgb was 11.4 but ferritin was 166, so likely related to chronic disease.  HIV-patient is due to see infectious disease in September  Past Medical History  Diagnosis Date  . Hypertension   . HIV infection    Current Outpatient Prescriptions  Medication Sig Dispense Refill  . amLODipine (NORVASC) 10 MG tablet Take 1 tablet (10 mg total) by mouth daily.  30 tablet  6  . cloNIDine (CATAPRES) 0.2 MG tablet Take 1 tablet (0.2 mg total) by mouth 2 (two) times daily.  60 tablet  6  . hydrALAZINE (APRESOLINE) 10 MG tablet Take 1 tablet (10 mg total) by mouth 3 (three) times daily.  90 tablet  3  . ISENTRESS 400 MG tablet Take 1 tablet (400 mg total) by mouth 2 (two) times daily.  60 each  4  . TRUVADA 200-300 MG per tablet Take 1 tablet by mouth 3 (three) times a week,  every Monday, Wednesday & Friday  15 each  6  . valsartan-hydrochlorothiazide (DIOVAN-HCT) 320-25 MG per tablet Take 1 tablet by mouth daily.   30 tablet  6  . DISCONTD: cloNIDine (CATAPRES) 0.2 MG tablet Take 1 tablet (0.2 mg total) by mouth 2 (two) times daily.  60 tablet  3   Current Facility-Administered Medications  Medication Dose Route Frequency Provider Last Rate Last Dose  . TDaP (BOOSTRIX) injection 0.5 mL  0.5 mL Intramuscular Once Denna Haggard, MD       Family History  Problem Relation Age of Onset  . Hypertension Mother   . Heart disease Mother   . Hypertension Father   . Heart disease Father   . Hypertension Sister    History   Social History  . Marital Status: Widowed    Spouse Name: N/A    Number of Children: N/A  . Years of Education: N/A   Social History Main Topics  . Smoking status: Former Smoker    Types: Cigarettes    Quit date: 08/06/1988  . Smokeless tobacco: Never Used  . Alcohol Use: No  . Drug Use: No  . Sexually Active: Not Currently   Other Topics Concern  . Not on file   Social History Narrative  . No narrative on file    Objective:  Physical Exam: Filed Vitals:   09/04/11 0904  BP: 142/77  Pulse: 58  Temp: 97 F (36.1 C)  Weight: 193 lb 8 oz (87.771 kg)  General: pleasant elderly woman with magenta hair sitting in chair in no apparent distress HEENT: PERRL, EOMI, no scleral icterus Cardiac: RRR, no rubs, murmurs or gallops Pulm: clear to auscultation bilaterally, moving normal volumes of air Abd: soft, nontender, nondistended, BS present Ext: warm and well perfused, 2+ pitting pedal edema present bilaterally Neuro: alert and oriented X3, cranial nerves II-XII grossly intact Psych: patient is somewhat obstinate and argues with me on several points about her medications  Assessment & Plan:

## 2011-09-04 NOTE — Assessment & Plan Note (Signed)
Was well controlled on last visit with Dr. Luciana Axe in February. Reminded her to schedule appt to see him in September.

## 2011-09-04 NOTE — Patient Instructions (Signed)
-  Keep taking all of your blood pressure medications- 1)hydralazine 2)clonidine 3) amlodipine) and 4) HCTZ/Diovan -Please call infectious disease clinic and schedule appt for September -Can see [place] back here at internal medicine clinic in 6 months

## 2011-10-08 ENCOUNTER — Other Ambulatory Visit: Payer: Self-pay | Admitting: Internal Medicine

## 2011-10-08 DIAGNOSIS — IMO0002 Reserved for concepts with insufficient information to code with codable children: Secondary | ICD-10-CM

## 2011-10-08 NOTE — Progress Notes (Signed)
Previous ASCUS Pap smear on 05/24/10 done by Dr. Argentina Donovan at [facility].  No follow-up found in record.  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  Will route to E. Epperson, RN, Referral Coordinator, [facility].

## 2011-11-07 ENCOUNTER — Encounter: Payer: Medicare Other | Admitting: Physician Assistant

## 2011-11-08 ENCOUNTER — Encounter: Payer: Medicare Other | Admitting: Physician Assistant

## 2011-11-25 ENCOUNTER — Other Ambulatory Visit: Payer: Self-pay | Admitting: Internal Medicine

## 2011-11-25 DIAGNOSIS — I10 Essential (primary) hypertension: Secondary | ICD-10-CM

## 2011-11-30 ENCOUNTER — Encounter: Payer: Medicare Other | Admitting: Obstetrics & Gynecology

## 2011-12-11 ENCOUNTER — Other Ambulatory Visit: Payer: Medicare Other

## 2011-12-14 ENCOUNTER — Other Ambulatory Visit (INDEPENDENT_AMBULATORY_CARE_PROVIDER_SITE_OTHER): Payer: Medicare Other

## 2011-12-14 DIAGNOSIS — B20 Human immunodeficiency virus [HIV] disease: Secondary | ICD-10-CM

## 2011-12-14 LAB — COMPREHENSIVE METABOLIC PANEL
BUN: 22 mg/dL (ref 6–23)
CO2: 21 mEq/L (ref 19–32)
Creat: 1.27 mg/dL — ABNORMAL HIGH (ref 0.50–1.10)
Glucose, Bld: 101 mg/dL — ABNORMAL HIGH (ref 70–99)
Total Bilirubin: 0.3 mg/dL (ref 0.3–1.2)

## 2011-12-14 LAB — CBC WITH DIFFERENTIAL/PLATELET
Eosinophils Absolute: 0.2 10*3/uL (ref 0.0–0.7)
Eosinophils Relative: 2 % (ref 0–5)
Lymphs Abs: 2.1 10*3/uL (ref 0.7–4.0)
MCH: 32.4 pg (ref 26.0–34.0)
MCV: 93 fL (ref 78.0–100.0)
Monocytes Absolute: 0.6 10*3/uL (ref 0.1–1.0)
Monocytes Relative: 9 % (ref 3–12)
Platelets: 414 10*3/uL — ABNORMAL HIGH (ref 150–400)
RBC: 3.58 MIL/uL — ABNORMAL LOW (ref 3.87–5.11)

## 2011-12-17 LAB — HIV-1 RNA QUANT-NO REFLEX-BLD
HIV 1 RNA Quant: <20 copies/mL (ref <20)
HIV-1 RNA Quant, Log: <1.3 {Log} (ref <1.30)

## 2011-12-25 ENCOUNTER — Ambulatory Visit: Payer: Medicare Other | Admitting: Internal Medicine

## 2011-12-26 ENCOUNTER — Other Ambulatory Visit: Payer: Self-pay | Admitting: Internal Medicine

## 2011-12-28 ENCOUNTER — Ambulatory Visit (INDEPENDENT_AMBULATORY_CARE_PROVIDER_SITE_OTHER): Payer: Medicare Other | Admitting: Internal Medicine

## 2011-12-28 ENCOUNTER — Encounter: Payer: Self-pay | Admitting: Internal Medicine

## 2011-12-28 VITALS — BP 181/83 | HR 98 | Temp 97.8°F | Ht 67.0 in | Wt 184.0 lb

## 2011-12-28 DIAGNOSIS — Z113 Encounter for screening for infections with a predominantly sexual mode of transmission: Secondary | ICD-10-CM

## 2011-12-28 DIAGNOSIS — B20 Human immunodeficiency virus [HIV] disease: Secondary | ICD-10-CM

## 2011-12-28 NOTE — Progress Notes (Signed)
  Subjective:    Patient ID: Karen Luna, female    DOB: June 08, 1931, 78 y.o.   MRN: [id number]  HPI She comes in for her routine followup.  She continues to take Isentress and Truvada 3 times a week.  She continues to have a good CD4 count and an undetectable viral load. She denies any missed doses, no recent hospitalizations or other issues. She remains quite active and has no new problems.   Review of Systems  Constitutional: Negative for fever, chills, fatigue and unexpected weight change.  HENT: Negative for sore throat and trouble swallowing.   Respiratory: Negative for cough, shortness of breath and wheezing.   Cardiovascular: Negative for chest pain, palpitations and leg swelling.  Gastrointestinal: Negative for nausea, abdominal pain and diarrhea.  Musculoskeletal: Negative for myalgias, joint swelling and arthralgias.  Skin: Negative for rash.  Neurological: Negative for dizziness, light-headedness and headaches.       Objective:   Physical Exam  Constitutional: She appears well-developed and well-nourished. No distress.  HENT:  Mouth/Throat: Oropharynx is clear and moist. No oropharyngeal exudate.  Cardiovascular: Normal rate, regular rhythm and normal heart sounds.  Exam reveals no gallop and no friction rub.   No murmur heard. Pulmonary/Chest: Effort normal and breath sounds normal.  Abdominal: Soft. Bowel sounds are normal. She exhibits no distension. There is no tenderness. There is no rebound.  Lymphadenopathy:    She has no cervical adenopathy.          Assessment & Plan:

## 2011-12-28 NOTE — Assessment & Plan Note (Signed)
She continues to do well her current regimen. Her creatinine clearance has decreased slightly and therefore I feel comfortable continuing with 3 times a week Truvada. I will check an HLA B. 5701 next visit in case the changes are needed. She will get her screening lipid panel and screening Pap smear from her primary physician. She was encouraged to contact her primary physician to continue to monitor her blood pressure and other medical issues. During the clinic in 6 months.

## 2012-01-24 ENCOUNTER — Other Ambulatory Visit: Payer: Self-pay | Admitting: *Deleted

## 2012-01-24 DIAGNOSIS — B20 Human immunodeficiency virus [HIV] disease: Secondary | ICD-10-CM

## 2012-01-24 MED ORDER — RALTEGRAVIR POTASSIUM 400 MG PO TABS: 400.0000 mg | ORAL_TABLET | Freq: Two times a day (BID) | ORAL | Status: DC

## 2012-01-24 MED ORDER — EMTRICITABINE-TENOFOVIR DF 200-300 MG PO TABS: 1.0000 | ORAL_TABLET | ORAL | Status: DC

## 2012-02-21 ENCOUNTER — Other Ambulatory Visit: Payer: Self-pay | Admitting: *Deleted

## 2012-02-21 DIAGNOSIS — I10 Essential (primary) hypertension: Secondary | ICD-10-CM

## 2012-02-21 MED ORDER — AMLODIPINE BESYLATE 10 MG PO TABS: 10.0000 mg | ORAL_TABLET | Freq: Every day | ORAL | Status: DC

## 2012-02-28 ENCOUNTER — Other Ambulatory Visit: Payer: Self-pay | Admitting: *Deleted

## 2012-02-28 DIAGNOSIS — I10 Essential (primary) hypertension: Secondary | ICD-10-CM

## 2012-02-28 MED ORDER — HYDRALAZINE HCL 10 MG PO TABS: 10.0000 mg | ORAL_TABLET | Freq: Three times a day (TID) | ORAL | Status: DC

## 2012-05-21 ENCOUNTER — Other Ambulatory Visit: Payer: Self-pay | Admitting: Internal Medicine

## 2012-05-21 NOTE — Telephone Encounter (Signed)
She also needs an appointment to be seen.  She hasn't been seen since May 2013, and apparently was confused re: her medicines at that time.    Can you please call and schedule her to see me, or if no spots, one of the residents?   Thanks  EB Karen Luna

## 2012-05-23 ENCOUNTER — Other Ambulatory Visit (HOSPITAL_COMMUNITY)
Admission: RE | Admit: 2012-05-23 | Discharge: 2012-05-23 | Disposition: A | Discharge status: Home / Self Care | Payer: Medicare Other | Source: Ambulatory Visit | Attending: Infectious Diseases | Admitting: Infectious Diseases

## 2012-05-23 ENCOUNTER — Ambulatory Visit (INDEPENDENT_AMBULATORY_CARE_PROVIDER_SITE_OTHER): Payer: Medicare Other | Admitting: *Deleted

## 2012-05-23 ENCOUNTER — Other Ambulatory Visit: Payer: Medicare Other

## 2012-05-23 DIAGNOSIS — R6889 Other general symptoms and signs: Secondary | ICD-10-CM

## 2012-05-23 DIAGNOSIS — R87810 Cervical high risk human papillomavirus (HPV) DNA test positive: Secondary | ICD-10-CM | POA: Insufficient documentation

## 2012-05-23 DIAGNOSIS — Z124 Encounter for screening for malignant neoplasm of cervix: Secondary | ICD-10-CM | POA: Insufficient documentation

## 2012-05-23 DIAGNOSIS — N879 Dysplasia of cervix uteri, unspecified: Secondary | ICD-10-CM

## 2012-05-23 DIAGNOSIS — B20 Human immunodeficiency virus [HIV] disease: Secondary | ICD-10-CM

## 2012-05-23 DIAGNOSIS — N760 Acute vaginitis: Secondary | ICD-10-CM | POA: Insufficient documentation

## 2012-05-23 DIAGNOSIS — Z1151 Encounter for screening for human papillomavirus (HPV): Secondary | ICD-10-CM | POA: Insufficient documentation

## 2012-05-23 DIAGNOSIS — Z23 Encounter for immunization: Secondary | ICD-10-CM

## 2012-05-23 DIAGNOSIS — Z113 Encounter for screening for infections with a predominantly sexual mode of transmission: Secondary | ICD-10-CM

## 2012-05-23 LAB — COMPLETE METABOLIC PANEL WITH GFR
ALT: 16 U/L (ref 0–35)
CO2: 19 mEq/L (ref 19–32)
Calcium: 10.1 mg/dL (ref 8.4–10.5)
Chloride: 108 mEq/L (ref 96–112)
GFR, Est African American: 44 mL/min — ABNORMAL LOW
Sodium: 139 mEq/L (ref 135–145)
Total Bilirubin: 0.3 mg/dL (ref 0.3–1.2)
Total Protein: 8.2 g/dL (ref 6.0–8.3)

## 2012-05-23 LAB — CBC WITH DIFFERENTIAL/PLATELET
Basophils Relative: 0 % (ref 0–1)
Eosinophils Absolute: 0.1 10*3/uL (ref 0.0–0.7)
Eosinophils Relative: 2 % (ref 0–5)
Hemoglobin: 11.6 g/dL — ABNORMAL LOW (ref 12.0–15.0)
MCH: 33 pg (ref 26.0–34.0)
MCHC: 35.6 g/dL (ref 30.0–36.0)
MCV: 92.9 fL (ref 78.0–100.0)
Monocytes Absolute: 0.7 10*3/uL (ref 0.1–1.0)
Monocytes Relative: 11 % (ref 3–12)
Neutrophils Relative %: 54 % (ref 43–77)

## 2012-05-23 LAB — RPR

## 2012-05-23 NOTE — Patient Instructions (Signed)
Your results will be ready in about a week.  I will be in touch when they are available.  Thank you for coming to the Center for your care.  Angelique Blonder, RN

## 2012-05-23 NOTE — Progress Notes (Signed)
  Subjective:     Karen Luna is a 79 y.o. woman who comes in today for a  pap smear only.  Her most recent Pap smear was on 05/24/10 and showed ASCUS results. Previous abnormal Pap smears: yes. Contraception: menopausal  Pelvic Exam:  Pap smear obtained.   Assessment:    Screening pap smear.   Plan:    Follow up in one year, or as indicated by Pap results.

## 2012-05-25 LAB — HIV-1 RNA QUANT-NO REFLEX-BLD: HIV 1 RNA Quant: <20 copies/mL (ref <20)

## 2012-06-03 NOTE — Addendum Note (Signed)
Addended by: Jennet Maduro D on: 06/03/2012 04:00 PM   Modules accepted: Orders

## 2012-06-04 ENCOUNTER — Encounter: Payer: Self-pay | Admitting: Obstetrics & Gynecology

## 2012-06-06 ENCOUNTER — Telehealth: Payer: Self-pay | Admitting: *Deleted

## 2012-06-06 ENCOUNTER — Ambulatory Visit (INDEPENDENT_AMBULATORY_CARE_PROVIDER_SITE_OTHER): Payer: Medicare Other | Admitting: Internal Medicine

## 2012-06-06 ENCOUNTER — Encounter: Payer: Self-pay | Admitting: Internal Medicine

## 2012-06-06 VITALS — BP 181/68 | HR 77 | Temp 97.6°F | Wt 181.0 lb

## 2012-06-06 DIAGNOSIS — N259 Disorder resulting from impaired renal tubular function, unspecified: Secondary | ICD-10-CM

## 2012-06-06 DIAGNOSIS — I10 Essential (primary) hypertension: Secondary | ICD-10-CM

## 2012-06-06 DIAGNOSIS — R87612 Low grade squamous intraepithelial lesion on cytologic smear of cervix (LGSIL): Secondary | ICD-10-CM

## 2012-06-06 DIAGNOSIS — Z79899 Other long term (current) drug therapy: Secondary | ICD-10-CM

## 2012-06-06 DIAGNOSIS — B20 Human immunodeficiency virus [HIV] disease: Secondary | ICD-10-CM

## 2012-06-06 DIAGNOSIS — Z113 Encounter for screening for infections with a predominantly sexual mode of transmission: Secondary | ICD-10-CM

## 2012-06-06 NOTE — Telephone Encounter (Signed)
Called to confirm patient's PCP appointment change to Friday, 06/16/12 at 9:15.  Patient unable to confirm, spoke with her daughter-in-law at her request and got confirmation. Notified [facility]. Andree Coss, RN

## 2012-06-06 NOTE — Progress Notes (Signed)
  Subjective:    Patient ID: Karen Luna, female    DOB: 06-Nov-1931, 79 y.o.   MRN: [id number]  HPI She comes in for routine follow up of HIV. She continues to take Isentress and Truvada every Monday Wednesday and Friday. She does have a GFR below 50. She denies any missed doses and she is aware of what medications she takes for her HIV. She has no complaints and feels well. Her blood pressure is elevated though improved from previous. She does take clonidine however is adamant that since it is a different looking pill that it is not the same and is now not taking it.   Review of Systems  Constitutional: Negative for fever, appetite change and fatigue.  HENT: Negative for sore throat and trouble swallowing.   Gastrointestinal: Negative for nausea, abdominal pain and diarrhea.  Musculoskeletal: Negative for myalgias and arthralgias.  Neurological: Negative for dizziness and headaches.       Objective:   Physical Exam  Constitutional: She appears well-developed and well-nourished. No distress.  HENT:  Mouth/Throat: Oropharynx is clear and moist. No oropharyngeal exudate.  Cardiovascular: Normal rate, regular rhythm and normal heart sounds.  Exam reveals no gallop and no friction rub.   No murmur heard. Pulmonary/Chest: Effort normal and breath sounds normal. No respiratory distress. She has no wheezes. She has no rales.          Assessment & Plan:

## 2012-06-06 NOTE — Assessment & Plan Note (Signed)
She is aware of ASCUS and was referred to [facility] however she refuses to go and does not want any interventions.

## 2012-06-06 NOTE — Assessment & Plan Note (Signed)
Her creatinine is stable and no dose adjustments required. I will continue to monitor this every 4 months

## 2012-06-06 NOTE — Assessment & Plan Note (Signed)
Doing well with her current therapy he'll have her return in about 4 months continue to monitor closely

## 2012-06-06 NOTE — Assessment & Plan Note (Signed)
She has an appointment coming up with her primary physician to help monitor her hypertension. She did stop taking her clonidine because she felt it was not truly clonidine because it was a different company. Despite education she continues to refuse clonidine

## 2012-06-10 ENCOUNTER — Encounter: Payer: Medicare Other | Admitting: Internal Medicine

## 2012-06-13 ENCOUNTER — Ambulatory Visit: Payer: Medicare Other | Admitting: Internal Medicine

## 2012-06-23 ENCOUNTER — Encounter: Payer: Medicare Other | Admitting: Obstetrics & Gynecology

## 2012-06-23 NOTE — Addendum Note (Signed)
Addended by: Jennet Maduro D on: 06/23/2012 05:09 PM   Modules accepted: Orders

## 2012-07-09 ENCOUNTER — Other Ambulatory Visit: Payer: Self-pay | Admitting: *Deleted

## 2012-07-09 DIAGNOSIS — I10 Essential (primary) hypertension: Secondary | ICD-10-CM

## 2012-07-10 MED ORDER — HYDRALAZINE HCL 10 MG PO TABS: 10.0000 mg | ORAL_TABLET | Freq: Three times a day (TID) | ORAL | Status: DC

## 2012-07-10 MED ORDER — CLONIDINE HCL 0.2 MG PO TABS: 0.2000 mg | ORAL_TABLET | Freq: Two times a day (BID) | ORAL | Status: DC

## 2012-07-25 ENCOUNTER — Encounter: Payer: Self-pay | Admitting: Internal Medicine

## 2012-07-25 ENCOUNTER — Ambulatory Visit (INDEPENDENT_AMBULATORY_CARE_PROVIDER_SITE_OTHER): Payer: Medicare Other | Admitting: Internal Medicine

## 2012-07-25 VITALS — BP 180/86 | HR 57 | Temp 97.5°F | Wt 181.5 lb

## 2012-07-25 DIAGNOSIS — Z0000 Encounter for general adult medical examination without abnormal findings: Secondary | ICD-10-CM | POA: Insufficient documentation

## 2012-07-25 DIAGNOSIS — N259 Disorder resulting from impaired renal tubular function, unspecified: Secondary | ICD-10-CM

## 2012-07-25 DIAGNOSIS — I119 Hypertensive heart disease without heart failure: Secondary | ICD-10-CM

## 2012-07-25 DIAGNOSIS — B20 Human immunodeficiency virus [HIV] disease: Secondary | ICD-10-CM

## 2012-07-25 DIAGNOSIS — I10 Essential (primary) hypertension: Secondary | ICD-10-CM

## 2012-07-25 DIAGNOSIS — R87612 Low grade squamous intraepithelial lesion on cytologic smear of cervix (LGSIL): Secondary | ICD-10-CM

## 2012-07-25 DIAGNOSIS — E663 Overweight: Secondary | ICD-10-CM

## 2012-07-25 HISTORY — DX: Overweight: E66.3

## 2012-07-25 HISTORY — DX: Hypertensive heart disease without heart failure: I11.9

## 2012-07-25 NOTE — Assessment & Plan Note (Signed)
As noted above this is also likely secondary to her uncontrolled hypertension. I counseled her on the importance of blood pressure control and she will consider my request to allow more aggressive antihypertensive therapy.

## 2012-07-25 NOTE — Assessment & Plan Note (Signed)
She is followed closely by the [facility] for Infectious Diseases. Her last CD4 count was 360 and her viral load was less than 20 on 05/23/2012. She states she's been compliant with the Truvada and Isentress and is tolerating them well. She will continue this therapy and followup with the [facility] for Infectious Diseases.

## 2012-07-25 NOTE — Assessment & Plan Note (Signed)
As noted above, blood pressure control will be essential in managing her chronic kidney disease. She will be thinking about my request to be more aggressive with her blood pressure control over the next 3 months. In the meantime, she is on high-dose valsartan which should be protective, to some degree, given her current stage III chronic kidney disease.

## 2012-07-25 NOTE — Progress Notes (Signed)
  Subjective:    Patient ID: Karen Luna, female    DOB: November 30, 1931, 79 y.o.   MRN: [id number]  HPI  Please see the A&P for the status of the pt's chronic medical problems.  Review of Systems  Constitutional: Negative for activity change, appetite change and unexpected weight change.  Respiratory: Negative for shortness of breath and wheezing.   Cardiovascular: Negative for leg swelling.  Gastrointestinal: Negative for abdominal pain.  Genitourinary: Negative for vaginal bleeding.  Musculoskeletal: Negative for myalgias, back pain, joint swelling, arthralgias and gait problem.  Skin: Negative for rash.  Neurological: Negative for seizures and weakness.  Psychiatric/Behavioral: Negative for dysphoric mood. The patient is not nervous/anxious.       Objective:   Physical Exam  Nursing note and vitals reviewed. Constitutional: She is oriented to person, place, and time. She appears well-developed and well-nourished. No distress.  HENT:  Head: Normocephalic and atraumatic.  Eyes: Conjunctivae are normal. Right eye exhibits no discharge. Left eye exhibits no discharge. No scleral icterus.  Neck: Normal range of motion. Neck supple.  Cardiovascular: Normal rate and regular rhythm.  Exam reveals no gallop and no friction rub.   Murmur heard. Pulmonary/Chest: Effort normal and breath sounds normal. No respiratory distress. She has no wheezes. She has no rales.  Abdominal: Soft. Bowel sounds are normal. She exhibits no distension. There is no tenderness. There is no rebound and no guarding.  Musculoskeletal: Normal range of motion. She exhibits no edema and no tenderness.  Neurological: She is alert and oriented to person, place, and time.  Skin: Skin is warm and dry. No rash noted. She is not diaphoretic. No erythema. No pallor.  Psychiatric: She has a normal mood and affect. Her behavior is normal. Judgment and thought content normal.      Assessment & Plan:   Please see problem  oriented charting.

## 2012-07-25 NOTE — Patient Instructions (Signed)
It was nice to meet you.  I look forward to taking care of you for years to come.  1) Keep taking all of your medications like you are.  2) Think about letting me treat your blood pressure in the future.  I am concerned about your kidney function.  I will see you back in three months to talk about your blood pressure and kidneys.

## 2012-07-25 NOTE — Assessment & Plan Note (Signed)
Her blood pressure is persistently elevated on the amlodipine 10 mg by mouth daily, hydralazine 10 mg by mouth 3 times a day, and valsartan-hydrochlorothiazide 320-25 mg by mouth daily. She stopped taking the clonidine. I discussed the importance of blood pressure control given her underlying chronic kidney disease. I recommended we increase the hydralazine dose. She refused my request to increase the hydralazine dose and wanted to continue on her 3 medications. We had a long discussion about how this could be detrimental to her kidney function moving forward and I asked her to think very hard over the next 3 months about allowing me to be more aggressive with her blood pressure control in order to protect her kidney function. She stated she would consider my request. We will reevaluate her blood pressure control in 3 months. Given my concerns for compliance with the clonidine I think it is best we not restart it because of its potential for rebound hypertension.

## 2012-07-25 NOTE — Assessment & Plan Note (Signed)
Her most recent Pap smears were positive for atypical cells of unknown significance. A referral in February has been made to a gynecologist for further evaluation. At followup we will make sure she has followed up with this evaluation given the recurrent ASCUS in the setting of high risk HPV and HIV.

## 2012-07-30 ENCOUNTER — Other Ambulatory Visit: Payer: Self-pay | Admitting: *Deleted

## 2012-07-30 DIAGNOSIS — I10 Essential (primary) hypertension: Secondary | ICD-10-CM

## 2012-07-30 MED ORDER — VALSARTAN-HYDROCHLOROTHIAZIDE 320-25 MG PO TABS: 1.0000 | ORAL_TABLET | Freq: Every day | ORAL | Status: DC

## 2012-08-04 ENCOUNTER — Other Ambulatory Visit: Payer: Self-pay | Admitting: *Deleted

## 2012-08-04 DIAGNOSIS — B20 Human immunodeficiency virus [HIV] disease: Secondary | ICD-10-CM

## 2012-08-05 MED ORDER — RALTEGRAVIR POTASSIUM 400 MG PO TABS: 400.0000 mg | ORAL_TABLET | Freq: Two times a day (BID) | ORAL | Status: DC

## 2012-08-05 MED ORDER — EMTRICITABINE-TENOFOVIR DF 200-300 MG PO TABS: 1.0000 | ORAL_TABLET | ORAL | Status: DC

## 2012-08-11 ENCOUNTER — Ambulatory Visit (INDEPENDENT_AMBULATORY_CARE_PROVIDER_SITE_OTHER): Payer: Medicare Other | Admitting: Obstetrics & Gynecology

## 2012-08-11 ENCOUNTER — Other Ambulatory Visit: Payer: Self-pay | Admitting: Obstetrics & Gynecology

## 2012-08-11 ENCOUNTER — Encounter: Payer: Self-pay | Admitting: Obstetrics & Gynecology

## 2012-08-11 VITALS — BP 140/80 | Ht 67.0 in | Wt 187.0 lb

## 2012-08-11 DIAGNOSIS — N893 Dysplasia of vagina, unspecified: Secondary | ICD-10-CM

## 2012-08-11 DIAGNOSIS — R87612 Low grade squamous intraepithelial lesion on cytologic smear of cervix (LGSIL): Secondary | ICD-10-CM

## 2012-08-11 DIAGNOSIS — Z8541 Personal history of malignant neoplasm of cervix uteri: Secondary | ICD-10-CM

## 2012-08-11 HISTORY — DX: Personal history of malignant neoplasm of cervix uteri: Z85.41

## 2012-08-11 NOTE — Patient Instructions (Addendum)
Cervical Dysplasia Cervical dysplasia is a condition in which a woman has abnormal changes in the cells of her cervix. The cervix is the opening to the uterus (womb) between the vagina and the uterus. These changes are called cervical dysplasia and may be the first signs of cervical cancer. These cells can be taken from the cervix during a Pap test and then looked at under a microscope. With early detection, treatment, and close follow-up care, nearly all cervical dysplasia can be cured. If untreated, the mild to moderate stages of dysplasia often grow more severe.  RISK FACTORS  The following increase the risk for cervical dysplasia.  Having had a sexually transmitted disease, including:  Chlamydia.  Human papilloma virus (HPV).  Becoming sexually active before age 18.  Having had more than 1 sexual partner.  Not using protection, such as condoms, during sexual intercourse, especially with new sexual partners.  Having had cancer of the vagina or vulva.  Having a sexual partner whose previous partner had cancer of the cervix or cervical dysplasia.  Having a sexual partner who has or has had cancer of the penis.  Having a weakened immune system (HIV, organ transplant).  Being the daughter of a woman who took DES (diethylstilbestrol) during pregnancy.  A history of cervical cancer in a woman's sister or mother.  Smoking.  Having had an abnormal Pap test in the past. SYMPTOMS  There are usually no symptoms. If there are symptoms, they may be vague such as:  Abnormal vaginal discharge.  Bleeding between periods or following intercourse.  Bleeding during menopause.  Pain on intercourse (dyspareunia). DIAGNOSIS   The Pap test is the best way of detecting abnormalities of the cervix.  Biopsy (removing a piece of tissue to look at under the microscope) of the cervix when the Pap test is abnormal or when the Pap test is normal, but the cervix looks abnormal. TREATMENT    Catching and treating the changes early with Pap tests can prevent cervical cancer.  Cryotherapy freezes the abnormal cells with a steel tip instrument.  A laser can be used to remove the abnormal cells.  Loop electrocautery excision procedure (LEEP). This procedure uses a heated electrical loop to remove a cone-like portion of the cervix, including the cervical canal.  For more serious cases of cervical dysplasia, the abnormal tissue may be removed surgically by:  A cone biopsy (by cold knife, laser or LEEP). A procedure in which a portion of the center of the cervix with the cervical canal is removed.  The uterus and cervix are removed (hysterectomy). Your caregiver will advise you regarding the need and timing of Pap tests in your follow-up. Women who have been treated for dysplasia should be closely followed with pelvic exams and Pap tests. During the first year following treatment of cervical dysplasia, Pap tests should be done every 3 to 4 months. In the second year, the schedule is every 6 months, or as recommended by your caregiver. See your caregiver for new or worsening problems. HOME CARE INSTRUCTIONS   Follow the instructions and recommendations of your caregiver regarding medicines and follow-up appointments.  Only take over-the-counter or prescription medicines for pain or discomfort as directed by your caregiver.  Cramping and pelvic discomfort may follow cryotherapy. It is not abnormal to have watery discharge for several weeks after.  Laser, cone surgery, cryotherapy or LEEP can cause a bad smelling vaginal discharge. It may also cause vaginal bleeding for a couple weeks following the procedure. The   discharge may be black from the paste used to control bleeding from the cone site. This is normal.  Do not use tampons, have sexual intercourse or douche until your caregiver says it is okay. SEEK MEDICAL CARE IF:   You develop genital warts.  You need a prescription for  pain medicine following your treatment. SEEK IMMEDIATE MEDICAL CARE IF:   Your bleeding is heavier than a normal menstrual period.  You develop bright red bleeding, especially if you have blood clots.  You have a fever.  You have increasing cramps or pain not relieved with medicine.  You are lightheaded, unusually weak, or have fainting spells.  You have abnormal vaginal discharge.  You develop abdominal pain. PREVENTION   The surest way to prevent cervical dysplasia is to abstain from sexual intercourse.  Practice safe sex, use condoms and have only one sex partner who does not have other sex partners.  A Pap test is done to screen for cervical cancer.  The first Pap test should be done at age 21.  Between ages 21 and 29, Pap tests are repeated every 2 years.  Beginning at age 30, you are advised to have a Pap test every 3 years as long as your past 3 Pap tests have been normal.  Some women have medical problems that increase the chance of getting cervical cancer. Talk to your caregiver about these problems. It is especially important to talk to your caregiver if a new problem develops soon after your last Pap test. In these cases, your caregiver may recommend more frequent screening and Pap tests.  The above recommendations are the same for women who have or have not gotten the vaccine for HPV (Human Papillomavirus).  If you had a hysterectomy for a problem that was not a cancer or a condition that could lead to cancer, then you no longer need Pap tests. However, even if you no longer need a Pap test, a regular exam is a good idea to make sure no other problems are starting.   If you are between ages 65 and 70, and you have had normal Pap tests going back 10 years, you no longer need Pap tests. However, even if you no longer need a Pap test, a regular exam is a good idea to make sure no other problems are starting.   If you have had past treatment for cervical cancer or a  condition that could lead to cancer, you need Pap tests and screening for cancer for at least 20 years after your treatment.  If Pap tests have been discontinued, risk factors (such as a new sexual partner) need to be re-assessed to determine if screening should be resumed.  Some women may need screenings more often if they are at high risk for cervical cancer.  Your caregiver may do additional tests including:  Colposcopy. A procedure in which a special microscope magnifies the cells and allows the provider to closely examine the cervix, vagina, and vulva.  Biopsy. A small tissue sample is taken from the cervix, vagina or vulva. This is generally done in your caregivers office.  A cone biopsy (cold knife or laser). A large tissue sample is taken from the cervix. This procedure is usually done in an operating room under a general anesthetic. The cone often removes all abnormal tissue and so may also complete the treatment.  LEEP, also removing a circular portion of the cervix and is done in a doctors office under a local anesthetic.  Now   there is a vaccine, Gardasil, that was developed to prevent the HPV'S that can cause cancer of the cervix and genital warts. It is recommended for females ages 9 to 26. It should not be given to pregnant women until more is known about its effects on the fetus. Not all cancers of the cervix are caused by the HPV. Routine gynecology exams and Pap tests should continue as recommended by your caregiver. Document Released: 04/16/2005 Document Revised: 07/09/2011 Document Reviewed: 04/07/2008 [employer] Patient Information 2013 [employer].  

## 2012-08-11 NOTE — Progress Notes (Signed)
Patient ID: Karen Luna, female   DOB: [birth year], 79 y.o.   MRN: [id number] Referred from Dr Vicente Masson secondary to LSIL with HR HPV on pap. Status post hysterectomy many many years ago, states she had "cancer of her womb" Unsure if cervix but given age I would guess cervical.  She did not receive radiation therapy  See the pap smears as noted in her problem list.  Patient ID: Karen Luna, female   DOB: March 29, 1932, 79 y.o.   MRN: [id number]  Chief Complaint  Patient presents with  . Referral    ABNORMAL PAP    HPI Karen Luna is a 79 y.o. female.   HPI  Indications: Pap smear on March 2014 showed: low-grade squamous intraepithelial neoplasia (LGSIL - encompassing HPV,mild dysplasia,CIN I). Previous colposcopy: in not done. Prior cervical treatment: hysterectomy.  Past Medical History  Diagnosis Date  . Hypertension   . HIV infection   . Anemia of chronic disease   . History of uterine cancer   . Chronic kidney disease, stage 3   . Heart murmur     Aortic area  . Low grade squamous intraepithelial lesion (LGSIL) on cervical Pap smear   . Overweight (BMI 25.0-29.9)   . 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     Past Surgical History  Procedure Laterality Date  . Abdominal hysterectomy      Family History  Problem Relation Age of Onset  . Hypertension Mother   . Heart disease Mother   . Hypertension Father   . Heart disease Father   . Hypertension Sister   . Healthy Brother   . HIV/AIDS Daughter   . Healthy Son   . Healthy Sister   . Healthy Sister   . Healthy Sister   . Healthy Sister   . Early death Sister     Gun shot wound  . Healthy Brother   . Healthy Son   . Cerebrovascular Accident Son     Social History History  Substance Use Topics  . Smoking status: Former Smoker    Types: Cigarettes    Quit date: 08/06/1988  . Smokeless tobacco: Never Used  . Alcohol Use: No    No Known Allergies  Current Outpatient  Prescriptions  Medication Sig Dispense Refill  . amLODipine (NORVASC) 10 MG tablet Take 1 tablet (10 mg total) by mouth daily.  30 tablet  6  . emtricitabine-tenofovir (TRUVADA) 200-300 MG per tablet Take 1 tablet by mouth every Monday, Wednesday, and Friday.  15 tablet  1  . hydrALAZINE (APRESOLINE) 10 MG tablet Take 1 tablet (10 mg total) by mouth 3 (three) times daily.  90 tablet  11  . raltegravir (ISENTRESS) 400 MG tablet Take 1 tablet (400 mg total) by mouth 2 (two) times daily.  60 tablet  1  . cloNIDine (CATAPRES) 0.2 MG tablet       . valsartan-hydrochlorothiazide (DIOVAN-HCT) 320-25 MG per tablet Take 1 tablet by mouth daily.  90 tablet  3   Current Facility-Administered Medications  Medication Dose Route Frequency Provider Last Rate Last Dose  . TDaP (BOOSTRIX) injection 0.5 mL  0.5 mL Intramuscular Once Denna Haggard, MD        Review of Systems Review of Systems  Blood pressure 140/80, height 5\' 7"  (1.702 m), weight 187 lb (84.823 kg).  Physical Exam Physical Exam  Data Reviewed   Assessment    Procedure Details  The risks and benefits of the procedure and Written informed  consent obtained.  Speculum placed in vagina and excellent visualization of vaginal cuff achieved, cervix swabbed x 3 with acetic acid solution.  Specimens: biopsy taken  Complications: none.     Plan    Specimens labelled and sent to Pathology. Return to evaluate in 6 months, no therapy will be needed at this point  HIV status noted but no viral load identified.  Breean Nannini H 08/11/2012, 2:45 PM

## 2012-08-11 NOTE — Assessment & Plan Note (Signed)
Biopsy taken  Recommend follow up evaluation in 6 months with HPV identification and repeat cytology

## 2012-08-12 ENCOUNTER — Encounter: Payer: Self-pay | Admitting: Internal Medicine

## 2012-09-24 ENCOUNTER — Other Ambulatory Visit: Payer: Self-pay | Admitting: *Deleted

## 2012-09-24 DIAGNOSIS — I10 Essential (primary) hypertension: Secondary | ICD-10-CM

## 2012-09-25 MED ORDER — AMLODIPINE BESYLATE 10 MG PO TABS: 10.0000 mg | ORAL_TABLET | Freq: Every day | ORAL | Status: DC

## 2012-10-03 ENCOUNTER — Encounter: Payer: Self-pay | Admitting: Internal Medicine

## 2012-10-03 ENCOUNTER — Ambulatory Visit (INDEPENDENT_AMBULATORY_CARE_PROVIDER_SITE_OTHER): Payer: Medicare Other | Admitting: Internal Medicine

## 2012-10-03 VITALS — BP 156/80 | HR 62 | Temp 97.4°F | Ht 67.0 in | Wt 185.5 lb

## 2012-10-03 DIAGNOSIS — I10 Essential (primary) hypertension: Secondary | ICD-10-CM

## 2012-10-03 DIAGNOSIS — R87612 Low grade squamous intraepithelial lesion on cytologic smear of cervix (LGSIL): Secondary | ICD-10-CM

## 2012-10-03 DIAGNOSIS — I872 Venous insufficiency (chronic) (peripheral): Secondary | ICD-10-CM

## 2012-10-03 DIAGNOSIS — B20 Human immunodeficiency virus [HIV] disease: Secondary | ICD-10-CM

## 2012-10-03 HISTORY — DX: Venous insufficiency (chronic) (peripheral): I87.2

## 2012-10-03 MED ORDER — CLONIDINE HCL 0.2 MG PO TABS: 0.2000 mg | ORAL_TABLET | Freq: Two times a day (BID) | ORAL | Status: DC

## 2012-10-03 NOTE — Progress Notes (Signed)
  Subjective:    Patient ID: Karen Luna, female    DOB: December 05, 1931, 79 y.o.   MRN: [id number]  HPI  Please see the A&P for the status of the pt's chronic medical problems.  Review of Systems  Constitutional: Negative for activity change, appetite change, fatigue and unexpected weight change.  HENT: Negative for ear pain, sore throat, mouth sores and neck pain.   Eyes: Negative for pain.  Respiratory: Negative for chest tightness and shortness of breath.   Cardiovascular: Positive for leg swelling. Negative for chest pain.  Gastrointestinal: Negative for nausea, vomiting, abdominal pain, diarrhea and constipation.  Genitourinary: Negative for flank pain, vaginal pain and pelvic pain.  Musculoskeletal: Negative for myalgias, back pain, joint swelling and arthralgias.  Skin: Negative for color change, rash and wound.  Neurological: Positive for dizziness. Negative for seizures, syncope, weakness and light-headedness.      Objective:   Physical Exam  Nursing note and vitals reviewed. Constitutional: She is oriented to person, place, and time. She appears well-developed and well-nourished. No distress.  HENT:  Head: Normocephalic and atraumatic.  Eyes: Conjunctivae are normal. Right eye exhibits no discharge. Left eye exhibits no discharge. No scleral icterus.  Neck: Neck supple.  Cardiovascular: Normal rate and regular rhythm.  Exam reveals no gallop and no friction rub.   Murmur heard. Pulmonary/Chest: Effort normal and breath sounds normal. No stridor. No respiratory distress. She has no wheezes. She has no rales.  Abdominal: Soft. Bowel sounds are normal. She exhibits no distension. There is no tenderness. There is no rebound and no guarding.  Musculoskeletal: Normal range of motion. She exhibits edema. She exhibits no tenderness.  Neurological: She is alert and oriented to person, place, and time. She exhibits normal muscle tone.  Skin: Skin is warm and dry. No rash noted. She  is not diaphoretic. No erythema.  Psychiatric: She has a normal mood and affect. Her behavior is normal. Judgment and thought content normal.      Assessment & Plan:   Please see Problem Oriented Charting

## 2012-10-03 NOTE — Assessment & Plan Note (Signed)
She states she's been compliant with her amlodipine 10 mg by mouth daily, hydralazine 10 mg by mouth 3 times daily, valsartan hydrochlorothiazide 320-25 mg per mouth daily and clonidine 0.2 mg by mouth twice daily. At the last visit I asked her to stop the clonidine but she failed to do this and continued to take it. Her blood pressure today is improved from the last visit with the systolics in the mid 150s versus 180. She has also lost 2 pounds in the interim. We again discussed the importance of tight blood pressure control given her chronic renal disease and she still is not interested in making any adjustments to her regimen at this time. She does understand the importance of compliance and I believe the improvement in her blood pressure may be a result of improved compliance. We will recheck her blood pressure at the followup visit in 6 months.

## 2012-10-03 NOTE — Assessment & Plan Note (Signed)
Control of her blood pressure is the most important component of managing her chronic kidney disease. She's not interested in making any changes in her regimen but does continue on an angiotensin receptor blocker which should be protective. We will continue her current regimen and consider repeating her basic metabolic panel at the followup visit to see where her estimated GFR is at that time.

## 2012-10-03 NOTE — Assessment & Plan Note (Signed)
She is followed at the [facility] for Infectious Diseases and remains on her Truvada and Isentress. Continued management of her HIV disease will be via the [facility] for [facility] and I'll make no changes to her regimen at this time.

## 2012-10-03 NOTE — Patient Instructions (Signed)
It was great to see you again.  You are doing well except for your blood pressure.  1) Keep taking your medications as prescribed (like you are doing).  2) Please keep thinking about allowing me to increase the dose of your blood pressure medications so that we can lower your blood pressure and protect your kidneys.  3) Follow-up with gynecology as planned.  I will see you back in 6 months for a blood pressure check and blood draw to look at your kidney function.  We would be happy to see you sooner if you need.

## 2012-10-03 NOTE — Assessment & Plan Note (Signed)
She was recently seen by Dr. Despina Hidden who felt no further intervention was necessary at that visit, but that she required close followup with repeat examination in 6 months. I encouraged her to maintain this followup appointment. We will reassess his recommendations when she returns to her primary care followup visit.

## 2012-10-03 NOTE — Assessment & Plan Note (Signed)
She continues to have mild lower extremity swelling throughout the day. This resolves with keeping her legs elevated. She states she's very good about keeping her legs elevated if she is at home. At this time compression stockings do not appear to be necessary. We will followup at the return visit.

## 2012-10-27 ENCOUNTER — Other Ambulatory Visit: Payer: Self-pay | Admitting: *Deleted

## 2012-10-27 DIAGNOSIS — B20 Human immunodeficiency virus [HIV] disease: Secondary | ICD-10-CM

## 2012-10-27 MED ORDER — EMTRICITABINE-TENOFOVIR DF 200-300 MG PO TABS: 1.0000 | ORAL_TABLET | ORAL | Status: DC

## 2012-10-27 MED ORDER — RALTEGRAVIR POTASSIUM 400 MG PO TABS: 400.0000 mg | ORAL_TABLET | Freq: Two times a day (BID) | ORAL | Status: DC

## 2012-11-10 ENCOUNTER — Other Ambulatory Visit: Payer: Self-pay | Admitting: *Deleted

## 2012-11-10 DIAGNOSIS — B20 Human immunodeficiency virus [HIV] disease: Secondary | ICD-10-CM

## 2012-11-10 MED ORDER — EMTRICITABINE-TENOFOVIR DF 200-300 MG PO TABS: 1.0000 | ORAL_TABLET | ORAL | Status: DC

## 2012-11-10 MED ORDER — RALTEGRAVIR POTASSIUM 400 MG PO TABS: 400.0000 mg | ORAL_TABLET | Freq: Two times a day (BID) | ORAL | Status: DC

## 2013-02-24 ENCOUNTER — Other Ambulatory Visit: Payer: Self-pay | Admitting: *Deleted

## 2013-02-24 DIAGNOSIS — B20 Human immunodeficiency virus [HIV] disease: Secondary | ICD-10-CM

## 2013-02-24 MED ORDER — RALTEGRAVIR POTASSIUM 400 MG PO TABS: 400.0000 mg | ORAL_TABLET | Freq: Two times a day (BID) | ORAL | Status: DC

## 2013-02-24 MED ORDER — EMTRICITABINE-TENOFOVIR DF 200-300 MG PO TABS: 1.0000 | ORAL_TABLET | ORAL | Status: DC

## 2013-03-10 ENCOUNTER — Ambulatory Visit (INDEPENDENT_AMBULATORY_CARE_PROVIDER_SITE_OTHER): Payer: Medicare Other | Admitting: Internal Medicine

## 2013-03-10 ENCOUNTER — Other Ambulatory Visit (HOSPITAL_COMMUNITY)
Admission: RE | Admit: 2013-03-10 | Discharge: 2013-03-10 | Disposition: A | Discharge status: Home / Self Care | Payer: Medicare Other | Source: Ambulatory Visit | Attending: Internal Medicine | Admitting: Internal Medicine

## 2013-03-10 ENCOUNTER — Encounter: Payer: Self-pay | Admitting: Internal Medicine

## 2013-03-10 VITALS — BP 156/76 | HR 73 | Temp 98.0°F | Ht 67.0 in | Wt 185.0 lb

## 2013-03-10 DIAGNOSIS — Z113 Encounter for screening for infections with a predominantly sexual mode of transmission: Secondary | ICD-10-CM | POA: Insufficient documentation

## 2013-03-10 DIAGNOSIS — Z23 Encounter for immunization: Secondary | ICD-10-CM

## 2013-03-10 DIAGNOSIS — Z79899 Other long term (current) drug therapy: Secondary | ICD-10-CM

## 2013-03-10 DIAGNOSIS — B20 Human immunodeficiency virus [HIV] disease: Secondary | ICD-10-CM

## 2013-03-10 LAB — COMPLETE METABOLIC PANEL WITH GFR
AST: 17 U/L (ref 0–37)
BUN: 20 mg/dL (ref 6–23)
Calcium: 9.9 mg/dL (ref 8.4–10.5)
Chloride: 106 mEq/L (ref 96–112)
Creat: 1.28 mg/dL — ABNORMAL HIGH (ref 0.50–1.10)
GFR, Est African American: 46 mL/min — ABNORMAL LOW
Total Bilirubin: 0.3 mg/dL (ref 0.3–1.2)

## 2013-03-10 LAB — CBC WITH DIFFERENTIAL/PLATELET
Lymphocytes Relative: 33 % (ref 12–46)
Lymphs Abs: 1.8 10*3/uL (ref 0.7–4.0)
Neutrophils Relative %: 56 % (ref 43–77)
Platelets: 407 10*3/uL — ABNORMAL HIGH (ref 150–400)
RBC: 3.13 MIL/uL — ABNORMAL LOW (ref 3.87–5.11)
WBC: 5.4 10*3/uL (ref 4.0–10.5)

## 2013-03-10 LAB — LIPID PANEL
Cholesterol: 167 mg/dL (ref 0–200)
HDL: 43 mg/dL (ref >39)
Total CHOL/HDL Ratio: 3.9 Ratio
Triglycerides: 211 mg/dL — ABNORMAL HIGH (ref <150)

## 2013-03-10 NOTE — Assessment & Plan Note (Signed)
I will check her labs today including HLA testing for possible abacavir use if needed.

## 2013-03-10 NOTE — Progress Notes (Signed)
  Subjective:    Patient ID: Karen Luna, female    DOB: 29-Oct-1931, 79 y.o.   MRN: [id number]  HPI  She comes in for routine follow up of HIV. She continues to take Isentress twice a day and Truvada every Monday Wednesday and Friday. She does have a GFR below 50. She denies any missed doses and she is aware of what medications she takes for her HIV. She has no complaints and feels well. Her blood pressure is elevated and she continues to work with Dr. Josem Kaufmann. She does take clonidine again.  He has not had recent labs.   Review of Systems  Constitutional: Negative for fever, appetite change and fatigue.  HENT: Negative for sore throat and trouble swallowing.   Gastrointestinal: Negative for nausea, abdominal pain and diarrhea.  Musculoskeletal: Negative for arthralgias and myalgias.  Neurological: Negative for dizziness and headaches.       Objective:   Physical Exam  Constitutional: She appears well-developed and well-nourished. No distress.  HENT:  Mouth/Throat: Oropharynx is clear and moist. No oropharyngeal exudate.  Cardiovascular: Normal rate, regular rhythm and normal heart sounds.  Exam reveals no gallop and no friction rub.   No murmur heard. Pulmonary/Chest: Effort normal and breath sounds normal. No respiratory distress. She has no wheezes. She has no rales.          Assessment & Plan:

## 2013-03-11 LAB — T-HELPER CELL (CD4) - (RCID CLINIC ONLY)
CD4 % Helper T Cell: 23 % — ABNORMAL LOW (ref 33–55)
CD4 T Cell Abs: 390 /uL — ABNORMAL LOW (ref 400–2700)

## 2013-03-11 LAB — HIV-1 RNA QUANT-NO REFLEX-BLD: HIV 1 RNA Quant: <20 copies/mL (ref <20)

## 2013-03-13 ENCOUNTER — Other Ambulatory Visit: Payer: Self-pay | Admitting: *Deleted

## 2013-03-13 DIAGNOSIS — B20 Human immunodeficiency virus [HIV] disease: Secondary | ICD-10-CM

## 2013-03-13 MED ORDER — RALTEGRAVIR POTASSIUM 400 MG PO TABS: 400.0000 mg | ORAL_TABLET | Freq: Two times a day (BID) | ORAL | Status: DC

## 2013-03-13 MED ORDER — EMTRICITABINE-TENOFOVIR DF 200-300 MG PO TABS: 1.0000 | ORAL_TABLET | ORAL | Status: DC

## 2013-07-08 ENCOUNTER — Other Ambulatory Visit: Payer: Medicare Other

## 2013-07-08 DIAGNOSIS — B20 Human immunodeficiency virus [HIV] disease: Secondary | ICD-10-CM

## 2013-07-08 LAB — CBC WITH DIFFERENTIAL/PLATELET
BASOS PCT: 1 % (ref 0–1)
Basophils Absolute: 0.1 10*3/uL (ref 0.0–0.1)
EOS PCT: 2 % (ref 0–5)
Eosinophils Absolute: 0.1 10*3/uL (ref 0.0–0.7)
HCT: 34.7 % — ABNORMAL LOW (ref 36.0–46.0)
HEMOGLOBIN: 11.6 g/dL — AB (ref 12.0–15.0)
LYMPHS ABS: 1.7 10*3/uL (ref 0.7–4.0)
Lymphocytes Relative: 30 % (ref 12–46)
MCH: 31.5 pg (ref 26.0–34.0)
MCHC: 33.4 g/dL (ref 30.0–36.0)
MCV: 94.3 fL (ref 78.0–100.0)
MONO ABS: 0.4 10*3/uL (ref 0.1–1.0)
MONOS PCT: 7 % (ref 3–12)
Neutro Abs: 3.4 10*3/uL (ref 1.7–7.7)
Neutrophils Relative %: 60 % (ref 43–77)
Platelets: 498 10*3/uL — ABNORMAL HIGH (ref 150–400)
RBC: 3.68 MIL/uL — AB (ref 3.87–5.11)
RDW: 15.4 % (ref 11.5–15.5)
WBC: 5.6 10*3/uL (ref 4.0–10.5)

## 2013-07-08 LAB — COMPLETE METABOLIC PANEL WITH GFR
ALK PHOS: 75 U/L (ref 39–117)
ALT: 14 U/L (ref 0–35)
AST: 17 U/L (ref 0–37)
Albumin: 4.4 g/dL (ref 3.5–5.2)
BUN: 23 mg/dL (ref 6–23)
CO2: 26 meq/L (ref 19–32)
CREATININE: 1.25 mg/dL — AB (ref 0.50–1.10)
Calcium: 10.2 mg/dL (ref 8.4–10.5)
Chloride: 107 mEq/L (ref 96–112)
GFR, EST AFRICAN AMERICAN: 47 mL/min — AB
GFR, EST NON AFRICAN AMERICAN: 41 mL/min — AB
GLUCOSE: 98 mg/dL (ref 70–99)
Potassium: 4.9 mEq/L (ref 3.5–5.3)
Sodium: 141 mEq/L (ref 135–145)
TOTAL PROTEIN: 8.2 g/dL (ref 6.0–8.3)
Total Bilirubin: 0.4 mg/dL (ref 0.2–1.2)

## 2013-07-09 LAB — HIV-1 RNA QUANT-NO REFLEX-BLD: HIV-1 RNA Quant, Log: <1.3 {Log} (ref <1.30)

## 2013-07-09 LAB — HEPATITIS C ANTIBODY: HCV Ab: NEGATIVE

## 2013-07-09 LAB — T-HELPER CELL (CD4) - (RCID CLINIC ONLY)
CD4 T CELL HELPER: 20 % — AB (ref 33–55)
CD4 T Cell Abs: 380 /uL — ABNORMAL LOW (ref 400–2700)

## 2013-07-10 ENCOUNTER — Other Ambulatory Visit: Payer: Self-pay | Admitting: *Deleted

## 2013-07-10 DIAGNOSIS — I10 Essential (primary) hypertension: Secondary | ICD-10-CM

## 2013-07-12 MED ORDER — CLONIDINE HCL 0.2 MG PO TABS
0.2000 mg | ORAL_TABLET | Freq: Two times a day (BID) | ORAL | Status: DC
Start: ? — End: 2014-07-28

## 2013-07-13 ENCOUNTER — Other Ambulatory Visit: Payer: Self-pay | Admitting: *Deleted

## 2013-07-13 DIAGNOSIS — I10 Essential (primary) hypertension: Secondary | ICD-10-CM

## 2013-07-13 MED ORDER — HYDRALAZINE HCL 10 MG PO TABS: 10.0000 mg | ORAL_TABLET | Freq: Three times a day (TID) | ORAL | Status: DC

## 2013-07-23 ENCOUNTER — Encounter: Payer: Self-pay | Admitting: Internal Medicine

## 2013-07-23 ENCOUNTER — Ambulatory Visit (INDEPENDENT_AMBULATORY_CARE_PROVIDER_SITE_OTHER): Payer: Medicare Other | Admitting: Internal Medicine

## 2013-07-23 VITALS — BP 129/64 | HR 50 | Temp 98.2°F | Ht 67.0 in | Wt 187.0 lb

## 2013-07-23 DIAGNOSIS — R1031 Right lower quadrant pain: Secondary | ICD-10-CM

## 2013-07-23 DIAGNOSIS — Z79899 Other long term (current) drug therapy: Secondary | ICD-10-CM

## 2013-07-23 DIAGNOSIS — I10 Essential (primary) hypertension: Secondary | ICD-10-CM

## 2013-07-23 DIAGNOSIS — Z113 Encounter for screening for infections with a predominantly sexual mode of transmission: Secondary | ICD-10-CM

## 2013-07-23 DIAGNOSIS — B20 Human immunodeficiency virus [HIV] disease: Secondary | ICD-10-CM

## 2013-07-23 DIAGNOSIS — G8929 Other chronic pain: Secondary | ICD-10-CM

## 2013-07-23 DIAGNOSIS — N183 Chronic kidney disease, stage 3 unspecified: Secondary | ICD-10-CM

## 2013-07-23 NOTE — Assessment & Plan Note (Signed)
Stable, PCP appt May 1st

## 2013-07-23 NOTE — Progress Notes (Signed)
  Subjective:    Patient ID: Karen Luna, female    DOB: 10/05/1931, 80 y.o.   MRN: [id number]  HPI  She comes in for routine follow up of HIV. She continues to take Isentress twice a day and Truvada every Monday Wednesday and Friday. She does have a GFR below 50. She denies any missed doses and she is aware of what medications she takes for her HIV. She has no complaints and feels well. Her blood pressure is elevated and she continues to work with Dr. Eppie Gibson. She does take clonidine again.  Labs with undetectable vl and good CD4 count.  Some right abdominal pain that is sharp and intermittent.  Says she went to a clinic and told it was a urine infeciton.    Review of Systems  Constitutional: Negative for fever, appetite change and fatigue.  HENT: Negative for sore throat and trouble swallowing.   Gastrointestinal: Negative for nausea, abdominal pain and diarrhea.  Musculoskeletal: Negative for arthralgias and myalgias.  Neurological: Negative for dizziness and headaches.       Objective:   Physical Exam  Constitutional: She appears well-developed and well-nourished. No distress.  HENT:  Mouth/Throat: Oropharynx is clear and moist. No oropharyngeal exudate.  Cardiovascular: Normal rate, regular rhythm and normal heart sounds.  Exam reveals no gallop and no friction rub.   No murmur heard. Pulmonary/Chest: Effort normal and breath sounds normal. No respiratory distress. She has no wheezes. She has no rales.          Assessment & Plan:

## 2013-07-23 NOTE — Assessment & Plan Note (Signed)
Stable

## 2013-07-23 NOTE — Assessment & Plan Note (Addendum)
Doing well on current regimen.  RTC 6 months.  She seems more forgetful now, does not remember me from previous which is new.  I wonder if she is developing dementia?  Has PCP appt coming up.

## 2013-07-23 NOTE — Addendum Note (Signed)
Addended by: Dolan Amen D on: 07/23/2013 02:36 PM   Modules accepted: Orders

## 2013-07-23 NOTE — Assessment & Plan Note (Signed)
Intermittent pain, no concerning signs on exam for surgical abdomen.  Will check ua.

## 2013-07-24 LAB — URINALYSIS, ROUTINE W REFLEX MICROSCOPIC
Bilirubin Urine: NEGATIVE
Glucose, UA: NEGATIVE mg/dL
HGB URINE DIPSTICK: NEGATIVE
Ketones, ur: NEGATIVE mg/dL
NITRITE: NEGATIVE
PH: 5.5 (ref 5.0–8.0)
Protein, ur: NEGATIVE mg/dL
Specific Gravity, Urine: 1.025 (ref 1.005–1.030)
Urobilinogen, UA: 0.2 mg/dL (ref 0.0–1.0)

## 2013-07-24 LAB — URINALYSIS, MICROSCOPIC ONLY
CASTS: NONE SEEN
Crystals: NONE SEEN

## 2013-08-01 ENCOUNTER — Encounter: Payer: Self-pay | Admitting: *Deleted

## 2013-08-28 ENCOUNTER — Encounter: Payer: Self-pay | Admitting: Internal Medicine

## 2013-08-28 ENCOUNTER — Ambulatory Visit (INDEPENDENT_AMBULATORY_CARE_PROVIDER_SITE_OTHER): Payer: Medicare HMO | Admitting: Internal Medicine

## 2013-08-28 VITALS — BP 101/49 | HR 45 | Temp 97.6°F | Wt 182.1 lb

## 2013-08-28 DIAGNOSIS — R87612 Low grade squamous intraepithelial lesion on cytologic smear of cervix (LGSIL): Secondary | ICD-10-CM

## 2013-08-28 DIAGNOSIS — I129 Hypertensive chronic kidney disease with stage 1 through stage 4 chronic kidney disease, or unspecified chronic kidney disease: Secondary | ICD-10-CM | POA: Diagnosis not present

## 2013-08-28 DIAGNOSIS — B20 Human immunodeficiency virus [HIV] disease: Secondary | ICD-10-CM

## 2013-08-28 DIAGNOSIS — I10 Essential (primary) hypertension: Secondary | ICD-10-CM

## 2013-08-28 DIAGNOSIS — R1031 Right lower quadrant pain: Secondary | ICD-10-CM

## 2013-08-28 DIAGNOSIS — G8929 Other chronic pain: Secondary | ICD-10-CM

## 2013-08-28 DIAGNOSIS — N183 Chronic kidney disease, stage 3 unspecified: Secondary | ICD-10-CM | POA: Diagnosis not present

## 2013-08-28 DIAGNOSIS — E663 Overweight: Secondary | ICD-10-CM

## 2013-08-28 NOTE — Assessment & Plan Note (Signed)
She continues to note occasional sharp pain in the right lower quadrant of the abdomen. Over the past week she senses a fullness in this area. On my exam I did not feel a discrete mass but did feel that sense of fullness that she had noted. Given the chronicity of the pain and the physical exam we decided to obtain an ultrasound of the right lower quadrant to look for any structural abnormalities that could explain her pain. Further evaluation or intervention are pending the results of this examination.

## 2013-08-28 NOTE — Assessment & Plan Note (Signed)
At the last GYN visit it was recommended that she followup in 6 months. She was never contacted by the clinic. We will therefore try to get her set up in the GYN clinic for followup as per their recommendations.

## 2013-08-28 NOTE — Patient Instructions (Signed)
It was good to see you again.  You are taking great care of yourself.  I am impressed with the 5 pound weight loss in the last 2 months.  Keep up the good work!  1) Keep taking all of the medications as you are.  2) I ordered an ultrasound of your belly to try to figure out what is causing the pain and the "knot".  3) Call me if you start getting dizzy as I will decrease your blood pressure medications.  4) I made a referral back to the [facility] doctor for a check up they recommended last year.  I will see you in 1 year, sooner if necessary.

## 2013-08-28 NOTE — Assessment & Plan Note (Signed)
Her blood pressure is very well controlled today at 101/49. This is on amlodipine 10 mg by mouth daily, clonidine 0.2 mg by mouth twice daily, hydralazine 10 mg by mouth 3 times daily, and valsartan-hydrochlorothiazide 320-25 mg by mouth daily. Given her excellent blood pressure control and the fact that she is tolerating this regimen well we will continue at the current doses. She was asked to give me a call should she develop lightheadedness at which point I would start de-escalating her pharmacologic regimen.

## 2013-08-28 NOTE — Assessment & Plan Note (Signed)
Her weight is down 3 pounds in the last 6 months. She was congratulated on this accomplishment. I tried to connect the improved blood pressure control with the weight loss. She was encouraged to continue with her current diet and activity level in hopes of further reduction in her weight.

## 2013-08-28 NOTE — Assessment & Plan Note (Signed)
She is followed closely by Dr. Linus Salmons in the [facility] for Infectious Diseases. At his last check up her HIV was well controlled. She is tolerating the Truvada and Isentress well. I will defer management of her HIV to Dr. Linus Salmons.

## 2013-08-28 NOTE — Assessment & Plan Note (Signed)
One of the keys to preserving her renal function is to control her blood pressure. This has been accomplished on the current regimen which includes an angiotensin receptor blocker. We will continue this regimen.

## 2013-08-28 NOTE — Progress Notes (Signed)
   Subjective:    Patient ID: Karen Luna, female    DOB: 1931/12/10, 80 y.o.   MRN: [id number]  HPI  Please see the A&P for the status of the pt's chronic medical problems.  Review of Systems  Constitutional: Negative for activity change, appetite change, fatigue and unexpected weight change.  HENT: Positive for congestion.        Developed a cold with clear mucous from nose X 5 days  Respiratory: Negative for shortness of breath and wheezing.   Cardiovascular: Negative for chest pain, palpitations and leg swelling.  Gastrointestinal: Negative for nausea, vomiting, abdominal pain, diarrhea and constipation.  Genitourinary: Negative for dysuria.  Musculoskeletal: Negative for arthralgias, myalgias and neck pain.  Skin: Negative for color change, pallor, rash and wound.  Neurological: Negative for dizziness, seizures, syncope, weakness and light-headedness.      Objective:   Physical Exam  Nursing note and vitals reviewed. Constitutional: She is oriented to person, place, and time. She appears well-developed and well-nourished. No distress.  HENT:  Head: Normocephalic and atraumatic.  Eyes: Conjunctivae are normal. Right eye exhibits no discharge. Left eye exhibits no discharge. No scleral icterus.  Cardiovascular: Normal rate, regular rhythm and normal heart sounds.  Exam reveals no gallop and no friction rub.   No murmur heard. Pulmonary/Chest: Effort normal and breath sounds normal. No respiratory distress. She has no wheezes. She has no rales.  Abdominal: Soft. Bowel sounds are normal. She exhibits no distension. There is no tenderness. There is no rebound and no guarding.  Musculoskeletal: Normal range of motion. She exhibits no edema and no tenderness.  Neurological: She is alert and oriented to person, place, and time. She exhibits normal muscle tone.  Skin: Skin is warm and dry. No rash noted. She is not diaphoretic. No erythema. No pallor.  Psychiatric: She has a normal  mood and affect. Her behavior is normal. Judgment and thought content normal.      Assessment & Plan:   Please see problem oriented charting.

## 2013-09-18 ENCOUNTER — Other Ambulatory Visit: Payer: Self-pay | Admitting: Internal Medicine

## 2013-09-24 ENCOUNTER — Other Ambulatory Visit: Payer: Self-pay | Admitting: *Deleted

## 2013-09-24 DIAGNOSIS — I10 Essential (primary) hypertension: Secondary | ICD-10-CM

## 2013-09-25 MED ORDER — VALSARTAN-HYDROCHLOROTHIAZIDE 320-25 MG PO TABS
1.0000 | ORAL_TABLET | Freq: Every day | ORAL | Status: DC
Start: ? — End: 2014-09-25

## 2013-10-19 ENCOUNTER — Other Ambulatory Visit: Payer: Self-pay | Admitting: Internal Medicine

## 2013-10-19 DIAGNOSIS — I10 Essential (primary) hypertension: Secondary | ICD-10-CM

## 2013-11-09 ENCOUNTER — Other Ambulatory Visit: Payer: Self-pay | Admitting: Internal Medicine

## 2014-01-08 ENCOUNTER — Other Ambulatory Visit: Payer: Self-pay | Admitting: Internal Medicine

## 2014-01-12 ENCOUNTER — Other Ambulatory Visit: Payer: Medicare HMO

## 2014-01-12 DIAGNOSIS — B20 Human immunodeficiency virus [HIV] disease: Secondary | ICD-10-CM

## 2014-01-12 DIAGNOSIS — Z113 Encounter for screening for infections with a predominantly sexual mode of transmission: Secondary | ICD-10-CM

## 2014-01-12 DIAGNOSIS — Z79899 Other long term (current) drug therapy: Secondary | ICD-10-CM

## 2014-01-12 LAB — COMPLETE METABOLIC PANEL WITH GFR
ALBUMIN: 4.5 g/dL (ref 3.5–5.2)
ALT: 12 U/L (ref 0–35)
AST: 21 U/L (ref 0–37)
Alkaline Phosphatase: 78 U/L (ref 39–117)
BILIRUBIN TOTAL: 0.5 mg/dL (ref 0.2–1.2)
BUN: 20 mg/dL (ref 6–23)
CO2: 20 mEq/L (ref 19–32)
Calcium: 9.9 mg/dL (ref 8.4–10.5)
Chloride: 105 mEq/L (ref 96–112)
Creat: 1.16 mg/dL — ABNORMAL HIGH (ref 0.50–1.10)
GFR, EST NON AFRICAN AMERICAN: 45 mL/min — AB
GFR, Est African American: 51 mL/min — ABNORMAL LOW
GLUCOSE: 85 mg/dL (ref 70–99)
POTASSIUM: 4 meq/L (ref 3.5–5.3)
SODIUM: 138 meq/L (ref 135–145)
TOTAL PROTEIN: 8.3 g/dL (ref 6.0–8.3)

## 2014-01-12 LAB — LIPID PANEL
Cholesterol: 187 mg/dL (ref 0–200)
HDL: 48 mg/dL (ref >39)
LDL CALC: 104 mg/dL — AB (ref 0–99)
Total CHOL/HDL Ratio: 3.9 Ratio
Triglycerides: 175 mg/dL — ABNORMAL HIGH (ref <150)
VLDL: 35 mg/dL (ref 0–40)

## 2014-01-12 LAB — CBC WITH DIFFERENTIAL/PLATELET
Basophils Absolute: 0 10*3/uL (ref 0.0–0.1)
Basophils Relative: 0 % (ref 0–1)
EOS ABS: 0.1 10*3/uL (ref 0.0–0.7)
Eosinophils Relative: 1 % (ref 0–5)
HCT: 32.4 % — ABNORMAL LOW (ref 36.0–46.0)
Hemoglobin: 11.1 g/dL — ABNORMAL LOW (ref 12.0–15.0)
LYMPHS PCT: 31 % (ref 12–46)
Lymphs Abs: 1.9 10*3/uL (ref 0.7–4.0)
MCH: 32.2 pg (ref 26.0–34.0)
MCHC: 34.3 g/dL (ref 30.0–36.0)
MCV: 93.9 fL (ref 78.0–100.0)
Monocytes Absolute: 0.5 10*3/uL (ref 0.1–1.0)
Monocytes Relative: 8 % (ref 3–12)
Neutro Abs: 3.7 10*3/uL (ref 1.7–7.7)
Neutrophils Relative %: 60 % (ref 43–77)
PLATELETS: 414 10*3/uL — AB (ref 150–400)
RBC: 3.45 MIL/uL — AB (ref 3.87–5.11)
RDW: 16.1 % — ABNORMAL HIGH (ref 11.5–15.5)
WBC: 6.2 10*3/uL (ref 4.0–10.5)

## 2014-01-13 LAB — HIV-1 RNA QUANT-NO REFLEX-BLD: HIV-1 RNA Quant, Log: <1.3 {Log} (ref <1.30)

## 2014-01-13 LAB — T-HELPER CELL (CD4) - (RCID CLINIC ONLY)
CD4 T CELL ABS: 460 /uL (ref 400–2700)
CD4 T CELL HELPER: 22 % — AB (ref 33–55)

## 2014-01-13 LAB — RPR

## 2014-01-26 ENCOUNTER — Encounter: Payer: Self-pay | Admitting: Internal Medicine

## 2014-01-26 ENCOUNTER — Ambulatory Visit (INDEPENDENT_AMBULATORY_CARE_PROVIDER_SITE_OTHER): Payer: Medicare HMO | Admitting: Internal Medicine

## 2014-01-26 VITALS — BP 136/78 | HR 51 | Temp 98.0°F | Wt 171.0 lb

## 2014-01-26 DIAGNOSIS — B20 Human immunodeficiency virus [HIV] disease: Secondary | ICD-10-CM

## 2014-01-26 DIAGNOSIS — Z23 Encounter for immunization: Secondary | ICD-10-CM

## 2014-01-26 NOTE — Progress Notes (Signed)
  Subjective:    Patient ID: Karen Luna, female    DOB: June 01, 1931, 80 y.o.   MRN: [id number]  HPI She comes in for routine follow up of HIV. She continues to take Isentress twice a day and Truvada every Monday Wednesday and Friday. She does have a GFR around 50. She denies any missed doses and she is aware of what medications she takes for her HIV though it appears she has not filled for 2 months. Despite this she is undetectable.  It is unclear but she recognizes her medications.     Review of Systems  Constitutional: Negative for fever, appetite change and fatigue.  HENT: Negative for sore throat and trouble swallowing.   Gastrointestinal: Negative for nausea, abdominal pain and diarrhea.  Musculoskeletal: Negative for arthralgias and myalgias.  Neurological: Negative for dizziness and headaches.       Objective:   Physical Exam  Constitutional: She appears well-developed and well-nourished. No distress.  HENT:  Mouth/Throat: Oropharynx is clear and moist. No oropharyngeal exudate.  Cardiovascular: Normal rate, regular rhythm and normal heart sounds.  Exam reveals no gallop and no friction rub.   No murmur heard. Pulmonary/Chest: Effort normal and breath sounds normal. No respiratory distress. She has no wheezes. She has no rales.          Assessment & Plan:

## 2014-01-26 NOTE — Assessment & Plan Note (Signed)
She is doing well according to her labs but we talked to her son to be sure.  She is going to the pharmacy now and reiterated the medications and frequency.  I will consider e/c/taf next visit for ease of dosing and renal tolerance.

## 2014-02-18 ENCOUNTER — Other Ambulatory Visit: Payer: Self-pay | Admitting: Internal Medicine

## 2014-02-18 DIAGNOSIS — B20 Human immunodeficiency virus [HIV] disease: Secondary | ICD-10-CM

## 2014-02-18 MED ORDER — EMTRICITABINE-TENOFOVIR DF 200-300 MG PO TABS: ORAL_TABLET | ORAL | Status: DC

## 2014-05-20 ENCOUNTER — Other Ambulatory Visit: Payer: Medicare HMO

## 2014-05-20 DIAGNOSIS — B20 Human immunodeficiency virus [HIV] disease: Secondary | ICD-10-CM

## 2014-05-20 LAB — CBC WITH DIFFERENTIAL/PLATELET
BASOS PCT: 0 % (ref 0–1)
Basophils Absolute: 0 10*3/uL (ref 0.0–0.1)
EOS PCT: 1 % (ref 0–5)
Eosinophils Absolute: 0.1 10*3/uL (ref 0.0–0.7)
HCT: 31.8 % — ABNORMAL LOW (ref 36.0–46.0)
Hemoglobin: 10.8 g/dL — ABNORMAL LOW (ref 12.0–15.0)
Lymphocytes Relative: 25 % (ref 12–46)
Lymphs Abs: 1.9 10*3/uL (ref 0.7–4.0)
MCH: 32 pg (ref 26.0–34.0)
MCHC: 34 g/dL (ref 30.0–36.0)
MCV: 94.4 fL (ref 78.0–100.0)
MPV: 8.8 fL (ref 8.6–12.4)
Monocytes Absolute: 0.7 10*3/uL (ref 0.1–1.0)
Monocytes Relative: 9 % (ref 3–12)
NEUTROS ABS: 4.8 10*3/uL (ref 1.7–7.7)
Neutrophils Relative %: 65 % (ref 43–77)
Platelets: 540 10*3/uL — ABNORMAL HIGH (ref 150–400)
RBC: 3.37 MIL/uL — AB (ref 3.87–5.11)
RDW: 14.4 % (ref 11.5–15.5)
WBC: 7.4 10*3/uL (ref 4.0–10.5)

## 2014-05-20 LAB — COMPLETE METABOLIC PANEL WITH GFR
ALBUMIN: 4.5 g/dL (ref 3.5–5.2)
ALT: 11 U/L (ref 0–35)
AST: 15 U/L (ref 0–37)
Alkaline Phosphatase: 88 U/L (ref 39–117)
BUN: 17 mg/dL (ref 6–23)
CHLORIDE: 105 meq/L (ref 96–112)
CO2: 23 mEq/L (ref 19–32)
Calcium: 10.3 mg/dL (ref 8.4–10.5)
Creat: 1.22 mg/dL — ABNORMAL HIGH (ref 0.50–1.10)
GFR, EST NON AFRICAN AMERICAN: 42 mL/min — AB
GFR, Est African American: 48 mL/min — ABNORMAL LOW
GLUCOSE: 95 mg/dL (ref 70–99)
Potassium: 3.9 mEq/L (ref 3.5–5.3)
SODIUM: 138 meq/L (ref 135–145)
Total Bilirubin: 0.3 mg/dL (ref 0.2–1.2)
Total Protein: 8.5 g/dL — ABNORMAL HIGH (ref 6.0–8.3)

## 2014-05-21 LAB — T-HELPER CELL (CD4) - (RCID CLINIC ONLY)
CD4 T CELL ABS: 390 /uL — AB (ref 400–2700)
CD4 T CELL HELPER: 22 % — AB (ref 33–55)

## 2014-05-24 LAB — HIV-1 RNA QUANT-NO REFLEX-BLD: HIV-1 RNA Quant, Log: <1.3 {Log} (ref <1.30)

## 2014-06-03 ENCOUNTER — Ambulatory Visit (INDEPENDENT_AMBULATORY_CARE_PROVIDER_SITE_OTHER): Payer: Medicare HMO | Admitting: Internal Medicine

## 2014-06-03 ENCOUNTER — Encounter: Payer: Self-pay | Admitting: Internal Medicine

## 2014-06-03 VITALS — BP 146/74 | HR 45 | Temp 97.8°F | Ht 67.0 in | Wt 167.0 lb

## 2014-06-03 DIAGNOSIS — B20 Human immunodeficiency virus [HIV] disease: Secondary | ICD-10-CM | POA: Diagnosis not present

## 2014-06-03 MED ORDER — ELVITEG-COBIC-EMTRICIT-TENOFAF 150-150-200-10 MG PO TABS: 1.0000 | ORAL_TABLET | Freq: Every day | ORAL | Status: DC

## 2014-06-03 MED ORDER — ABACAVIR-DOLUTEGRAVIR-LAMIVUD 600-50-300 MG PO TABS: 1.0000 | ORAL_TABLET | Freq: Every day | ORAL | Status: DC

## 2014-06-03 NOTE — Assessment & Plan Note (Addendum)
She has done well with her medication. I am going to streamline her regimen to Triumeq which is good at a GFR over 30 without any dose adjustment. This is going to replace Isentress and 3 times a week Truvada.  I will see her back in 3 months to make sure she is doing well. She was pleased with this change.

## 2014-06-03 NOTE — Progress Notes (Signed)
  Subjective:    Patient ID: Karen Luna, female    DOB: [birth year], 81 y.o.   MRN: [id number]  HPI She comes in for routine follow up of HIV. She continues to take Isentress twice a day and Truvada every Monday Wednesday and Friday. She does have a GFR around 50. She denies any missed doses and she is aware of what medications she takes for her HIV though it appears she has not filled for 2 months. Despite this she is undetectable.  Her CD4 is 390.  Her creat is 1.22 and GFR in the 40s.    Review of Systems  Constitutional: Negative for fever, appetite change and fatigue.  HENT: Negative for sore throat and trouble swallowing.   Gastrointestinal: Negative for nausea, abdominal pain and diarrhea.  Musculoskeletal: Negative for myalgias and arthralgias.  Neurological: Negative for dizziness and headaches.       Objective:   Physical Exam  Constitutional: She appears well-developed and well-nourished. No distress.  HENT:  Mouth/Throat: Oropharynx is clear and moist. No oropharyngeal exudate.  Cardiovascular: Normal rate, regular rhythm and normal heart sounds.  Exam reveals no gallop and no friction rub.   No murmur heard. Pulmonary/Chest: Effort normal and breath sounds normal. No respiratory distress. She has no wheezes. She has no rales.          Assessment & Plan:

## 2014-06-03 NOTE — Progress Notes (Signed)
Patient ID: Karen Luna, female   DOB: December 29, 1931, 81 y.o.   MRN: [id number] HPI: Karen Luna is a 81 y.o. female who is here for f/u of her HIV  Allergies: No Known Allergies  Vitals: Temp: 97.8 F (36.6 C) (02/04 1342) Temp Source: Oral (02/04 1342) BP: 146/74 mmHg (02/04 1342) Pulse Rate: 45 (02/04 1342)  Past Medical History: Past Medical History  Diagnosis Date  . Human immunodeficiency virus (HIV) disease 09/12/2006  . Essential hypertension 09/19/2006  . Chronic kidney disease, stage 3 09/01/2008  . Low grade squamous intraepithelial lesion (LGSIL) on cervical Pap smear 08/06/2007    LGSIL: 08/06/2007, 11/05/2008, 05/11/2009 ASC-US: 05/27/2008, 08/31/2009, 05/24/2010, 05/23/2012 High Risk HPV: Detected   . Anemia of chronic disease 07/06/2011  . 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 07/25/2012  . Overweight (BMI 25.0-29.9) 07/25/2012  . History of cervical cancer 08/11/2012    s/p hysterectomy, remote, specifics unknown   . Heart murmur     Aortic area  . Chronic venous insufficiency 10/03/2012    Social History: History   Social History  . Marital Status: Widowed    Spouse Name: N/A    Number of Children: N/A  . Years of Education: N/A   Social History Main Topics  . Smoking status: Former Smoker    Types: Cigarettes    Quit date: 08/06/1988  . Smokeless tobacco: Never Used  . Alcohol Use: No  . Drug Use: No  . Sexual Activity:    Partners: Male   Other Topics Concern  . None   Social History Narrative    Previous Regimen:   Current Regimen: RAL + TRV  Labs: HIV 1 RNA QUANT (copies/mL)  Date Value  05/20/2014 <20  01/12/2014 <20  07/08/2013 <20   CD4 T CELL ABS (/uL)  Date Value  05/20/2014 390*  01/12/2014 460  07/08/2013 380*   HEP B S AB (no units)  Date Value  09/12/2006 Negative   HEPATITIS B SURFACE AG (no units)  Date Value  09/12/2006 Negative   HCV AB (no units)  Date Value  07/08/2013 NEGATIVE     CrCl: Estimated Creatinine Clearance: 39.1 mL/min (by C-G formula based on Cr of 1.22).  Lipids:    Component Value Date/Time   CHOL 187 01/12/2014 0937   TRIG 175* 01/12/2014 0937   HDL 48 01/12/2014 0937   CHOLHDL 3.9 01/12/2014 0937   VLDL 35 01/12/2014 0937   LDLCALC 104* 01/12/2014 0937    Assessment: 81 yo who is well controlled on her current regimen. We are trying to simplify it for her to a once daily regimen. Her CrCl ~ 30-40 due to age and some slight increase in her baseline scr. We were going to change her to Lifescape but the FTC component would still need to be q48 so we are going to use Triumeq instead. She is HLA neg.   Recommendations: Dc RAL + TRV Start Triumeq 1 PO qday  Wilfred Lacy, PharmD Clinical [facility] [place] [facility] 06/03/2014, 2:02 PM

## 2014-06-07 ENCOUNTER — Telehealth: Payer: Self-pay | Admitting: *Deleted

## 2014-06-07 NOTE — Telephone Encounter (Signed)
Triumeq has ben approved through 09/03/2014.

## 2014-07-28 ENCOUNTER — Other Ambulatory Visit: Payer: Self-pay | Admitting: Internal Medicine

## 2014-07-28 DIAGNOSIS — I10 Essential (primary) hypertension: Secondary | ICD-10-CM

## 2014-09-02 ENCOUNTER — Other Ambulatory Visit: Payer: Commercial Managed Care - HMO

## 2014-09-02 DIAGNOSIS — B20 Human immunodeficiency virus [HIV] disease: Secondary | ICD-10-CM | POA: Diagnosis not present

## 2014-09-02 LAB — COMPLETE METABOLIC PANEL WITH GFR
ALBUMIN: 4 g/dL (ref 3.5–5.2)
ALT: 11 U/L (ref 0–35)
AST: 17 U/L (ref 0–37)
Alkaline Phosphatase: 80 U/L (ref 39–117)
BUN: 23 mg/dL (ref 6–23)
CO2: 20 mEq/L (ref 19–32)
Calcium: 10.1 mg/dL (ref 8.4–10.5)
Chloride: 106 mEq/L (ref 96–112)
Creat: 1.39 mg/dL — ABNORMAL HIGH (ref 0.50–1.10)
GFR, EST AFRICAN AMERICAN: 41 mL/min — AB
GFR, Est Non African American: 36 mL/min — ABNORMAL LOW
Glucose, Bld: 102 mg/dL — ABNORMAL HIGH (ref 70–99)
POTASSIUM: 4.2 meq/L (ref 3.5–5.3)
Sodium: 139 mEq/L (ref 135–145)
Total Bilirubin: 0.3 mg/dL (ref 0.2–1.2)
Total Protein: 8.3 g/dL (ref 6.0–8.3)

## 2014-09-03 LAB — HIV-1 RNA QUANT-NO REFLEX-BLD
HIV 1 RNA Quant: <20 copies/mL (ref <20)
HIV-1 RNA Quant, Log: <1.3 {Log} (ref <1.30)

## 2014-09-03 LAB — T-HELPER CELL (CD4) - (RCID CLINIC ONLY)
CD4 T CELL HELPER: 18 % — AB (ref 33–55)
CD4 T Cell Abs: 380 /uL — ABNORMAL LOW (ref 400–2700)

## 2014-09-16 ENCOUNTER — Ambulatory Visit (INDEPENDENT_AMBULATORY_CARE_PROVIDER_SITE_OTHER): Payer: Commercial Managed Care - HMO | Admitting: Internal Medicine

## 2014-09-16 VITALS — BP 131/69 | HR 36 | Temp 97.4°F | Wt 168.5 lb

## 2014-09-16 DIAGNOSIS — B20 Human immunodeficiency virus [HIV] disease: Secondary | ICD-10-CM | POA: Diagnosis not present

## 2014-09-16 DIAGNOSIS — Z79899 Other long term (current) drug therapy: Secondary | ICD-10-CM | POA: Insufficient documentation

## 2014-09-16 DIAGNOSIS — Z113 Encounter for screening for infections with a predominantly sexual mode of transmission: Secondary | ICD-10-CM | POA: Diagnosis not present

## 2014-09-16 NOTE — Assessment & Plan Note (Addendum)
She is doing well with this regimen and will continue with this and she can follow-up in about 6 months. She will see our financial counselor as well.

## 2014-09-16 NOTE — Progress Notes (Signed)
  Subjective:    Patient ID: Karen Luna, female    DOB: [birth year], 81 y.o.   MRN: [id number]  HPI She comes in for routine follow up of HIV. At her last visit, I discussed with her streamlining her regimen to Triumeq. She previously was on Isentress and Truvada. She is pleased with the regimen but is angry because of the new $3 co-pay. So has had some other financial charges from [facility]. She though takes her medication daily and has no missed doses. She wants to continue with this same regimen.   Review of Systems  Constitutional: Negative for fever, appetite change and fatigue.  HENT: Negative for sore throat and trouble swallowing.   Gastrointestinal: Negative for nausea, abdominal pain and diarrhea.  Musculoskeletal: Negative for myalgias and arthralgias.  Neurological: Negative for dizziness and headaches.       Objective:   Physical Exam  Constitutional: She appears well-developed and well-nourished. No distress.  HENT:  Mouth/Throat: Oropharynx is clear and moist. No oropharyngeal exudate.  Cardiovascular: Normal rate, regular rhythm and normal heart sounds.  Exam reveals no gallop and no friction rub.   No murmur heard. Pulmonary/Chest: Effort normal and breath sounds normal. No respiratory distress. She has no wheezes. She has no rales.          Assessment & Plan:

## 2014-09-25 ENCOUNTER — Other Ambulatory Visit: Payer: Self-pay | Admitting: Internal Medicine

## 2014-09-25 DIAGNOSIS — I10 Essential (primary) hypertension: Secondary | ICD-10-CM

## 2014-10-04 ENCOUNTER — Encounter: Payer: Self-pay | Admitting: Internal Medicine

## 2014-11-24 ENCOUNTER — Other Ambulatory Visit: Payer: Self-pay | Admitting: Licensed Clinical Social Worker

## 2014-11-24 MED ORDER — ABACAVIR-DOLUTEGRAVIR-LAMIVUD 600-50-300 MG PO TABS: 1.0000 | ORAL_TABLET | Freq: Every day | ORAL | Status: DC

## 2014-11-26 ENCOUNTER — Ambulatory Visit: Payer: Self-pay | Admitting: Internal Medicine

## 2014-12-17 ENCOUNTER — Ambulatory Visit (INDEPENDENT_AMBULATORY_CARE_PROVIDER_SITE_OTHER): Payer: Commercial Managed Care - HMO | Admitting: Internal Medicine

## 2014-12-17 ENCOUNTER — Encounter: Payer: Self-pay | Admitting: Internal Medicine

## 2014-12-17 VITALS — BP 220/82 | HR 66 | Temp 98.3°F | Wt 173.5 lb

## 2014-12-17 DIAGNOSIS — Z23 Encounter for immunization: Secondary | ICD-10-CM

## 2014-12-17 DIAGNOSIS — N183 Chronic kidney disease, stage 3 unspecified: Secondary | ICD-10-CM

## 2014-12-17 DIAGNOSIS — I129 Hypertensive chronic kidney disease with stage 1 through stage 4 chronic kidney disease, or unspecified chronic kidney disease: Secondary | ICD-10-CM

## 2014-12-17 DIAGNOSIS — G8929 Other chronic pain: Secondary | ICD-10-CM

## 2014-12-17 DIAGNOSIS — R87612 Low grade squamous intraepithelial lesion on cytologic smear of cervix (LGSIL): Secondary | ICD-10-CM

## 2014-12-17 DIAGNOSIS — Z21 Asymptomatic human immunodeficiency virus [HIV] infection status: Secondary | ICD-10-CM | POA: Diagnosis not present

## 2014-12-17 DIAGNOSIS — B20 Human immunodeficiency virus [HIV] disease: Secondary | ICD-10-CM

## 2014-12-17 DIAGNOSIS — Z0000 Encounter for general adult medical examination without abnormal findings: Secondary | ICD-10-CM

## 2014-12-17 DIAGNOSIS — R1031 Right lower quadrant pain: Secondary | ICD-10-CM

## 2014-12-17 DIAGNOSIS — I10 Essential (primary) hypertension: Secondary | ICD-10-CM

## 2014-12-17 MED ORDER — HYDRALAZINE HCL 10 MG PO TABS: 30.0000 mg | ORAL_TABLET | Freq: Three times a day (TID) | ORAL | Status: DC

## 2014-12-17 NOTE — Assessment & Plan Note (Signed)
She was supposed to be seen by gynecology since the last visit but never made this appointment. Given that she is high risk with her HIV and high-risk HPV a referral was again made to gynecology and the importance of this follow-up was stressed with her.

## 2014-12-17 NOTE — Assessment & Plan Note (Signed)
The GYN referral was made as noted above. She was given the pneumococcal 13 vaccine today. A DEXA scan was also ordered when we discussed the benefits of screening. We will discuss the issue of Zostavax in the future. She is very interested in getting the flu vaccination and at her return appointment on August 31 we should have our supply in. If we do, she will be vaccinated with the influenza vaccine. She is otherwise up-to-date on her preventative health care maintenance.

## 2014-12-17 NOTE — Progress Notes (Signed)
   Subjective:    Patient ID: Karen Luna, female    DOB: [birth year], 81 y.o.   MRN: [id number]  HPI  Karen Luna is here for follow-up of her hypertension, chronic kidney disease and LGSIL. Please see the A&P for the status of the pt's chronic medical problems.  Review of Systems  Constitutional: Negative for activity change, appetite change and unexpected weight change.  Gastrointestinal: Negative for nausea, vomiting, abdominal pain, diarrhea and constipation.      Objective:   Physical Exam  Constitutional: She is oriented to person, place, and time. She appears well-developed and well-nourished. No distress.  HENT:  Head: Normocephalic and atraumatic.  Eyes: Conjunctivae are normal. Right eye exhibits no discharge. Left eye exhibits no discharge. No scleral icterus.  Cardiovascular: Normal rate, regular rhythm and normal heart sounds.   Pulmonary/Chest: Effort normal and breath sounds normal. No respiratory distress. She has no wheezes. She has no rales.  Abdominal: Soft. Bowel sounds are normal. She exhibits no distension. There is no tenderness. There is no rebound and no guarding.  Musculoskeletal: Normal range of motion. She exhibits no edema or tenderness.  Neurological: She is alert and oriented to person, place, and time. She exhibits normal muscle tone.  Skin: Skin is warm and dry. No rash noted. She is not diaphoretic. No erythema.  Psychiatric: She has a normal mood and affect. Her behavior is normal. Judgment and thought content normal.  Nursing note and vitals reviewed.     Assessment & Plan:   Please see problem oriented charting.

## 2014-12-17 NOTE — Assessment & Plan Note (Signed)
She is followed closely by Dr. Linus Salmons in the [facility] for Infectious Diseases. I will defer pharmacologic management to his expertise.

## 2014-12-17 NOTE — Assessment & Plan Note (Signed)
Her blood pressure today was markedly elevated at 220/82. This is unusual for her and I have no explanation. She states she's been compliant with her amlodipine 10 mg by mouth daily, clonidine 0.2 mg by mouth twice daily, hydralazine 10 mg by mouth 3 times daily, and Diovan-hydrochlorothiazide 320-25 mg by mouth daily. Fortunately, she is completely asymptomatic with this blood pressure. We will increase the hydralazine to 30 mg by mouth 3 times daily and continue the amlodipine 10 mg by mouth daily, clonidine 0.2 mg by mouth twice daily, and Diovan-hydrochlorothiazide 320-25 mg by mouth daily. She was rescheduled to see me on August 31 at 9:45 in the morning to reassess her blood pressure on the higher hydralazine dose. If it is improved to target or requires slight readjustment the appropriate milligram tablet of hydralazine will be prescribed. In the meantime she will continue to use her 10 mg tablets, simply taking 3 tablets at a time.

## 2014-12-17 NOTE — Patient Instructions (Signed)
It was great to see you again.  You look great but your blood pressure is very high.  1)  I increased your hydralazine to 30 mg (3 tablets) by mouth three times a day for your blood pressure.  2) Keep taking your other medications as you are.  3) We gave you the pneumonia shot today.  We will give you the flu shot at the next appointment.  4) I made a referral to the gynecologist to do an examination.  5) I ordered a DEXA scan, which is a bone scan to make sure your bones are strong.  6) I am glad the pain on your right side is better.  I will see you back on December 29, 2014 at 9:45 AM to check your blood pressure.

## 2014-12-17 NOTE — Assessment & Plan Note (Signed)
Her right lower quadrant abdominal pain at the last visit has resolved before any imaging could be done. No further evaluation is necessary given she is now asymptomatic.

## 2014-12-17 NOTE — Assessment & Plan Note (Signed)
Given her stage III chronic kidney disease it is imperative that we get better control of her blood pressure. We will make the adjustments as noted in the hypertension section.

## 2014-12-29 ENCOUNTER — Ambulatory Visit (INDEPENDENT_AMBULATORY_CARE_PROVIDER_SITE_OTHER): Payer: Commercial Managed Care - HMO | Admitting: Internal Medicine

## 2014-12-29 ENCOUNTER — Encounter: Payer: Self-pay | Admitting: Internal Medicine

## 2014-12-29 ENCOUNTER — Ambulatory Visit (HOSPITAL_COMMUNITY)
Admission: RE | Admit: 2014-12-29 | Discharge: 2014-12-29 | Disposition: A | Discharge status: Home / Self Care | Payer: Commercial Managed Care - HMO | Source: Ambulatory Visit | Attending: Internal Medicine | Admitting: Internal Medicine

## 2014-12-29 ENCOUNTER — Encounter: Payer: Self-pay | Admitting: Licensed Clinical Social Worker

## 2014-12-29 VITALS — BP 155/68 | HR 42 | Temp 98.2°F | Wt 174.2 lb

## 2014-12-29 DIAGNOSIS — I459 Conduction disorder, unspecified: Secondary | ICD-10-CM | POA: Diagnosis not present

## 2014-12-29 DIAGNOSIS — Z23 Encounter for immunization: Secondary | ICD-10-CM

## 2014-12-29 DIAGNOSIS — R87612 Low grade squamous intraepithelial lesion on cytologic smear of cervix (LGSIL): Secondary | ICD-10-CM | POA: Diagnosis not present

## 2014-12-29 DIAGNOSIS — R001 Bradycardia, unspecified: Secondary | ICD-10-CM | POA: Diagnosis not present

## 2014-12-29 DIAGNOSIS — Z0000 Encounter for general adult medical examination without abnormal findings: Secondary | ICD-10-CM

## 2014-12-29 DIAGNOSIS — I10 Essential (primary) hypertension: Secondary | ICD-10-CM

## 2014-12-29 MED ORDER — CLONIDINE HCL 0.2 MG PO TABS: ORAL_TABLET | ORAL | Status: DC

## 2014-12-29 NOTE — Assessment & Plan Note (Signed)
As noted above, she developed some orthostatic dizziness and was told to stop the amlodipine. Her blood pressure today was 155/68 on hydralazine 30 mg by mouth 3 times daily, valsartan-hydrochlorothiazide 320-25 mg daily, and clonidine 0.2 mg by mouth twice daily. Given the heart block and underlying bradycardia the decision was made to wean the clonidine. She was therefore continue the hydralazine and valsartan-hydrochlorothiazide at the current doses but change the clonidine to 0.2 mg by mouth twice daily alternating with 0.2 mg once daily until she is re-seen in clinic. I suspect her blood pressure may be elevated at the return visit at which point we will restart the amlodipine at a moderate dose and escalate as we further decrease the clonidine to off.

## 2014-12-29 NOTE — Progress Notes (Signed)
Ms. Karen Luna voiced concern that she paid $77 for a medical bill and does not know why.  Pt does not remember at this time who the bill was from.  Ms. Karen Luna has [facility] HMO SNP and Medicaid.  CSW left business card and request for pt to contact CSW once pt can look up to whom the bill was paid.

## 2014-12-29 NOTE — Assessment & Plan Note (Signed)
She noted orthostatic dizziness last week with standing. She was told by the [facility] pharmacy to stop the amlodipine. By doing this her dizziness completely resolved. She states she is now completely asymptomatic. That being said, she was noted to be bradycardic this morning with a heart rate of 42. Despite being asymptomatic I decided to obtain an EKG to make sure we were not missing a heart block. Of note, her only medications were the anti-retroviral pill which does not cause bradycardia by report, valsartan, hydrochlorothiazide, and clonidine. Obviously, clonidine could cause a bradycardia. Review of her old records reveal a [place] second degree AV block noted on Holter in 2011. EKGs today, including rhythm strips, reveal a sinus arrhythmia with sinus bradycardia and nonconducted premature atrial contractions resulting in occasional junctional escapes. This too, could be caused by the clonidine. Given that she was asymptomatic, admission was felt not to be necessary. The patient, her son, and I discussed my concerns about the clonidine and we all decided to slowly wean off the clonidine while restarting other blood pressure medications in hopes of minimizing the impact of the clonidine on her bradycardia and underlying conduction abnormality. She therefore will take clonidine 0.2 mg by mouth twice daily for one day then alternating with 0.2 mg once daily for one day. She will continue this twice a day alternating with once a day clonidine until she is seen in clinic on October 7 where she will be reassessed and further weaning of the clonidine will likely take place with the reinitiation of lower dose amlodipine to help control her blood pressure. She was instructed to call the clinic immediately should she develop dizziness so that she can be seen right away.

## 2014-12-29 NOTE — Assessment & Plan Note (Signed)
She prefers to have gynecologic follow-up in her hometown of [place]. We are waiting for insurance preapproval. Once this is obtained we will refer her to a gynecologist in [place], [place].

## 2014-12-29 NOTE — Patient Instructions (Addendum)
It was good to see you again.  I am sorry that you are getting charge for your Medicaid card.  We will try to help you figure out why this is.  1) We gave you the flu shot today.  2) Take your clonidine as follows 0.2 mg (1 tablet) by mouth twice a day for one day and then 0.2 mg (1 tablet) once a day for one day.  Continue to alternate between twice a day and once a day until I see you again in clinic.  We are trying to slowly wean you off of this medication as it may be causing your heart block and dizziness to some degree.  3) Keep taking the other medications just as you are.  4) We will work on trying to get you GYN follow-up in [place].  5) We will discontinue the DEXA scan per your request.  I will see you back at my next available slot to make further adjustments in your clonidine and other blood pressure medications.  Call the clinic if you develop dizziness so that we can see you immediately.

## 2014-12-29 NOTE — Progress Notes (Signed)
   Subjective:    Patient ID: Karen Luna, female    DOB: Nov 16, 1931, 81 y.o.   MRN: [id number]  HPI  Karen Luna is here for follow-up of blood pressure control. Please see the A&P for the status of the pt's chronic medical problems.  Review of Systems  Constitutional: Negative for activity change and unexpected weight change.  Respiratory: Negative for shortness of breath.   Cardiovascular: Negative for chest pain, palpitations and leg swelling.  Gastrointestinal: Negative for nausea, vomiting, abdominal pain, diarrhea and constipation.  Musculoskeletal: Negative for arthralgias.  Skin: Negative for rash.  Neurological: Positive for dizziness and light-headedness. Negative for syncope and weakness.       Had dizziness with standing 1 week ago.  Amlodipine stopped and dizziness resolved.      Objective:   Physical Exam  Constitutional: She is oriented to person, place, and time. She appears well-developed and well-nourished. No distress.  HENT:  Head: Normocephalic and atraumatic.  Eyes: Conjunctivae are normal. Right eye exhibits no discharge. Left eye exhibits no discharge. No scleral icterus.  Cardiovascular:  Bradycardic  Musculoskeletal: Normal range of motion. She exhibits no edema.  Neurological: She is alert and oriented to person, place, and time. She exhibits normal muscle tone.  Skin: Skin is warm and dry. She is not diaphoretic.  Psychiatric: She has a normal mood and affect. Her behavior is normal. Judgment and thought content normal.  Nursing note and vitals reviewed.     Assessment & Plan:   Please see problem oriented charting.

## 2014-12-29 NOTE — Assessment & Plan Note (Signed)
At this point she is not interested in a DEXA scan. This is not unreasonable given her overall health, activity, and bone structure. The DEXA scanning was therefore discontinued. She received the flu vaccination today. We will discuss the Zostavax at a future appointment.

## 2014-12-30 ENCOUNTER — Other Ambulatory Visit: Payer: Self-pay | Admitting: Internal Medicine

## 2014-12-30 DIAGNOSIS — I10 Essential (primary) hypertension: Secondary | ICD-10-CM

## 2015-01-06 ENCOUNTER — Encounter: Payer: Self-pay | Admitting: Internal Medicine

## 2015-01-06 DIAGNOSIS — I700 Atherosclerosis of aorta: Secondary | ICD-10-CM | POA: Insufficient documentation

## 2015-01-06 HISTORY — DX: Atherosclerosis of aorta: I70.0

## 2015-01-11 ENCOUNTER — Encounter: Payer: Self-pay | Admitting: *Deleted

## 2015-01-13 ENCOUNTER — Encounter: Payer: Self-pay | Admitting: Obstetrics & Gynecology

## 2015-01-18 ENCOUNTER — Encounter: Payer: Self-pay | Admitting: Obstetrics & Gynecology

## 2015-01-25 ENCOUNTER — Ambulatory Visit (INDEPENDENT_AMBULATORY_CARE_PROVIDER_SITE_OTHER): Payer: Commercial Managed Care - HMO | Admitting: Obstetrics & Gynecology

## 2015-01-25 ENCOUNTER — Encounter: Payer: Self-pay | Admitting: Obstetrics & Gynecology

## 2015-01-25 VITALS — BP 180/114 | Ht 67.0 in | Wt 175.0 lb

## 2015-01-25 DIAGNOSIS — R1031 Right lower quadrant pain: Secondary | ICD-10-CM | POA: Diagnosis not present

## 2015-01-25 NOTE — Progress Notes (Signed)
Patient ID: Karen Luna, female   DOB: 1931-11-23, 81 y.o.   MRN: [id number] Chief Complaint  Patient presents with  . Abdominal Pain    pain on lower right side, off and on for about 2-3 months, there is a knot on her side as well.     Blood pressure 180/114, height 5\' 7"  (1.702 m), weight 175 lb (79.379 kg).  81 y.o. No obstetric history on file. No LMP recorded. Patient has had a hysterectomy. The current method of family planning is status post hysterectomy.  Subjective Pt with several month history of pain right side hip low back No nvd No fc.lherosm    Objective Abdomen soft non tender except deep palaption no masses palapble  Pertinent ROS No burning with urination, frequency or urgency No nausea, vomiting or diarrhea Nor fever chills or other constitutional symptoms   Labs or studies     Impression Diagnoses this Encounter:: No diagnosis found.  Established relevant diagnosis(es):   Plan/Recommendations: No orders of the defined types were placed in this encounter.    Labs or Scans Ordered: No orders of the defined types were placed in this encounter.      Follow up Return in about 1 month (around 02/24/2015) for Follow up, with Dr Elonda Husky.        All questions were answered.  Past Medical History  Diagnosis Date  . Human immunodeficiency virus (HIV) disease ([place]) 09/12/2006  . Essential hypertension 09/19/2006  . Chronic kidney disease, stage 3 09/01/2008  . Low grade squamous intraepithelial lesion (LGSIL) on cervical Pap smear 08/06/2007    LGSIL: 08/06/2007, 11/05/2008, 05/11/2009 ASC-US: 05/27/2008, 08/31/2009, 05/24/2010, 05/23/2012 High Risk HPV: Detected   . Anemia of chronic disease 07/06/2011  . 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 07/25/2012  . Overweight (BMI 25.0-29.9) 07/25/2012  . History of cervical cancer 08/11/2012    s/p hysterectomy, remote, specifics unknown   . Heart murmur     Aortic area  . Chronic venous  insufficiency 10/03/2012  . Aortic atherosclerosis ([place]) 01/06/2015    Seen on CT scan, currently asymptomatic  . Cancer [facility])     uterine( per pt)    Past Surgical History  Procedure Laterality Date  . Abdominal hysterectomy      OB History    No data available      Allergies  Allergen Reactions  . Clonidine Derivatives Other (See Comments)    Non-specific heart block    Social History   Social History  . Marital Status: Widowed    Spouse Name: N/A  . Number of Children: N/A  . Years of Education: N/A   Social History Main Topics  . Smoking status: Former Smoker    Types: Cigarettes    Quit date: 08/06/1988  . Smokeless tobacco: Never Used  . Alcohol Use: No  . Drug Use: No  . Sexual Activity:    Partners: Male    Birth Control/ Protection: Surgical   Other Topics Concern  . None   Social History Narrative    Family History  Problem Relation Age of Onset  . Hypertension Mother   . Heart disease Mother   . Hypertension Father   . Heart disease Father   . Hypertension Sister   . Healthy Brother   . HIV/AIDS Daughter   . Healthy Son   . Healthy Sister   . Healthy Sister   . Healthy Sister   . Healthy Sister   . Early death Sister  Gun shot wound  . Healthy Brother   . Healthy Son   . Cerebrovascular Accident Son

## 2015-02-04 ENCOUNTER — Encounter: Payer: Self-pay | Admitting: Internal Medicine

## 2015-02-04 ENCOUNTER — Ambulatory Visit (INDEPENDENT_AMBULATORY_CARE_PROVIDER_SITE_OTHER): Payer: Commercial Managed Care - HMO | Admitting: Internal Medicine

## 2015-02-04 ENCOUNTER — Ambulatory Visit (HOSPITAL_COMMUNITY)
Admission: RE | Admit: 2015-02-04 | Discharge: 2015-02-04 | Disposition: A | Discharge status: Home / Self Care | Payer: Commercial Managed Care - HMO | Source: Ambulatory Visit | Attending: Internal Medicine | Admitting: Internal Medicine

## 2015-02-04 VITALS — BP 206/82 | HR 50 | Wt 173.7 lb

## 2015-02-04 DIAGNOSIS — I700 Atherosclerosis of aorta: Secondary | ICD-10-CM | POA: Diagnosis not present

## 2015-02-04 DIAGNOSIS — R9431 Abnormal electrocardiogram [ECG] [EKG]: Secondary | ICD-10-CM | POA: Diagnosis not present

## 2015-02-04 DIAGNOSIS — R87612 Low grade squamous intraepithelial lesion on cytologic smear of cervix (LGSIL): Secondary | ICD-10-CM | POA: Diagnosis not present

## 2015-02-04 DIAGNOSIS — I459 Conduction disorder, unspecified: Secondary | ICD-10-CM | POA: Insufficient documentation

## 2015-02-04 DIAGNOSIS — I10 Essential (primary) hypertension: Secondary | ICD-10-CM | POA: Diagnosis not present

## 2015-02-04 MED ORDER — CLONIDINE HCL 0.2 MG PO TABS: ORAL_TABLET | ORAL | Status: DC

## 2015-02-04 MED ORDER — AMLODIPINE BESYLATE 5 MG PO TABS: 5.0000 mg | ORAL_TABLET | Freq: Every day | ORAL | Status: DC

## 2015-02-04 NOTE — Progress Notes (Signed)
   Subjective:    Patient ID: Karen Luna, female    DOB: 05-28-1931, 81 y.o.   MRN: [id number]  HPI  [name] is here for follow-up of her acquired heart block and hypertension. Please see the A&P for the status of the pt's chronic medical problems.  At the last clinic visit she was noted to have an acquired heart block that was felt possibly related to her clonidine. It was therefore decided to wean off the clonidine and she was asked to take 0.2 mg by mouth twice daily alternating with 0.2 mg by mouth once daily. She did this with no development of headaches, visual changes, or dizziness. She is currently without complaints.  Review of Systems  Respiratory: Negative for chest tightness and shortness of breath.   Cardiovascular: Negative for chest pain, palpitations and leg swelling.  Gastrointestinal: Negative for nausea, vomiting, abdominal pain, diarrhea and constipation.  Neurological: Negative for dizziness, syncope, light-headedness and headaches.      Objective:   Physical Exam  Constitutional: She is oriented to person, place, and time. She appears well-developed and well-nourished. No distress.  HENT:  Head: Normocephalic and atraumatic.  Eyes: Conjunctivae are normal. Right eye exhibits no discharge. Left eye exhibits no discharge. No scleral icterus.  Cardiovascular: Normal rate and regular rhythm.  Exam reveals no gallop and no friction rub.   Murmur heard. Pulmonary/Chest: Effort normal and breath sounds normal. No respiratory distress. She has no wheezes. She has no rales.  Musculoskeletal: Normal range of motion. She exhibits no edema or tenderness.  Neurological: She is alert and oriented to person, place, and time. She exhibits normal muscle tone.  Skin: She is not diaphoretic.  Psychiatric: She has a normal mood and affect. Her behavior is normal. Judgment and thought content normal.  Nursing note and vitals reviewed.   ECG demonstrated normal sinus  bradycardia at 46 bpm with a normal axis and intervals, no significant Q waves but there is left ventricular hypertrophy by voltage in aVL. She has mild ST elevation in V1 and V2 with good R wave progression and no other ST or T-wave abnormalities. The ECG is unchanged from the previous ECG on 12/29/2014.    Assessment & Plan:   Please see problem oriented charting.

## 2015-02-04 NOTE — Assessment & Plan Note (Signed)
Since weaning the dose of clonidine she has had no further episodes of dizziness. She remains bradycardic on examination today, but her ECG is unchanged without evidence of significant heart block at this time. I believe that her prior episodes of dizziness were related to the bradycardia and nonspecific heart block we saw at the last clinic visit. This seems to be improving as we wean down the clonidine. We will continue with the clonidine wean with the ultimate goal of discontinuing the medication altogether. At this point she was instructed to take 0.2 mg by mouth daily for 2 weeks and then to take 0.2 mg by mouth every other day until she is seen in clinic again. Because her blood pressure is elevated we decided to restart the amlodipine at 5 mg by mouth daily which is less than the 10 mg by mouth daily she was on previously when she was dizzy. I fully anticipate that she will be hypertensive again upon the return visit given our weaning of the clonidine and she will likely require an escalation in her amlodipine dose at that time. I'm hesitant to be too aggressive with the management of her hypertension given the significant dizziness she experienced prior to the last clinic visit. We will reassess her heart rate and rhythm at the follow-up visit as we continue to wean the clonidine.

## 2015-02-04 NOTE — Assessment & Plan Note (Addendum)
She has follow-up with her GYN to address the low-grade squamous intraepithelial lesion. We will follow-up on the results of this visit at the return visit.

## 2015-02-04 NOTE — Assessment & Plan Note (Signed)
She remains asymptomatic with regards to her aortic atherosclerosis. We are addressing the hypertension as noted above in the context of an acquired heart block. She is also being aggressively treated for her HIV infection. We will reassess for symptoms such as claudication at the follow-up visit.

## 2015-02-04 NOTE — Patient Instructions (Signed)
It was good to see you again.  Your heart is much better on our tracing and by your symptoms.  I am glad you are no longer dizzy.  1) Change your clonidine to 1 tablet (0.2 mg) by mouth daily for 2 weeks then 1 tablet every other day until I see you again.  2) We restarted your amlodipine, but at a lower dose.  Take 1 tablet (5 mg) daily.  3) Keep taking your other medications as you are.  4) I will see you back on October 28 for a blood pressure check and further blood pressure medication adjustments.  If you can not make this appointment I will see you at the next opening I have that you can make.

## 2015-02-04 NOTE — Assessment & Plan Note (Signed)
As noted above we are continuing with the clonidine wean. She was instructed to take clonidine 0.2 mg by mouth daily for 2 weeks and then decrease it to 0.2 mg by mouth every other day until she is seen in clinic. Amlodipine 5 mg by mouth daily was added for her current hypertension and she will continue on the hydralazine 30 mg by mouth 3 times daily and valsartan-hydrochlorothiazide 320-25 mg by mouth daily. We will reassess the blood pressure at the follow-up visit as we continue to wean off the clonidine. I fully expect we will need to increase the amlodipine to 10 mg by mouth daily at the next visit with possibly other antihypertensive escalation as well.

## 2015-02-23 ENCOUNTER — Ambulatory Visit (INDEPENDENT_AMBULATORY_CARE_PROVIDER_SITE_OTHER): Payer: Commercial Managed Care - HMO | Admitting: Obstetrics & Gynecology

## 2015-02-23 ENCOUNTER — Encounter: Payer: Self-pay | Admitting: Obstetrics & Gynecology

## 2015-02-23 VITALS — BP 150/70 | HR 48 | Wt 174.0 lb

## 2015-02-23 DIAGNOSIS — R1031 Right lower quadrant pain: Secondary | ICD-10-CM

## 2015-02-23 NOTE — Progress Notes (Signed)
Patient ID: Karen Luna, female   DOB: 05/09/1931, 81 y.o.   MRN: [id number] Chief Complaint  Patient presents with  . Follow-up    Blood pressure 150/70, pulse 48, weight 174 lb (78.926 kg).  81 y.o. No obstetric history on file. No LMP recorded. Patient has had a hysterectomy. The current method of family planning is status post hysterectomy.  Subjective Pt seen for RLQ pain last month Seen back for follow up Pain persists but is better and originates from the back and hip  Objective   Pertinent ROS No burning with urination, frequency or urgency No nausea, vomiting or diarrhea Nor fever chills or other constitutional symptoms   Labs or studies     Impression Diagnoses this Encounter::   ICD-9-CM ICD-10-CM   1. RLQ abdominal pain 789.03 R10.31     Established relevant diagnosis(es): Past Medical History  Diagnosis Date  . Human immunodeficiency virus (HIV) disease ([place]) 09/12/2006  . Essential hypertension 09/19/2006  . Chronic kidney disease, stage 3 09/01/2008  . Low grade squamous intraepithelial lesion (LGSIL) on cervical Pap smear 08/06/2007    LGSIL: 08/06/2007, 11/05/2008, 05/11/2009 ASC-US: 05/27/2008, 08/31/2009, 05/24/2010, 05/23/2012 High Risk HPV: Detected   . Anemia of chronic disease 07/06/2011  . 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 07/25/2012  . Overweight (BMI 25.0-29.9) 07/25/2012  . History of cervical cancer 08/11/2012    s/p hysterectomy, remote, specifics unknown   . Heart murmur     Aortic area  . Chronic venous insufficiency 10/03/2012  . Aortic atherosclerosis ([place]) 01/06/2015    Seen on CT scan, currently asymptomatic  . Cancer ([place])     uterine( per pt)     Plan/Recommendations: Follow up prn  Labs or Scans Ordered: No orders of the defined types were placed in this encounter.      Follow up Return if symptoms worsen or fail to improve.      Face to face time:  10 minutes  Greater than 50% of the visit time was  spent in counseling and coordination of care with the patient.  The summary and outline of the counseling and care coordination is summarized in the note above.   All questions were answered.  Past Medical History  Diagnosis Date  . Human immunodeficiency virus (HIV) disease ([place]) 09/12/2006  . Essential hypertension 09/19/2006  . Chronic kidney disease, stage 3 09/01/2008  . Low grade squamous intraepithelial lesion (LGSIL) on cervical Pap smear 08/06/2007    LGSIL: 08/06/2007, 11/05/2008, 05/11/2009 ASC-US: 05/27/2008, 08/31/2009, 05/24/2010, 05/23/2012 High Risk HPV: Detected   . Anemia of chronic disease 07/06/2011  . 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 07/25/2012  . Overweight (BMI 25.0-29.9) 07/25/2012  . History of cervical cancer 08/11/2012    s/p hysterectomy, remote, specifics unknown   . Heart murmur     Aortic area  . Chronic venous insufficiency 10/03/2012  . Aortic atherosclerosis ([place]) 01/06/2015    Seen on CT scan, currently asymptomatic  . Cancer [facility])     uterine( per pt)    Past Surgical History  Procedure Laterality Date  . Abdominal hysterectomy      OB History    No data available      Allergies  Allergen Reactions  . Clonidine Derivatives Other (See Comments)    Non-specific heart block    Social History   Social History  . Marital Status: Widowed    Spouse Name: N/A  . Number of Children: N/A  . Years of Education:  N/A   Social History Main Topics  . Smoking status: Former Smoker    Types: Cigarettes    Quit date: 08/06/1988  . Smokeless tobacco: Never Used  . Alcohol Use: No  . Drug Use: No  . Sexual Activity:    Partners: Male    Birth Control/ Protection: Surgical   Other Topics Concern  . None   Social History Narrative    Family History  Problem Relation Age of Onset  . Hypertension Mother   . Heart disease Mother   . Hypertension Father   . Heart disease Father   . Hypertension Sister   . Healthy Brother   . HIV/AIDS  Daughter   . Healthy Son   . Healthy Sister   . Healthy Sister   . Healthy Sister   . Healthy Sister   . Early death Sister     Gun shot wound  . Healthy Brother   . Healthy Son   . Cerebrovascular Accident Son

## 2015-02-24 ENCOUNTER — Ambulatory Visit: Payer: Self-pay | Admitting: Obstetrics & Gynecology

## 2015-02-25 ENCOUNTER — Encounter: Payer: Self-pay | Admitting: Internal Medicine

## 2015-02-25 ENCOUNTER — Ambulatory Visit (INDEPENDENT_AMBULATORY_CARE_PROVIDER_SITE_OTHER): Payer: Commercial Managed Care - HMO | Admitting: Internal Medicine

## 2015-02-25 ENCOUNTER — Ambulatory Visit (HOSPITAL_COMMUNITY)
Admission: RE | Admit: 2015-02-25 | Discharge: 2015-02-25 | Disposition: A | Discharge status: Home / Self Care | Payer: Commercial Managed Care - HMO | Source: Ambulatory Visit | Attending: Internal Medicine | Admitting: Internal Medicine

## 2015-02-25 ENCOUNTER — Other Ambulatory Visit (HOSPITAL_COMMUNITY)
Admission: RE | Admit: 2015-02-25 | Discharge: 2015-02-25 | Disposition: A | Discharge status: Home / Self Care | Payer: Commercial Managed Care - HMO | Source: Ambulatory Visit | Attending: Internal Medicine | Admitting: Internal Medicine

## 2015-02-25 VITALS — BP 181/66 | HR 46 | Temp 98.0°F | Wt 173.2 lb

## 2015-02-25 DIAGNOSIS — Z21 Asymptomatic human immunodeficiency virus [HIV] infection status: Secondary | ICD-10-CM

## 2015-02-25 DIAGNOSIS — I459 Conduction disorder, unspecified: Secondary | ICD-10-CM

## 2015-02-25 DIAGNOSIS — I10 Essential (primary) hypertension: Secondary | ICD-10-CM | POA: Diagnosis not present

## 2015-02-25 DIAGNOSIS — B20 Human immunodeficiency virus [HIV] disease: Secondary | ICD-10-CM | POA: Diagnosis not present

## 2015-02-25 DIAGNOSIS — Z113 Encounter for screening for infections with a predominantly sexual mode of transmission: Secondary | ICD-10-CM | POA: Diagnosis not present

## 2015-02-25 LAB — T-HELPER CELLS (CD4) COUNT (NOT AT ARMC)
CD4 % Helper T Cell: 29 % — ABNORMAL LOW (ref 33–55)
CD4 T CELL ABS: 520 /uL (ref 400–2700)

## 2015-02-25 MED ORDER — AMLODIPINE BESYLATE 10 MG PO TABS: 10.0000 mg | ORAL_TABLET | Freq: Every day | ORAL | Status: DC

## 2015-02-25 NOTE — Assessment & Plan Note (Signed)
She has weaned her clonidine down to 0.2 mg every other day. She denies any palpitations, dizziness, lightheadedness, weakness, or chest pain. An ECG obtained revealed normal sinus bradycardia at 54 bpm with a normal axis and normal intervals. There were no significant Q waves or LVH by voltage. There was good R wave progression and no ST or T wave changes. The rhythm strip was without evidence of a heart block at this time. At this point I feel that her acquired heart block was secondary to the clonidine. It is now safe to discontinue this altogether and she was instructed to do so. We will reassess for any symptoms suggestive of a heart block at the follow-up visit.

## 2015-02-25 NOTE — Progress Notes (Signed)
   Subjective:    Patient ID: Karen Luna, female    DOB: 07/18/1931, 81 y.o.   MRN: [id number]  HPI  Karen Luna is here for follow-up of her acquired heart block and blood pressure with titration off of her clonidine. Please see the A&P for the status of the pt's chronic medical problems.  Review of Systems  Constitutional: Negative for activity change and unexpected weight change.  Respiratory: Negative for chest tightness and shortness of breath.   Cardiovascular: Negative for chest pain, palpitations and leg swelling.  Neurological: Negative for dizziness, syncope, weakness and light-headedness.      Objective:   Physical Exam  Constitutional: She is oriented to person, place, and time. She appears well-developed and well-nourished. No distress.  HENT:  Head: Normocephalic and atraumatic.  Eyes: Conjunctivae are normal. Right eye exhibits no discharge. Left eye exhibits no discharge. No scleral icterus.  Cardiovascular: Normal rate, regular rhythm and normal heart sounds.  Exam reveals no gallop and no friction rub.   No murmur heard. Pulmonary/Chest: Effort normal and breath sounds normal. No respiratory distress. She has no wheezes. She has no rales.  Neurological: She is alert and oriented to person, place, and time. She exhibits normal muscle tone.  Skin: She is not diaphoretic.  Psychiatric: She has a normal mood and affect. Her behavior is normal. Judgment and thought content normal.  Nursing note and vitals reviewed.     Assessment & Plan:   Please see problem oriented charting.

## 2015-02-25 NOTE — Assessment & Plan Note (Signed)
Not unexpectedly, she has had hypertension with the weaning off of her clonidine which caused a heart block that was symptomatic. Today her blood pressures 181/66. We will increase the amlodipine to 10 mg by mouth daily and continue the hydralazine at 30 mg by mouth 3 times daily and valsartan-hydrochlorothiazide 320-25 mg by mouth daily. We will reassess the blood pressure on the newly escalated dose of the amlodipine with the clonidine having been completely weaned off at the follow-up visit.

## 2015-02-25 NOTE — Patient Instructions (Signed)
It was good to see you again.  You have done a nice job coming off of the clonidine and your heart tracing is much improved!  1) Stop taking the clonidine.  2) Increase the amlodipine to 10 mg daily.  3) Keep taking the other medications as you are.  I will see you back in 3 months, sooner if necessary.

## 2015-02-25 NOTE — Assessment & Plan Note (Signed)
She is due for labs in anticipation of her appointment with Dr. Linus Salmons in mid November. Since she comes to [place] from out of town we will reorder these labs under our name so she would not need to return next week to have the blood drawn, as she was already here. I will forward these labs to Dr. Linus Salmons when resulted so that he has them available for the appointment scheduled on November 17.

## 2015-02-26 LAB — LIPID PANEL
CHOLESTEROL TOTAL: 212 mg/dL — AB (ref 100–199)
Chol/HDL Ratio: 4 ratio units (ref 0.0–4.4)
HDL: 53 mg/dL (ref >39)
LDL CALC: 127 mg/dL — AB (ref 0–99)
Triglycerides: 158 mg/dL — ABNORMAL HIGH (ref 0–149)
VLDL CHOLESTEROL CAL: 32 mg/dL (ref 5–40)

## 2015-02-26 LAB — CBC WITH DIFFERENTIAL/PLATELET
BASOS: 0 %
Basophils Absolute: 0 10*3/uL (ref 0.0–0.2)
EOS (ABSOLUTE): 0.2 10*3/uL (ref 0.0–0.4)
Eos: 3 %
HEMOGLOBIN: 11.3 g/dL (ref 11.1–15.9)
Hematocrit: 33.7 % — ABNORMAL LOW (ref 34.0–46.6)
IMMATURE GRANS (ABS): 0 10*3/uL (ref 0.0–0.1)
IMMATURE GRANULOCYTES: 0 %
LYMPHS: 30 %
Lymphocytes Absolute: 1.7 10*3/uL (ref 0.7–3.1)
MCH: 32.2 pg (ref 26.6–33.0)
MCHC: 33.5 g/dL (ref 31.5–35.7)
MCV: 96 fL (ref 79–97)
MONOCYTES: 9 %
Monocytes Absolute: 0.5 10*3/uL (ref 0.1–0.9)
NEUTROS ABS: 3.3 10*3/uL (ref 1.4–7.0)
NEUTROS PCT: 58 %
PLATELETS: 406 10*3/uL — AB (ref 150–379)
RBC: 3.51 x10E6/uL — ABNORMAL LOW (ref 3.77–5.28)
RDW: 14.2 % (ref 12.3–15.4)
WBC: 5.8 10*3/uL (ref 3.4–10.8)

## 2015-02-26 LAB — CMP14 + ANION GAP
ALT: 13 IU/L (ref 0–32)
ANION GAP: 17 mmol/L (ref 10.0–18.0)
AST: 18 IU/L (ref 0–40)
Albumin/Globulin Ratio: 1.1 (ref 1.1–2.5)
Albumin: 4 g/dL (ref 3.5–4.7)
Alkaline Phosphatase: 78 IU/L (ref 39–117)
BUN/Creatinine Ratio: 14 (ref 11–26)
BUN: 16 mg/dL (ref 8–27)
Bilirubin Total: <0.2 mg/dL (ref 0.0–1.2)
CALCIUM: 9.7 mg/dL (ref 8.7–10.3)
CO2: 20 mmol/L (ref 18–29)
CREATININE: 1.15 mg/dL — AB (ref 0.57–1.00)
Chloride: 101 mmol/L (ref 97–106)
GFR, EST AFRICAN AMERICAN: 52 mL/min/{1.73_m2} — AB (ref >59)
GFR, EST NON AFRICAN AMERICAN: 45 mL/min/{1.73_m2} — AB (ref >59)
GLUCOSE: 96 mg/dL (ref 65–99)
Globulin, Total: 3.5 g/dL (ref 1.5–4.5)
POTASSIUM: 3.7 mmol/L (ref 3.5–5.2)
Sodium: 138 mmol/L (ref 136–144)
TOTAL PROTEIN: 7.5 g/dL (ref 6.0–8.5)

## 2015-02-26 LAB — RPR: RPR: NONREACTIVE

## 2015-02-28 LAB — HIV-1 RNA QUANT-NO REFLEX-BLD: HIV-1 RNA Viral Load: <20 copies/mL

## 2015-02-28 LAB — URINE CYTOLOGY ANCILLARY ONLY
CHLAMYDIA, DNA PROBE: NEGATIVE
Neisseria Gonorrhea: NEGATIVE

## 2015-03-03 ENCOUNTER — Other Ambulatory Visit: Payer: Commercial Managed Care - HMO

## 2015-03-17 ENCOUNTER — Ambulatory Visit (INDEPENDENT_AMBULATORY_CARE_PROVIDER_SITE_OTHER): Payer: Commercial Managed Care - HMO | Admitting: Internal Medicine

## 2015-03-17 ENCOUNTER — Encounter: Payer: Self-pay | Admitting: Internal Medicine

## 2015-03-17 VITALS — BP 219/114 | HR 68 | Temp 97.8°F | Wt 174.1 lb

## 2015-03-17 DIAGNOSIS — N183 Chronic kidney disease, stage 3 unspecified: Secondary | ICD-10-CM

## 2015-03-17 DIAGNOSIS — B20 Human immunodeficiency virus [HIV] disease: Secondary | ICD-10-CM | POA: Diagnosis not present

## 2015-03-17 NOTE — Assessment & Plan Note (Signed)
Stable creat, continues to follow IM clinic and managing blood pressur.e

## 2015-03-17 NOTE — Progress Notes (Signed)
Patient ID: Karen Luna, female   DOB: 20-Aug-1931, 81 y.o.   MRN: [id number]  Subjective:    Patient ID: Karen Luna, female    DOB: 1931-08-29, 81 y.o.   MRN: [id number]  HPI She comes in for routine follow up of HIV. On Triumeq and doing well.  No missed doses.  CD4 520, viral load undetectable.  No new issues. Creat remains stable at 1.15.     Review of Systems  Constitutional: Negative for fever, appetite change and fatigue.  HENT: Negative for sore throat and trouble swallowing.   Gastrointestinal: Negative for nausea, abdominal pain and diarrhea.  Musculoskeletal: Negative for myalgias and arthralgias.  Neurological: Negative for dizziness and headaches.       Objective:   Physical Exam  Constitutional: She appears well-developed and well-nourished. No distress.  HENT:  Mouth/Throat: Oropharynx is clear and moist. No oropharyngeal exudate.  Cardiovascular: Normal rate, regular rhythm and normal heart sounds.  Exam reveals no gallop and no friction rub.   No murmur heard. Pulmonary/Chest: Effort normal and breath sounds normal. No respiratory distress. She has no wheezes. She has no rales.          Assessment & Plan:

## 2015-03-17 NOTE — Assessment & Plan Note (Signed)
Doing great, remains undetectable and rtc 4 months.

## 2015-03-24 DIAGNOSIS — X58XXXA Exposure to other specified factors, initial encounter: Secondary | ICD-10-CM | POA: Diagnosis not present

## 2015-03-24 DIAGNOSIS — R9431 Abnormal electrocardiogram [ECG] [EKG]: Secondary | ICD-10-CM | POA: Diagnosis not present

## 2015-03-24 DIAGNOSIS — I429 Cardiomyopathy, unspecified: Secondary | ICD-10-CM | POA: Diagnosis not present

## 2015-03-24 DIAGNOSIS — R55 Syncope and collapse: Secondary | ICD-10-CM | POA: Diagnosis not present

## 2015-03-24 DIAGNOSIS — Y92009 Unspecified place in unspecified non-institutional (private) residence as the place of occurrence of the external cause: Secondary | ICD-10-CM | POA: Diagnosis not present

## 2015-03-24 DIAGNOSIS — I10 Essential (primary) hypertension: Secondary | ICD-10-CM | POA: Diagnosis not present

## 2015-03-24 DIAGNOSIS — Z21 Asymptomatic human immunodeficiency virus [HIV] infection status: Secondary | ICD-10-CM | POA: Diagnosis not present

## 2015-03-24 DIAGNOSIS — R079 Chest pain, unspecified: Secondary | ICD-10-CM | POA: Diagnosis not present

## 2015-03-24 DIAGNOSIS — T7840XA Allergy, unspecified, initial encounter: Secondary | ICD-10-CM | POA: Diagnosis not present

## 2015-03-24 DIAGNOSIS — K5900 Constipation, unspecified: Secondary | ICD-10-CM | POA: Diagnosis not present

## 2015-03-24 DIAGNOSIS — R531 Weakness: Secondary | ICD-10-CM | POA: Diagnosis not present

## 2015-03-24 DIAGNOSIS — R001 Bradycardia, unspecified: Secondary | ICD-10-CM | POA: Diagnosis not present

## 2015-03-24 DIAGNOSIS — Z90710 Acquired absence of both cervix and uterus: Secondary | ICD-10-CM | POA: Diagnosis not present

## 2015-03-28 ENCOUNTER — Other Ambulatory Visit: Payer: Self-pay | Admitting: Internal Medicine

## 2015-03-28 ENCOUNTER — Telehealth: Payer: Self-pay | Admitting: Internal Medicine

## 2015-03-28 MED ORDER — AMLODIPINE BESYLATE 10 MG PO TABS: 10.0000 mg | ORAL_TABLET | Freq: Every day | ORAL | Status: DC

## 2015-03-28 NOTE — Telephone Encounter (Addendum)
Rec'd phone call from the son [name] stating he needs some help with his Mother's medications.  The Patient and him are confused about what medications the she should be taking and would like some assitance.  Alternate number to contact the son is [phone] on his cell phone.

## 2015-03-28 NOTE — Telephone Encounter (Signed)
Based on review of Dr. Caroline More 10/28 note, appears she should be on amlodipine.  Does she need refill?

## 2015-03-28 NOTE — Telephone Encounter (Signed)
Spoke with pt son to review medications per his request.  Pt was seen over the holiday weekend at the ED on [address].  Son reporting pt systolic BP in [id number] and heart rate in 30's.  It was at this time they realized pt was not taking her medications correctly or at all.  He wanted to make pt a pill box to help her manage.  We discussed her ordered medications and he feels he now has the most current list to go on.  Because of ED visit that was reported pt to be seen on Friday for follow-up.  Advised pt son to bring all medications, plus any paperwork they were given from her time in the hospital over the holiday.   Looks like the 10mg  amlodipine just needs to be added back to her list and the 0.2mg  clonidine removed so if printed or referred to list is more accurate.

## 2015-03-28 NOTE — Telephone Encounter (Signed)
Placed on med list as order.  Thanks

## 2015-03-28 NOTE — Telephone Encounter (Signed)
10mg  amlodipine has dropped off of pt medication list.  Should she still be taking?  Please advise

## 2015-03-29 NOTE — Telephone Encounter (Signed)
resolved 

## 2015-04-01 ENCOUNTER — Ambulatory Visit (INDEPENDENT_AMBULATORY_CARE_PROVIDER_SITE_OTHER): Payer: Commercial Managed Care - HMO | Admitting: Internal Medicine

## 2015-04-01 ENCOUNTER — Encounter: Payer: Self-pay | Admitting: Internal Medicine

## 2015-04-01 ENCOUNTER — Ambulatory Visit (HOSPITAL_COMMUNITY)
Admission: RE | Admit: 2015-04-01 | Discharge: 2015-04-01 | Disposition: A | Discharge status: Home / Self Care | Payer: Commercial Managed Care - HMO | Source: Ambulatory Visit | Attending: Internal Medicine | Admitting: Internal Medicine

## 2015-04-01 VITALS — BP 181/57 | HR 84 | Temp 98.0°F | Wt 172.1 lb

## 2015-04-01 DIAGNOSIS — I10 Essential (primary) hypertension: Secondary | ICD-10-CM | POA: Diagnosis not present

## 2015-04-01 DIAGNOSIS — R001 Bradycardia, unspecified: Secondary | ICD-10-CM | POA: Diagnosis not present

## 2015-04-01 DIAGNOSIS — I491 Atrial premature depolarization: Secondary | ICD-10-CM | POA: Insufficient documentation

## 2015-04-01 MED ORDER — AMLODIPINE BESYLATE 10 MG PO TABS: 10.0000 mg | ORAL_TABLET | Freq: Every day | ORAL | Status: DC

## 2015-04-01 MED ORDER — HYDRALAZINE HCL 25 MG PO TABS: 25.0000 mg | ORAL_TABLET | Freq: Three times a day (TID) | ORAL | Status: DC

## 2015-04-01 NOTE — Patient Instructions (Addendum)
Today we adjusted your blood pressure medications as follows:  AMLODIPINE 10MG  once daily HYDRALAZINE 25MG  three times daily TRIUMEQ once daily VALSARTAN-HCTZ 320MG -25MG  once daily   You should plan to return to clinic in approximately 1 week for a blood pressure recheck. We may also decide to refer you for a Cardiology visit at that time if your slow heart rate is not improved.

## 2015-04-01 NOTE — Progress Notes (Signed)
Subjective:   Patient ID: Karen Luna female   DOB: 23-Mar-1932 81 y.o.   MRN: [id number]  HPI: [name] is a 81 y.o. woman with medical history as described below who presents to the clinic for follow up of a hospital admission in [place] for symptomatic hypertension. At that time she was noted to not be taking all antihypertensives as directed and developed chest pain and palpitations with a SBP in the 240s. Since her discharge one week ago on 03/25/15 she has not had these symptoms again, but does feel she is occasionally lightheaded.  See problem based assessment and plan below for additional details.   Past Medical History  Diagnosis Date  . Human immunodeficiency virus (HIV) disease ([place]) 09/12/2006  . Essential hypertension 09/19/2006  . Chronic kidney disease, stage 3 09/01/2008  . Low grade squamous intraepithelial lesion (LGSIL) on cervical Pap smear 08/06/2007    LGSIL: 08/06/2007, 11/05/2008, 05/11/2009 ASC-US: 05/27/2008, 08/31/2009, 05/24/2010, 05/23/2012 High Risk HPV: Detected   . Anemia of chronic disease 07/06/2011  . 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 07/25/2012  . Overweight (BMI 25.0-29.9) 07/25/2012  . History of cervical cancer 08/11/2012    s/p hysterectomy, remote, specifics unknown   . Heart murmur     Aortic area  . Chronic venous insufficiency 10/03/2012  . Aortic atherosclerosis ([place]) 01/06/2015    Seen on CT scan, currently asymptomatic  . Cancer [facility])     uterine( per pt)   Current Outpatient Prescriptions  Medication Sig Dispense Refill  . Abacavir-Dolutegravir-Lamivud 600-50-300 MG TABS Take 1 tablet by mouth daily. 30 tablet 5  . amLODipine (NORVASC) 10 MG tablet Take 1 tablet (10 mg total) by mouth daily. 30 tablet 2  . hydrALAZINE (APRESOLINE) 25 MG tablet Take 1 tablet (25 mg total) by mouth 3 (three) times daily. 90 tablet 2  . valsartan-hydrochlorothiazide (DIOVAN-HCT) 320-25 MG per tablet Take 1 tablet by mouth daily. 90 tablet  3   No current facility-administered medications for this visit.   Family History  Problem Relation Age of Onset  . Hypertension Mother   . Heart disease Mother   . Hypertension Father   . Heart disease Father   . Hypertension Sister   . Healthy Brother   . HIV/AIDS Daughter   . Healthy Son   . Healthy Sister   . Healthy Sister   . Healthy Sister   . Healthy Sister   . Early death Sister     Gun shot wound  . Healthy Brother   . Healthy Son   . Cerebrovascular Accident Son    Social History   Social History  . Marital Status: Widowed    Spouse Name: N/A  . Number of Children: N/A  . Years of Education: N/A   Social History Main Topics  . Smoking status: Former Smoker    Types: Cigarettes    Quit date: 08/06/1988  . Smokeless tobacco: Never Used  . Alcohol Use: No  . Drug Use: No  . Sexual Activity: Not Asked   Other Topics Concern  . None   Social History Narrative   Review of Systems: Review of Systems  Constitutional: Negative for fever and chills.  Respiratory: Negative for shortness of breath.   Cardiovascular: Positive for palpitations. Negative for chest pain.  Gastrointestinal: Negative for nausea and abdominal pain.  Musculoskeletal: Negative for falls.  Neurological: Positive for dizziness. Negative for headaches.    Objective:  Physical Exam: Filed Vitals:  04/01/15 1012  BP: 181/57  Pulse: 84  Temp: 98 F (36.7 C)  TempSrc: Oral  Weight: 172 lb 1.6 oz (78.064 kg)  SpO2: 100%   GENERAL- alert, co-operative, NAD HEENT- Atraumatic, oral mucosa appears moist CARDIAC- RRR, no murmurs, rubs or gallops. RESP- CTAB, no wheezes or crackles. EXTREMITIES- pulse 2+, symmetric, no pedal edema. SKIN- Warm, dry, No rash or lesion. PSYCH- Normal mood and affect, appropriate thought content and speech.   Assessment & Plan:

## 2015-04-03 NOTE — Assessment & Plan Note (Signed)
Assessment: There is a lot of confusion for her about medication therapy after being seen in the hospital while on a trip to [place]. She was hospitalized there with a systolic blood pressure in the 240s with a complaint of chest pain and palpitations. She was noncompliant on her medications at that time due to not bringing them with her. At the hospital her workup was negative for ischemic heart disease and a repeat echo demonstrated appropriate ejection fraction. However there was significant concern for her acquired heart block as they restarted clonidine during hospitalization leading to symptomatically bradycardia as low as the 30s beats per minute with some associated lightheadedness.  Her discharge med reconciliation was not appropriate with the most recently prescribed regimen at this clinic but I do not think there is a treatment failure as there is a known element of noncompliance leading to the acute exacerbation. She is not very clear on her treatment plan and her son has some confusion appropriate medicines, so I think it might be worth considering changing hydralazine to the 25 mg as this reduces number of tablets from 3 TID to 1 TID. I think the most appropriate plan is to resume her medications as intended and reassess seen in the clinic.  Plan: Repeat EKG today for recent history of symptomatic bradycardia Medications reconciled to amlodipine 10 mg daily, hydralazine 25 mg 3 times daily, valsartan hydrochlorothiazide 320-25 mg daily, and discontinue all clonidine. Follow-up for recheck in 1 week

## 2015-04-05 NOTE — Progress Notes (Signed)
Internal Medicine Clinic Attending  I saw and evaluated the patient.  I personally confirmed the key portions of the history and exam documented by Dr. Rice and I reviewed pertinent patient test results.  The assessment, diagnosis, and plan were formulated together and I agree with the documentation in the resident's note.  

## 2015-04-07 ENCOUNTER — Ambulatory Visit (INDEPENDENT_AMBULATORY_CARE_PROVIDER_SITE_OTHER): Payer: Commercial Managed Care - HMO | Admitting: Internal Medicine

## 2015-04-07 ENCOUNTER — Encounter: Payer: Self-pay | Admitting: Internal Medicine

## 2015-04-07 VITALS — BP 140/77 | HR 76 | Temp 97.7°F | Ht 67.0 in | Wt 169.6 lb

## 2015-04-07 DIAGNOSIS — I459 Conduction disorder, unspecified: Secondary | ICD-10-CM

## 2015-04-07 DIAGNOSIS — I10 Essential (primary) hypertension: Secondary | ICD-10-CM

## 2015-04-07 NOTE — Progress Notes (Signed)
Internal Medicine Clinic Attending  I saw and evaluated the patient.  I personally confirmed the key portions of the history and exam documented by Dr. Rice and I reviewed pertinent patient test results.  The assessment, diagnosis, and plan were formulated together and I agree with the documentation in the resident's note.  

## 2015-04-07 NOTE — Assessment & Plan Note (Addendum)
Assessment: Patient is now doing very well on her antihypertensive regimen. Her heart rate today is 76 so her bradycardia does appear to be resolved. Her initial blood pressure today was 173/80 but repeat was 140/70 after sitting. For her age this is entirely adequate and her episodes of lightheadedness are decreasing and now at most once per day. It is possible this lightheadedness is coming from her current 10 mg daily amlodipine dose. However based on her improved blood pressure control today and decreasing frequency of symptoms I would hesitate to titrate down this medicine today. She has not reported any other symptoms associated with her blood pressure or heart rate over the past week. Her son has become increasingly involved in her medicine management and is using a pill planner, and I think this is doing a great job with stabilizing her medication compliance.  Plan: Continue her current medication at amlodipine 10 mg daily, hydralazine 25 mg 3 times daily, valsartan-hydrochlorothiazide 3 20-25 mg daily May need to reassess amlodipine dosing at next visit if orthostatic lightheadedness continues to occur

## 2015-04-07 NOTE — Patient Instructions (Signed)
Your blood pressure is doing well today! Your slow heart rate we discussed last week as a possible cause of your dizziness with standing has improved with stopping clonidine and treating your blood pressure with your new set of medications.  I recommend you to just follow up at your next regular appointment with Dr. Eppie Gibson. If you experience increasingly frequency of lightheadedness or fall at home please contact us sooner. [phone].

## 2015-04-07 NOTE — Progress Notes (Signed)
Subjective:   Patient ID: Karen Luna female   DOB: [birth year] 81 y.o.   MRN: [id number]  HPI: [name] is a 81 y.o. woman with past medical history as described below presenting for 79 week follow-up of hypertension and symptomatic bradycardia. On review of her bradycardia was believed to be associated with acquired heart block from her restarted clonidine after recent hospitalization on a trip to [place]. Her clonidine was discontinued after the last office visit with an increase in her amlodipine to 10 mg daily. Since then she does continue to have occasional lightheaded episodes no more than once per day each lasting up to 2 minutes. She does not have any associated falls or near falls from this lightheadedness.  See problem based assessment and plan below for additional details.   Past Medical History  Diagnosis Date  . Human immunodeficiency virus (HIV) disease ([place]) 09/12/2006  . Essential hypertension 09/19/2006  . Chronic kidney disease, stage 3 09/01/2008  . Low grade squamous intraepithelial lesion (LGSIL) on cervical Pap smear 08/06/2007    LGSIL: 08/06/2007, 11/05/2008, 05/11/2009 ASC-US: 05/27/2008, 08/31/2009, 05/24/2010, 05/23/2012 High Risk HPV: Detected   . Anemia of chronic disease 07/06/2011  . 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 07/25/2012  . Overweight (BMI 25.0-29.9) 07/25/2012  . History of cervical cancer 08/11/2012    s/p hysterectomy, remote, specifics unknown   . Heart murmur     Aortic area  . Chronic venous insufficiency 10/03/2012  . Aortic atherosclerosis ([place]) 01/06/2015    Seen on CT scan, currently asymptomatic  . Cancer [facility])     uterine( per pt)   Current Outpatient Prescriptions  Medication Sig Dispense Refill  . Abacavir-Dolutegravir-Lamivud 600-50-300 MG TABS Take 1 tablet by mouth daily. 30 tablet 5  . amLODipine (NORVASC) 10 MG tablet Take 1 tablet (10 mg total) by mouth daily. 30 tablet 2  . hydrALAZINE (APRESOLINE) 25 MG  tablet Take 1 tablet (25 mg total) by mouth 3 (three) times daily. 90 tablet 2  . valsartan-hydrochlorothiazide (DIOVAN-HCT) 320-25 MG per tablet Take 1 tablet by mouth daily. 90 tablet 3   No current facility-administered medications for this visit.   Family History  Problem Relation Age of Onset  . Hypertension Mother   . Heart disease Mother   . Hypertension Father   . Heart disease Father   . Hypertension Sister   . Healthy Brother   . HIV/AIDS Daughter   . Healthy Son   . Healthy Sister   . Healthy Sister   . Healthy Sister   . Healthy Sister   . Early death Sister     Gun shot wound  . Healthy Brother   . Healthy Son   . Cerebrovascular Accident Son    Social History   Social History  . Marital Status: Widowed    Spouse Name: N/A  . Number of Children: N/A  . Years of Education: N/A   Social History Main Topics  . Smoking status: Former Smoker    Types: Cigarettes    Quit date: 08/06/1988  . Smokeless tobacco: Never Used  . Alcohol Use: No  . Drug Use: No  . Sexual Activity: Not Asked   Other Topics Concern  . None   Social History Narrative   Review of Systems: Review of Systems  Constitutional: Negative for fever and chills.  Eyes: Negative for blurred vision and double vision.  Respiratory: Negative for shortness of breath.   Cardiovascular: Negative for chest pain,  palpitations and leg swelling.  Gastrointestinal: Negative for abdominal pain.  Musculoskeletal: Negative for falls.  Neurological: Positive for dizziness. Negative for weakness and headaches.  Psychiatric/Behavioral: The patient is not nervous/anxious.     Objective:  Physical Exam: Filed Vitals:   04/07/15 1016 04/07/15 1017  BP: 177/63 140/77  Pulse: 79 76  Temp: 97.7 F (36.5 C)   TempSrc: Oral   Height: 5\' 7"  (1.702 m)   Weight: 169 lb 9.6 oz (76.93 kg)   SpO2: 100%    GENERAL- alert, co-operative, NAD HEENT- Atraumatic, oral mucosa appears moist CARDIAC- 3/6 systolic  aortic murmur with radiation to the carotid arteries bilaterally. RESP- CTAB, no wheezes or crackles. EXTREMITIES- radial pulses 3+, symmetric, no pedal edema. SKIN- Warm, dry, No rash or lesion. PSYCH- Normal mood and affect, appropriate thought content and speech.  Assessment & Plan:

## 2015-04-07 NOTE — Addendum Note (Signed)
Addended by: Collier Salina on: 04/07/2015 01:21 PM   Modules accepted: Miquel Dunn

## 2015-04-07 NOTE — Addendum Note (Signed)
Addended by: Lalla Brothers T on: 04/07/2015 02:50 PM   Modules accepted: Level of Service, SmartSet

## 2015-05-27 ENCOUNTER — Encounter: Payer: Self-pay | Admitting: Internal Medicine

## 2015-05-27 ENCOUNTER — Ambulatory Visit (INDEPENDENT_AMBULATORY_CARE_PROVIDER_SITE_OTHER): Payer: Commercial Managed Care - HMO | Admitting: Internal Medicine

## 2015-05-27 VITALS — BP 172/86 | HR 86 | Temp 98.1°F | Wt 176.6 lb

## 2015-05-27 DIAGNOSIS — I10 Essential (primary) hypertension: Secondary | ICD-10-CM | POA: Diagnosis not present

## 2015-05-27 DIAGNOSIS — I459 Conduction disorder, unspecified: Secondary | ICD-10-CM | POA: Diagnosis not present

## 2015-05-27 DIAGNOSIS — Z21 Asymptomatic human immunodeficiency virus [HIV] infection status: Secondary | ICD-10-CM | POA: Diagnosis not present

## 2015-05-27 DIAGNOSIS — B20 Human immunodeficiency virus [HIV] disease: Secondary | ICD-10-CM

## 2015-05-27 MED ORDER — AMLODIPINE BESYLATE 10 MG PO TABS: 10.0000 mg | ORAL_TABLET | Freq: Every day | ORAL | Status: DC

## 2015-05-27 MED ORDER — VALSARTAN-HYDROCHLOROTHIAZIDE 320-25 MG PO TABS: 1.0000 | ORAL_TABLET | Freq: Every day | ORAL | Status: DC

## 2015-05-27 MED ORDER — HYDRALAZINE HCL 50 MG PO TABS: 50.0000 mg | ORAL_TABLET | Freq: Three times a day (TID) | ORAL | Status: DC

## 2015-05-27 MED ORDER — ABACAVIR-DOLUTEGRAVIR-LAMIVUD 600-50-300 MG PO TABS: 1.0000 | ORAL_TABLET | Freq: Every day | ORAL | Status: DC

## 2015-05-27 NOTE — Assessment & Plan Note (Signed)
Assessment  Her HIV disease is very well controlled [facility] Linus Salmons.  Plan  She was instructed to continue the Triumeq 1 tablet daily and continue follow-up with Dr[facility].

## 2015-05-27 NOTE — Progress Notes (Signed)
   Subjective:    Patient ID: Karen Luna, female    DOB: 02-14-1932, 82 y.o.   MRN: [id number]  HPI  ULRIKE COMMONS is here for follow-up of her hypertension, acquired heart block, and HIV disease. Please see the A&P for the status of the pt's chronic medical problems.  Review of Systems  Constitutional: Negative for activity change, appetite change, fatigue and unexpected weight change.  Respiratory: Negative for chest tightness and shortness of breath.   Cardiovascular: Negative for chest pain, palpitations and leg swelling.  Neurological: Negative for dizziness, syncope, weakness, light-headedness and headaches.      Objective:   Physical Exam  Constitutional: She is oriented to person, place, and time. She appears well-developed and well-nourished. No distress.  HENT:  Head: Normocephalic and atraumatic.  Eyes: Conjunctivae are normal. Right eye exhibits no discharge. Left eye exhibits no discharge. No scleral icterus.  Cardiovascular: Normal rate, regular rhythm and normal heart sounds.  Exam reveals no gallop and no friction rub.   No murmur heard. Pulmonary/Chest: Effort normal and breath sounds normal. No respiratory distress. She has no wheezes. She has no rales.  Abdominal: Soft. Bowel sounds are normal. She exhibits no distension. There is no tenderness. There is no rebound and no guarding.  Musculoskeletal: Normal range of motion. She exhibits no edema or tenderness.  Neurological: She is alert and oriented to person, place, and time. She exhibits normal muscle tone.  Skin: Skin is warm and dry. No rash noted. She is not diaphoretic. No erythema.  Psychiatric: She has a normal mood and affect. Her behavior is normal. Judgment and thought content normal.  Nursing note and vitals reviewed.     Assessment & Plan:   Please see problem oriented charting.

## 2015-05-27 NOTE — Patient Instructions (Signed)
It was good to see you again.  Your heart rate is much better off of the clonidine!  1) Keep taking the Amlodipine (Norvasc) 10 mg daily, Valsartan-hydrochlorothiazide 320-25 mg (Diovan) 1 tablet daily, and increase the Hydralazine (Apresoline) to 50 mg three times daily because your blood pressure was still to high today.  I sent the new prescription for the hydralazine to the [facility] in [place] since the [facility] is closing.  2) Keep taking the Triumeq 1 tablet daily.  3) I transferred all of your prescriptions to the [facility] in [place] because the [facility] is closing.  If you would prefer another pharmacy let me know by a phone call and I will make the change.  4) Never take clonidine again as it slows your heart down too much.  I will see you back in three months for a blood pressure check, sooner if necessary.

## 2015-05-27 NOTE — Assessment & Plan Note (Signed)
Assessment  Since stopping the clonidine altogether her heart block seems to have resolved. Her pulse today in clinic was 86 on her current blood pressure regimen.  Plan  I once again stressed the importance of never taking clonidine again. I also put this into a note for her son to further clarify and make sure everyone understands clonidine as not to be taken by her ever again. There is no need for an EKG today given her pulse. We will reassess for evidence of bradycardia on her current regimen at the follow-up visit.

## 2015-05-27 NOTE — Assessment & Plan Note (Signed)
Assessment  Her blood pressure remains elevated despite a 4 drug regimen. I am encouraged that her son is much more involved in the management of her hypertension and there seems to be a lot less confusion regarding her medications. Although I feel she still does not have a good understanding of what medication she is on her compliance has improved and we have finally gotten rid of the clonidine which was causing her problems.  Plan  We will continue the amlodipine at 10 mg by mouth daily, valsartan-hydrochlorothiazide 320-25 mg 1 tablet per mouth daily, and increase the hydralazine to 50 mg by mouth 3 times daily. We will reassess her blood pressure control and look for any symptoms of dizziness at the follow-up visit. Depending on how she is doing further adjustments may be required or made.

## 2015-06-03 ENCOUNTER — Other Ambulatory Visit: Payer: Self-pay | Admitting: *Deleted

## 2015-06-03 DIAGNOSIS — B20 Human immunodeficiency virus [HIV] disease: Secondary | ICD-10-CM

## 2015-06-03 MED ORDER — ABACAVIR-DOLUTEGRAVIR-LAMIVUD 600-50-300 MG PO TABS: 1.0000 | ORAL_TABLET | Freq: Every day | ORAL | Status: DC

## 2015-08-02 ENCOUNTER — Ambulatory Visit (INDEPENDENT_AMBULATORY_CARE_PROVIDER_SITE_OTHER): Payer: Commercial Managed Care - HMO | Admitting: Internal Medicine

## 2015-08-02 ENCOUNTER — Encounter: Payer: Self-pay | Admitting: Internal Medicine

## 2015-08-02 VITALS — BP 172/76 | HR 89 | Temp 97.5°F | Ht 67.0 in | Wt 175.0 lb

## 2015-08-02 DIAGNOSIS — B20 Human immunodeficiency virus [HIV] disease: Secondary | ICD-10-CM

## 2015-08-02 DIAGNOSIS — N183 Chronic kidney disease, stage 3 unspecified: Secondary | ICD-10-CM

## 2015-08-02 NOTE — Assessment & Plan Note (Signed)
Will check her bmp today to be sure it is stable.

## 2015-08-02 NOTE — Progress Notes (Signed)
Patient ID: Karen Luna, female   DOB: Jan 13, 1932, 82 y.o.   MRN: [id number]  Subjective:    Patient ID: Karen Luna, female    DOB: May 20, 1931, 82 y.o.   MRN: [id number]  HPI She comes in for routine follow up of HIV. On Triumeq and doing well.  No missed doses.  CD4 520, viral load undetectable.  No new issues. Creat has remained stable, no labs prior to this visit.     Review of Systems  Constitutional: Negative for fever, appetite change and fatigue.  HENT: Negative for trouble swallowing.   Gastrointestinal: Negative for diarrhea.  Skin: Negative for rash.  Neurological: Negative for dizziness.       Objective:   Physical Exam  Constitutional: She appears well-developed and well-nourished. No distress.  HENT:  Mouth/Throat: Oropharynx is clear and moist. No oropharyngeal exudate.  Cardiovascular: Normal rate, regular rhythm and normal heart sounds.  Exam reveals no gallop and no friction rub.   No murmur heard. Pulmonary/Chest: Effort normal and breath sounds normal. No respiratory distress. She has no wheezes. She has no rales.          Assessment & Plan:

## 2015-08-02 NOTE — Assessment & Plan Note (Signed)
Doing well.  Labs today and rtc 4-5 months

## 2015-08-03 LAB — T-HELPER CELL (CD4) - (RCID CLINIC ONLY)
CD4 T CELL ABS: 380 /uL — AB (ref 400–2700)
CD4 T CELL HELPER: 21 % — AB (ref 33–55)

## 2015-08-03 LAB — HIV-1 RNA QUANT-NO REFLEX-BLD
HIV 1 RNA Quant: <20 copies/mL (ref <20)
HIV-1 RNA Quant, Log: <1.3 Log copies/mL (ref <1.30)

## 2015-09-09 ENCOUNTER — Ambulatory Visit: Payer: Self-pay | Admitting: Internal Medicine

## 2015-09-09 ENCOUNTER — Encounter: Payer: Self-pay | Admitting: Internal Medicine

## 2015-09-09 ENCOUNTER — Ambulatory Visit (INDEPENDENT_AMBULATORY_CARE_PROVIDER_SITE_OTHER): Payer: Commercial Managed Care - HMO | Admitting: Internal Medicine

## 2015-09-09 VITALS — BP 181/67 | HR 85 | Temp 98.1°F | Wt 179.9 lb

## 2015-09-09 DIAGNOSIS — I10 Essential (primary) hypertension: Secondary | ICD-10-CM | POA: Diagnosis not present

## 2015-09-09 DIAGNOSIS — I872 Venous insufficiency (chronic) (peripheral): Secondary | ICD-10-CM | POA: Diagnosis not present

## 2015-09-09 DIAGNOSIS — Z09 Encounter for follow-up examination after completed treatment for conditions other than malignant neoplasm: Secondary | ICD-10-CM

## 2015-09-09 DIAGNOSIS — Z21 Asymptomatic human immunodeficiency virus [HIV] infection status: Secondary | ICD-10-CM

## 2015-09-09 DIAGNOSIS — Z8679 Personal history of other diseases of the circulatory system: Secondary | ICD-10-CM | POA: Diagnosis not present

## 2015-09-09 DIAGNOSIS — I700 Atherosclerosis of aorta: Secondary | ICD-10-CM

## 2015-09-09 DIAGNOSIS — I459 Conduction disorder, unspecified: Secondary | ICD-10-CM

## 2015-09-09 DIAGNOSIS — B351 Tinea unguium: Secondary | ICD-10-CM

## 2015-09-09 DIAGNOSIS — B20 Human immunodeficiency virus [HIV] disease: Secondary | ICD-10-CM

## 2015-09-09 HISTORY — DX: Tinea unguium: B35.1

## 2015-09-09 MED ORDER — HYDRALAZINE HCL 100 MG PO TABS: 100.0000 mg | ORAL_TABLET | Freq: Three times a day (TID) | ORAL | Status: DC

## 2015-09-09 NOTE — Assessment & Plan Note (Signed)
Assessment  She states she is tolerating the Triumeq well. She is followed closely by Dr. Linus Salmons in the [facility].  Plan  She will continue the Triumeq at the current dose and follow-up with Dr. Linus Salmons at the [facility]. I will defer further therapy for her HIV disease to him.

## 2015-09-09 NOTE — Assessment & Plan Note (Signed)
We will offer her the Zostavax at the follow-up visit. Otherwise she is up-to-date on her health care maintenance.

## 2015-09-09 NOTE — Assessment & Plan Note (Signed)
Assessment  She has onychomycoses of the left great toe with an extremely long thick toenail in a valgus deformity position. Although this nail was not hurting her she is having trouble filing and down.  Plan  A referral was made to podiatry to help her manage her left great toe onychomycoses and long thick nail. We will reassess her nail at the follow-up visit.

## 2015-09-09 NOTE — Assessment & Plan Note (Signed)
Assessment  Her blood pressure today is elevated at 181/67. This is on amlodipine 10 mg by mouth daily, hydralazine 50 mg by mouth 3 times daily, and valsartan-hydrochlorothiazide 320-25 mg by mouth daily. She will require better control of her blood pressure in order to achieve our target.  Plan  We will continue the amlodipine 10 mg by mouth daily and valsartan-hydrochlorothiazide 320-25 mg by mouth daily. We will increase hydralazine to 100 mg by mouth 3 times daily. We will reassess blood pressure control at the follow-up visit.

## 2015-09-09 NOTE — Patient Instructions (Signed)
It was great to see you again.  Your blood pressure remains high so we have made some adjustments today.  1) Increase your hydralazine to 100 mg three times a day.  2) Keep taking your other medications just as you are.  3) We measured you for compression stockings to help get the fluid out of your legs.  4) I sent a referral to the foot doctor to help you with the toe nail on your big toe on the left foot.  I will see you back in 3 months for a blood pressure recheck, sooner if necessary.

## 2015-09-09 NOTE — Assessment & Plan Note (Signed)
Assessment  She denies any claudication associated with her aortic atherosclerosis at this time.  Plan  At the follow-up visit visit we will reassess for evidence of claudication or other signs and symptoms suggesting symptomatic aortic atherosclerotic disease. In the meantime we will continue risk factor modification, particularly of her hypertension has noted above.

## 2015-09-09 NOTE — Progress Notes (Signed)
   Subjective:    Patient ID: Karen Luna, female    DOB: 10/15/1931, 82 y.o.   MRN: [id number]  HPI  Karen Luna is here for follow-up of her hypertension, acquired heart block, and HIV disease. Please see the A&P for the status of the pt's chronic medical problems.  Review of Systems  Constitutional: Negative for activity change, appetite change and unexpected weight change.  Respiratory: Negative for chest tightness, shortness of breath and wheezing.   Cardiovascular: Positive for leg swelling. Negative for chest pain and palpitations.  Gastrointestinal: Negative for nausea, vomiting, abdominal pain, diarrhea and constipation.  Musculoskeletal: Negative for myalgias, back pain and arthralgias.  Skin: Negative for rash and wound.  Neurological: Negative for dizziness and weakness.      Objective:   Physical Exam  Constitutional: She is oriented to person, place, and time. She appears well-developed and well-nourished. No distress.  HENT:  Head: Normocephalic and atraumatic.  Eyes: Conjunctivae are normal. Right eye exhibits no discharge. Left eye exhibits no discharge. No scleral icterus.  Cardiovascular: Normal rate and regular rhythm.  Exam reveals no gallop and no friction rub.   Murmur heard. II/VI systolic murmur at the LUSB  Pulmonary/Chest: Effort normal and breath sounds normal. No respiratory distress. She has no wheezes. She has no rales.  Abdominal: Soft. Bowel sounds are normal. She exhibits no distension. There is no tenderness. There is no rebound and no guarding.  Musculoskeletal: Normal range of motion. She exhibits edema. She exhibits no tenderness.  Neurological: She is alert and oriented to person, place, and time. She exhibits normal muscle tone.  Skin: Skin is warm and dry. No rash noted. She is not diaphoretic. No erythema.  Psychiatric: She has a normal mood and affect. Her behavior is normal. Judgment and thought content normal.  Nursing note and  vitals reviewed.     Assessment & Plan:   Please see problem oriented charting.

## 2015-09-09 NOTE — Assessment & Plan Note (Signed)
Assessment  She continues to have bilateral lower extremity swelling likely related to chronic venous insufficiency versus her amlodipine. Amlodipine remains an important therapy for her given her resistant hypertension. We will therefore empirically treat for her chronic venous insufficiency.  Plan  She was measured for compression stockings and these will be sent to her house. We will reassess the control of her lower extremity edema with the compression stockings at the follow-up visit.

## 2015-09-09 NOTE — Assessment & Plan Note (Signed)
Assessment  She is no longer on the clonidine and her pulse today is 85. I believe her acquired heart block was related to the clonidine.  Plan  We will continue to manage her hypertension without clonidine. We will reassess for a heart block at the follow-up visit.

## 2015-09-27 DIAGNOSIS — B351 Tinea unguium: Secondary | ICD-10-CM | POA: Diagnosis not present

## 2015-09-27 DIAGNOSIS — M79676 Pain in unspecified toe(s): Secondary | ICD-10-CM | POA: Diagnosis not present

## 2016-01-04 ENCOUNTER — Telehealth: Payer: Self-pay | Admitting: Internal Medicine

## 2016-01-04 NOTE — Telephone Encounter (Signed)
APT. REMINDER CALL, NO ANSWER, NO VOICEMAIL °

## 2016-01-05 ENCOUNTER — Encounter: Payer: Self-pay | Admitting: Internal Medicine

## 2016-01-05 ENCOUNTER — Ambulatory Visit (INDEPENDENT_AMBULATORY_CARE_PROVIDER_SITE_OTHER): Payer: Commercial Managed Care - HMO | Admitting: Internal Medicine

## 2016-01-05 VITALS — BP 153/52 | HR 94 | Temp 98.1°F | Wt 161.3 lb

## 2016-01-05 DIAGNOSIS — I700 Atherosclerosis of aorta: Secondary | ICD-10-CM

## 2016-01-05 DIAGNOSIS — Z21 Asymptomatic human immunodeficiency virus [HIV] infection status: Secondary | ICD-10-CM

## 2016-01-05 DIAGNOSIS — B20 Human immunodeficiency virus [HIV] disease: Secondary | ICD-10-CM

## 2016-01-05 DIAGNOSIS — B351 Tinea unguium: Secondary | ICD-10-CM | POA: Diagnosis not present

## 2016-01-05 DIAGNOSIS — I10 Essential (primary) hypertension: Secondary | ICD-10-CM

## 2016-01-05 DIAGNOSIS — I872 Venous insufficiency (chronic) (peripheral): Secondary | ICD-10-CM

## 2016-01-05 DIAGNOSIS — E663 Overweight: Secondary | ICD-10-CM

## 2016-01-05 DIAGNOSIS — Z0000 Encounter for general adult medical examination without abnormal findings: Secondary | ICD-10-CM

## 2016-01-05 DIAGNOSIS — Z23 Encounter for immunization: Secondary | ICD-10-CM

## 2016-01-05 DIAGNOSIS — Z6825 Body mass index (BMI) 25.0-25.9, adult: Secondary | ICD-10-CM

## 2016-01-05 DIAGNOSIS — Z87891 Personal history of nicotine dependence: Secondary | ICD-10-CM

## 2016-01-05 NOTE — Assessment & Plan Note (Signed)
She was given the flu vaccination today. We will discuss the Zostavax at the follow-up visit and make sure that she is a candidate given her CD4 count of 380. A DEXA scan was never scheduled and we will do so so we have the results at the follow-up visit. She is otherwise up-to-date on her health care maintenance.

## 2016-01-05 NOTE — Assessment & Plan Note (Signed)
Assessment  She has lost 18 pounds since the last clinic visit and she was praised on this tremendous progress. Her BMI is just over 25.  Plan  She was encouraged to continue to try to lose a few more pounds to normalize her BMI and likely her blood pressure. We will reassess her success at achieving a normal BMI and blood pressure at the follow-up visit.

## 2016-01-05 NOTE — Progress Notes (Signed)
   Subjective:    Patient ID: Karen Luna, female    DOB: 10-26-1931, 82 y.o.   MRN: [id number]  HPI  Karen Luna is here for follow-up of her blood pressure, LE swelling, HIV disease, and onychomyocoses. Please see the A&P for the status of the pt's chronic medical problems.  Review of Systems  Constitutional: Negative for activity change, appetite change and unexpected weight change.  Respiratory: Negative for chest tightness, shortness of breath and wheezing.   Cardiovascular: Negative for chest pain, palpitations and leg swelling.  Gastrointestinal: Negative for abdominal pain, constipation, diarrhea, nausea and vomiting.  Musculoskeletal: Negative for arthralgias, back pain, joint swelling and myalgias.  Skin: Negative for rash and wound.  Neurological: Negative for dizziness, syncope, weakness and light-headedness.      Objective:   Physical Exam  Constitutional: She is oriented to person, place, and time. She appears well-developed and well-nourished. No distress.  HENT:  Head: Normocephalic and atraumatic.  Eyes: Conjunctivae are normal. Right eye exhibits no discharge. Left eye exhibits no discharge. No scleral icterus.  Cardiovascular: Normal rate and regular rhythm.  Exam reveals no gallop and no friction rub.   Murmur heard. Pulmonary/Chest: Effort normal and breath sounds normal. No respiratory distress. She has no wheezes. She has no rales.  Abdominal: Soft. Bowel sounds are normal. She exhibits no distension. There is no tenderness. There is no rebound and no guarding.  Musculoskeletal: Normal range of motion. She exhibits no edema, tenderness or deformity.  Neurological: She is alert and oriented to person, place, and time. She exhibits normal muscle tone.  Skin: Skin is warm and dry. No rash noted. She is not diaphoretic. No erythema.  Psychiatric: She has a normal mood and affect. Her behavior is normal. Judgment and thought content normal.  Nursing note and  vitals reviewed.     Assessment & Plan:   Please see problem oriented charting.

## 2016-01-05 NOTE — Assessment & Plan Note (Signed)
Assessment  She continues to deny symptoms consistent with clinically significant aortic atherosclerotic disease such as claudication.  Plan  We will continue aggressive management of her hypertension as well as her HIV disease. At the age of 18 I do not believe that the addition of aspirin or statin therapy will benefit her more than the potential risks given how well she is doing clinically and the fact that she remains asymptomatic otherwise.

## 2016-01-05 NOTE — Patient Instructions (Signed)
It was good to see you again!  You are taking very good care of yourself!!!!  1) Keep taking your medications as you are.  Your blood pressure is much better.  2) We will get that bone scan scheduled to make sure your bones are strong.  3) We gave you the flu shot today.  I will see you back in 6 months, sooner if necessary.

## 2016-01-05 NOTE — Assessment & Plan Note (Signed)
Assessment  Her blood pressure today is very near target. It is 153/52. This is after an 18 pound weight loss as well as taking her current regimen of amlodipine 10 mg by mouth daily, valsartan-hydrochlorothiazide 320-25 mg 1 tablet daily, and hydralazine 100 mg by mouth 3 times daily. She is tolerating this 4 drug regimen well.  Plan  We will continue with the 4 drug regimen at the doses noted above. We will reassess her blood pressure at the follow-up visit. As her target systolic blood pressure is less than 150 and we are just 3 points above that I feel we've made a lot of progress. I will say that her 18 pound weight loss has also been helpful in achieving this blood pressure control and I believe if she could lose a few more pounds and normalize her BMI her blood pressure may fall within the target range.

## 2016-01-05 NOTE — Assessment & Plan Note (Signed)
Assessment  With weight loss and a more aggressive antihypertensive regimen that has lowered her blood pressure to near target ranges her chronic venous insufficiency has improved and she states she's had no lower extremity swelling recently.  Plan  We will continue to assess for evidence of recurrence of her chronic venous insufficiency at the follow-up visit. At this point, no additional therapy is required.

## 2016-01-05 NOTE — Assessment & Plan Note (Signed)
Assessment  She saw Dr. Irving Shows, a podiatrist in [facility], in the interim. He successfully filed down her onychomycotic left great toe thick nail. She is quite happy with this.  Plan  She was encouraged to continue to follow-up with Dr. Irving Shows on an as-needed basis to file down this very thick toenail.

## 2016-01-05 NOTE — Assessment & Plan Note (Signed)
Assessment  She is tolerating her antiretroviral therapy well and is followed closely by Dr. Linus Salmons in the [facility]. Within the last 6 months her CD4 count is 380 and her viral load is less than 20.  Plan  She will continue with her current retroviral regimen and follow-up in the [facility].

## 2016-01-10 ENCOUNTER — Encounter: Payer: Self-pay | Admitting: Internal Medicine

## 2016-01-10 ENCOUNTER — Ambulatory Visit (INDEPENDENT_AMBULATORY_CARE_PROVIDER_SITE_OTHER): Payer: Commercial Managed Care - HMO | Admitting: Internal Medicine

## 2016-01-10 VITALS — BP 170/71 | HR 109 | Temp 98.4°F | Wt 164.0 lb

## 2016-01-10 DIAGNOSIS — B20 Human immunodeficiency virus [HIV] disease: Secondary | ICD-10-CM | POA: Diagnosis not present

## 2016-01-10 DIAGNOSIS — Z79899 Other long term (current) drug therapy: Secondary | ICD-10-CM | POA: Diagnosis not present

## 2016-01-10 DIAGNOSIS — N183 Chronic kidney disease, stage 3 unspecified: Secondary | ICD-10-CM

## 2016-01-10 DIAGNOSIS — Z113 Encounter for screening for infections with a predominantly sexual mode of transmission: Secondary | ICD-10-CM | POA: Diagnosis not present

## 2016-01-10 LAB — CBC WITH DIFFERENTIAL/PLATELET
BASOS ABS: 0 {cells}/uL (ref 0–200)
Basophils Relative: 0 %
Eosinophils Absolute: 51 cells/uL (ref 15–500)
Eosinophils Relative: 1 %
HEMATOCRIT: 28.2 % — AB (ref 35.0–45.0)
HEMOGLOBIN: 9.6 g/dL — AB (ref 11.7–15.5)
LYMPHS ABS: 1224 {cells}/uL (ref 850–3900)
Lymphocytes Relative: 24 %
MCH: 35.6 pg — AB (ref 27.0–33.0)
MCHC: 34 g/dL (ref 32.0–36.0)
MCV: 104.4 fL — ABNORMAL HIGH (ref 80.0–100.0)
MONO ABS: 561 {cells}/uL (ref 200–950)
MPV: 8.7 fL (ref 7.5–12.5)
Monocytes Relative: 11 %
NEUTROS ABS: 3264 {cells}/uL (ref 1500–7800)
NEUTROS PCT: 64 %
Platelets: 467 10*3/uL — ABNORMAL HIGH (ref 140–400)
RBC: 2.7 MIL/uL — AB (ref 3.80–5.10)
RDW: 15.2 % — ABNORMAL HIGH (ref 11.0–15.0)
WBC: 5.1 10*3/uL (ref 3.8–10.8)

## 2016-01-10 LAB — COMPLETE METABOLIC PANEL WITH GFR
ALT: 10 U/L (ref 6–29)
AST: 15 U/L (ref 10–35)
Albumin: 4.1 g/dL (ref 3.6–5.1)
Alkaline Phosphatase: 65 U/L (ref 33–130)
BUN: 19 mg/dL (ref 7–25)
CHLORIDE: 104 mmol/L (ref 98–110)
CO2: 21 mmol/L (ref 20–31)
Calcium: 9.9 mg/dL (ref 8.6–10.4)
Creat: 1.41 mg/dL — ABNORMAL HIGH (ref 0.60–0.88)
GFR, EST NON AFRICAN AMERICAN: 35 mL/min — AB (ref >=60)
GFR, Est African American: 40 mL/min — ABNORMAL LOW (ref >=60)
GLUCOSE: 122 mg/dL — AB (ref 65–99)
POTASSIUM: 3.2 mmol/L — AB (ref 3.5–5.3)
SODIUM: 138 mmol/L (ref 135–146)
Total Bilirubin: 0.3 mg/dL (ref 0.2–1.2)
Total Protein: 7.6 g/dL (ref 6.1–8.1)

## 2016-01-10 LAB — LIPID PANEL
CHOL/HDL RATIO: 3.1 ratio (ref <=5.0)
CHOLESTEROL: 183 mg/dL (ref 125–200)
HDL: 60 mg/dL (ref >=46)
LDL CALC: 94 mg/dL (ref <130)
TRIGLYCERIDES: 144 mg/dL (ref <150)
VLDL: 29 mg/dL (ref <30)

## 2016-01-10 NOTE — Assessment & Plan Note (Addendum)
Doing well.  Labs today and rtc 6 months.  Ok from an ID standpoint to do zostivax with CD4 in the 300s with well controlled virus.

## 2016-01-10 NOTE — Assessment & Plan Note (Signed)
Will check yearly RPR

## 2016-01-10 NOTE — Progress Notes (Signed)
Patient ID: Karen Luna, female   DOB: 04/23/1932, 82 y.o.   MRN: [id number]  Subjective:    Patient ID: Karen Luna, female    DOB: [birth year], 82 y.o.   MRN: [id number]  HPI She comes in for routine follow up of HIV. On Triumeq and doing well.  No missed doses.  CD4 380, viral load undetectable last visit.  No new issues. Creat has remained stable, no labs prior to this visit.  Saw her PCP and already had flu shot.  Some weight loss.      Review of Systems  Constitutional: Negative for appetite change, fatigue and fever.  HENT: Negative for trouble swallowing.   Gastrointestinal: Negative for diarrhea.  Skin: Negative for rash.  Neurological: Negative for dizziness.       Objective:   Physical Exam  Constitutional: She appears well-developed and well-nourished. No distress.  HENT:  Mouth/Throat: Oropharynx is clear and moist. No oropharyngeal exudate.  Cardiovascular: Normal rate, regular rhythm and normal heart sounds.  Exam reveals no gallop and no friction rub.   No murmur heard. Pulmonary/Chest: Effort normal and breath sounds normal. No respiratory distress. She has no wheezes. She has no rales.   Social History   Social History  . Marital status: Widowed    Spouse name: N/A  . Number of children: N/A  . Years of education: N/A   Occupational History  . Not on file.   Social History Main Topics  . Smoking status: Former Smoker    Types: Cigarettes    Quit date: 08/06/1988  . Smokeless tobacco: Never Used  . Alcohol use No  . Drug use: No  . Sexual activity: Not on file   Other Topics Concern  . Not on file   Social History Narrative  . No narrative on file         Assessment & Plan:

## 2016-01-10 NOTE — Assessment & Plan Note (Signed)
Will check lipids. 

## 2016-01-10 NOTE — Assessment & Plan Note (Signed)
I will recheck her creat today to be sure it is stable

## 2016-01-11 LAB — HIV-1 RNA QUANT-NO REFLEX-BLD

## 2016-01-11 LAB — T-HELPER CELL (CD4) - (RCID CLINIC ONLY)
CD4 T CELL HELPER: 23 % — AB (ref 33–55)
CD4 T Cell Abs: 270 /uL — ABNORMAL LOW (ref 400–2700)

## 2016-01-11 LAB — RPR

## 2016-02-08 ENCOUNTER — Encounter (HOSPITAL_COMMUNITY): Payer: Self-pay

## 2016-02-08 ENCOUNTER — Emergency Department (HOSPITAL_COMMUNITY): Payer: Commercial Managed Care - HMO

## 2016-02-08 ENCOUNTER — Emergency Department (HOSPITAL_COMMUNITY)
Admission: EM | Admit: 2016-02-08 | Discharge: 2016-02-08 | Disposition: A | Discharge status: Home / Self Care | Payer: Commercial Managed Care - HMO | Attending: Emergency Medicine | Admitting: Emergency Medicine

## 2016-02-08 DIAGNOSIS — Z79899 Other long term (current) drug therapy: Secondary | ICD-10-CM | POA: Insufficient documentation

## 2016-02-08 DIAGNOSIS — Z87891 Personal history of nicotine dependence: Secondary | ICD-10-CM | POA: Diagnosis not present

## 2016-02-08 DIAGNOSIS — R42 Dizziness and giddiness: Secondary | ICD-10-CM | POA: Insufficient documentation

## 2016-02-08 DIAGNOSIS — N183 Chronic kidney disease, stage 3 (moderate): Secondary | ICD-10-CM | POA: Diagnosis not present

## 2016-02-08 DIAGNOSIS — I129 Hypertensive chronic kidney disease with stage 1 through stage 4 chronic kidney disease, or unspecified chronic kidney disease: Secondary | ICD-10-CM | POA: Diagnosis not present

## 2016-02-08 LAB — CBC WITH DIFFERENTIAL/PLATELET
BASOS ABS: 0 10*3/uL (ref 0.0–0.1)
BASOS PCT: 0 %
EOS PCT: 0 %
Eosinophils Absolute: 0 10*3/uL (ref 0.0–0.7)
HEMATOCRIT: 28.2 % — AB (ref 36.0–46.0)
Hemoglobin: 9.5 g/dL — ABNORMAL LOW (ref 12.0–15.0)
LYMPHS PCT: 35 %
Lymphs Abs: 2.5 10*3/uL (ref 0.7–4.0)
MCH: 35.7 pg — ABNORMAL HIGH (ref 26.0–34.0)
MCHC: 33.7 g/dL (ref 30.0–36.0)
MCV: 106 fL — AB (ref 78.0–100.0)
MONO ABS: 1 10*3/uL (ref 0.1–1.0)
MONOS PCT: 13 %
NEUTROS ABS: 3.7 10*3/uL (ref 1.7–7.7)
Neutrophils Relative %: 52 %
PLATELETS: 445 10*3/uL — AB (ref 150–400)
RBC: 2.66 MIL/uL — ABNORMAL LOW (ref 3.87–5.11)
RDW: 13.3 % (ref 11.5–15.5)
WBC: 7.2 10*3/uL (ref 4.0–10.5)

## 2016-02-08 LAB — COMPREHENSIVE METABOLIC PANEL
ALT: 13 U/L — ABNORMAL LOW (ref 14–54)
ANION GAP: 9 (ref 5–15)
AST: 21 U/L (ref 15–41)
Albumin: 4.3 g/dL (ref 3.5–5.0)
Alkaline Phosphatase: 55 U/L (ref 38–126)
BILIRUBIN TOTAL: 0.5 mg/dL (ref 0.3–1.2)
BUN: 17 mg/dL (ref 6–20)
CO2: 23 mmol/L (ref 22–32)
Calcium: 9.8 mg/dL (ref 8.9–10.3)
Chloride: 107 mmol/L (ref 101–111)
Creatinine, Ser: 1.65 mg/dL — ABNORMAL HIGH (ref 0.44–1.00)
GFR calc Af Amer: 32 mL/min — ABNORMAL LOW (ref >60)
GFR, EST NON AFRICAN AMERICAN: 28 mL/min — AB (ref >60)
GLUCOSE: 107 mg/dL — AB (ref 65–99)
Potassium: 3.7 mmol/L (ref 3.5–5.1)
Sodium: 139 mmol/L (ref 135–145)
Total Protein: 8.3 g/dL — ABNORMAL HIGH (ref 6.5–8.1)

## 2016-02-08 MED ORDER — SODIUM CHLORIDE 0.9 % IV BOLUS (SEPSIS)
1000.0000 mL | Freq: Once | INTRAVENOUS | Status: AC
  Administered 2016-02-08: 1000 mL via INTRAVENOUS

## 2016-02-08 NOTE — ED Triage Notes (Addendum)
Pt reports dizziness that started at 2pm today.  Pt says she had to walk outside to a program they were having at an apt complex and dizziness started after she got there.   Denies any other symptoms.

## 2016-02-08 NOTE — ED Notes (Signed)
Patient transported to CT 

## 2016-02-08 NOTE — ED Notes (Signed)
Pt and family updated on plan of care,  

## 2016-02-08 NOTE — ED Notes (Signed)
MD at bedside. 

## 2016-02-08 NOTE — ED Provider Notes (Signed)
[facility] DEPT Provider Note   CSN: [id number] Arrival date & time: 02/08/16  1806     History   Chief Complaint Chief Complaint  Patient presents with  . Dizziness    HPI Karen Luna is a 82 y.o. female.  Patient states she had some dizziness today. Patient states she just had lightheadedness. She thought her blood pressure was up. She feels better now   The history is provided by the patient. No language interpreter was used.  Dizziness  Quality:  Lightheadedness Severity:  Mild Onset quality:  Sudden Timing:  Intermittent Progression:  Resolved Chronicity:  New Context: not when bending over   Relieved by:  Nothing Associated symptoms: no chest pain, no diarrhea and no headaches     Past Medical History:  Diagnosis Date  . Anemia of chronic disease 07/06/2011  . Aortic atherosclerosis ([place]) 01/06/2015   Seen on CT scan, currently asymptomatic  . Cancer ([place])    uterine( per pt)  . Chronic kidney disease, stage 3 09/01/2008  . Chronic venous insufficiency 10/03/2012  . Essential hypertension 09/19/2006  . Heart murmur    Aortic area  . History of cervical cancer 08/11/2012   s/p hysterectomy, remote, specifics unknown   . Human immunodeficiency virus (HIV) disease ([place]) 09/12/2006  . 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 07/25/2012  . Low grade squamous intraepithelial lesion (LGSIL) on cervical Pap smear 08/06/2007   LGSIL: 08/06/2007, 11/05/2008, 05/11/2009 ASC-US: 05/27/2008, 08/31/2009, 05/24/2010, 05/23/2012 High Risk HPV: Detected   . Onychomycosis of left great toe 09/09/2015  . Overweight (BMI 25.0-29.9) 07/25/2012    Patient Active Problem List   Diagnosis Date Noted  . Onychomycosis of left great toe 09/09/2015  . Aortic atherosclerosis ([place]) 01/06/2015  . Acquired heart block 12/29/2014  . Encounter for long-term (current) use of medications 09/16/2014  . Screening examination for venereal disease 09/16/2014  . Chronic venous  insufficiency 10/03/2012  . History of cervical cancer 08/11/2012  . Overweight (BMI 25.0-29.9) 07/25/2012  . Healthcare maintenance 07/25/2012  . 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 07/25/2012  . Anemia of chronic disease 07/06/2011  . Chronic kidney disease, stage 3 09/01/2008  . Low grade squamous intraepithelial lesion (LGSIL) on cervical Pap smear 08/06/2007  . Essential hypertension 09/19/2006  . Human immunodeficiency virus (HIV) disease ([place]) 09/12/2006    Past Surgical History:  Procedure Laterality Date  . ABDOMINAL HYSTERECTOMY      OB History    No data available       Home Medications    Prior to Admission medications   Medication Sig Start Date End Date Taking? Authorizing Provider  abacavir-dolutegravir-lamiVUDine (TRIUMEQ) 600-50-300 MG tablet Take 1 tablet by mouth daily. 06/03/15  Yes Thayer Headings, MD  amLODipine (NORVASC) 10 MG tablet Take 1 tablet (10 mg total) by mouth daily. 05/27/15 05/26/16 Yes [name], MD  hydrALAZINE (APRESOLINE) 100 MG tablet Take 1 tablet (100 mg total) by mouth 3 (three) times daily. 09/09/15  Yes [name], MD  valsartan-hydrochlorothiazide (DIOVAN-HCT) 320-25 MG tablet Take 1 tablet by mouth daily. 05/27/15  Yes [name], MD    Family History Family History  Problem Relation Age of Onset  . Hypertension Mother   . Heart disease Mother   . Hypertension Father   . Heart disease Father   . Hypertension Sister   . Healthy Brother   . HIV/AIDS Daughter   . Healthy Son   . Healthy Sister   . Healthy Sister   .  Healthy Sister   . Healthy Sister   . Early death Sister     Gun shot wound  . Healthy Brother   . Healthy Son   . Cerebrovascular Accident Son     Social History Social History  Substance Use Topics  . Smoking status: Former Smoker    Types: Cigarettes    Quit date: 08/06/1988  . Smokeless tobacco: Never Used  . Alcohol use No     Allergies   Clonidine  derivatives   Review of Systems Review of Systems  Constitutional: Negative for appetite change and fatigue.  HENT: Negative for congestion, ear discharge and sinus pressure.   Eyes: Negative for discharge.  Respiratory: Negative for cough.   Cardiovascular: Negative for chest pain.  Gastrointestinal: Negative for abdominal pain and diarrhea.  Genitourinary: Negative for frequency and hematuria.  Musculoskeletal: Negative for back pain.  Skin: Negative for rash.  Neurological: Positive for dizziness. Negative for seizures and headaches.  Psychiatric/Behavioral: Negative for hallucinations.     Physical Exam Updated Vital Signs BP 164/65   Pulse 76   Temp 97.7 F (36.5 C) (Oral)   Resp 20   SpO2 100%   Physical Exam  Constitutional: She is oriented to person, place, and time. She appears well-developed.  HENT:  Head: Normocephalic.  Eyes: Conjunctivae and EOM are normal. No scleral icterus.  Neck: Neck supple. No thyromegaly present.  Cardiovascular: Normal rate and regular rhythm.  Exam reveals no gallop and no friction rub.   No murmur heard. Pulmonary/Chest: No stridor. She has no wheezes. She has no rales. She exhibits no tenderness.  Abdominal: She exhibits no distension. There is no tenderness. There is no rebound.  Musculoskeletal: Normal range of motion. She exhibits no edema.  Lymphadenopathy:    She has no cervical adenopathy.  Neurological: She is oriented to person, place, and time. She exhibits normal muscle tone. Coordination normal.  Skin: No rash noted. No erythema.  Psychiatric: She has a normal mood and affect. Her behavior is normal.     ED Treatments / Results  Labs (all labs ordered are listed, but only abnormal results are displayed) Labs Reviewed  CBC WITH DIFFERENTIAL/PLATELET - Abnormal; Notable for the following:       Result Value   RBC 2.66 (*)    Hemoglobin 9.5 (*)    HCT 28.2 (*)    MCV 106.0 (*)    MCH 35.7 (*)    Platelets 445  (*)    All other components within normal limits  COMPREHENSIVE METABOLIC PANEL - Abnormal; Notable for the following:    Glucose, Bld 107 (*)    Creatinine, Ser 1.65 (*)    Total Protein 8.3 (*)    ALT 13 (*)    GFR calc non Af Amer 28 (*)    GFR calc Af Amer 32 (*)    All other components within normal limits    EKG  EKG Interpretation None       Radiology Ct Head Wo Contrast  Result Date: 02/08/2016 CLINICAL DATA:  Dizziness EXAM: CT HEAD WITHOUT CONTRAST TECHNIQUE: Contiguous axial images were obtained from the base of the skull through the vertex without intravenous contrast. COMPARISON:  None. FINDINGS: Brain: No evidence of acute infarction, hemorrhage, hydrocephalus, extra-axial collection or mass lesion/mass effect. Mild to moderate periventricular and sub cortical white matter confluent hypodensities favor small vessel ischemic change. Probable old right gangliocapsular lacunar infarcts. Mild cortical atrophy. Vascular: Calcifications present within the carotid arteries at the  skullbase. No hyperdense vessels. Skull: Mastoid air cells are clear. No skull fracture. No suspicious calvarial lesions. Sinuses/Orbits: Moderate opacification of the right sphenoid and maxillary sinuses. Right maxillary sinus contains high density secretions. Globes appear grossly intact. Other: None IMPRESSION: 1. No definite CT evidence for acute intracranial abnormality. 2. Scattered bilateral periventricular and subcortical white matter hypodensities, favor small vessel ischemic disease. Suspect old lacune ends in the right basal ganglia. MRI follow-up may be obtained as clinically indicated. 3. Paranasal sinus disease. Electronically Signed   By: Donavan Foil M.D.   On: 02/08/2016 19:26    Procedures Procedures (including critical care time)  Medications Ordered in ED Medications  sodium chloride 0.9 % bolus 1,000 mL (0 mLs Intravenous Stopped 02/08/16 2105)     Initial Impression /  Assessment and Plan / ED Course  I have reviewed the triage vital signs and the nursing notes.  Pertinent labs & imaging results that were available during my care of the patient were reviewed by me and considered in my medical decision making (see chart for details).  Clinical Course    Patient with dizziness. Symptoms resolved. Suspect symptoms are related to either her blood pressure dehydration. She will follow-up with PCP  Final Clinical Impressions(s) / ED Diagnoses   Final diagnoses:  Dizziness    New Prescriptions New Prescriptions   No medications on file     Milton Ferguson, MD 02/08/16 2120

## 2016-02-08 NOTE — Discharge Instructions (Signed)
Drink plenty of fluids and follow-up with their family doctor about her blood pressure next week

## 2016-03-24 ENCOUNTER — Observation Stay (HOSPITAL_COMMUNITY)
Admission: EM | Admit: 2016-03-24 | Discharge: 2016-03-25 | Disposition: A | Discharge status: Home / Self Care | Payer: Commercial Managed Care - HMO | Attending: Family Medicine | Admitting: Family Medicine

## 2016-03-24 ENCOUNTER — Emergency Department (HOSPITAL_COMMUNITY): Payer: Commercial Managed Care - HMO

## 2016-03-24 ENCOUNTER — Encounter (HOSPITAL_COMMUNITY): Payer: Self-pay | Admitting: *Deleted

## 2016-03-24 ENCOUNTER — Other Ambulatory Visit: Payer: Self-pay

## 2016-03-24 DIAGNOSIS — N390 Urinary tract infection, site not specified: Secondary | ICD-10-CM | POA: Diagnosis not present

## 2016-03-24 DIAGNOSIS — Z79899 Other long term (current) drug therapy: Secondary | ICD-10-CM | POA: Insufficient documentation

## 2016-03-24 DIAGNOSIS — B20 Human immunodeficiency virus [HIV] disease: Secondary | ICD-10-CM | POA: Diagnosis present

## 2016-03-24 DIAGNOSIS — I129 Hypertensive chronic kidney disease with stage 1 through stage 4 chronic kidney disease, or unspecified chronic kidney disease: Secondary | ICD-10-CM | POA: Diagnosis not present

## 2016-03-24 DIAGNOSIS — N183 Chronic kidney disease, stage 3 unspecified: Secondary | ICD-10-CM | POA: Diagnosis present

## 2016-03-24 DIAGNOSIS — R4182 Altered mental status, unspecified: Secondary | ICD-10-CM | POA: Diagnosis not present

## 2016-03-24 DIAGNOSIS — N3000 Acute cystitis without hematuria: Secondary | ICD-10-CM

## 2016-03-24 DIAGNOSIS — Z21 Asymptomatic human immunodeficiency virus [HIV] infection status: Secondary | ICD-10-CM | POA: Diagnosis not present

## 2016-03-24 DIAGNOSIS — N1832 Chronic kidney disease, stage 3b: Secondary | ICD-10-CM | POA: Diagnosis present

## 2016-03-24 DIAGNOSIS — Z8541 Personal history of malignant neoplasm of cervix uteri: Secondary | ICD-10-CM | POA: Insufficient documentation

## 2016-03-24 DIAGNOSIS — I441 Atrioventricular block, second degree: Principal | ICD-10-CM | POA: Insufficient documentation

## 2016-03-24 DIAGNOSIS — I700 Atherosclerosis of aorta: Secondary | ICD-10-CM | POA: Diagnosis present

## 2016-03-24 DIAGNOSIS — G9340 Encephalopathy, unspecified: Secondary | ICD-10-CM | POA: Diagnosis not present

## 2016-03-24 DIAGNOSIS — E7220 Disorder of urea cycle metabolism, unspecified: Secondary | ICD-10-CM

## 2016-03-24 LAB — RAPID URINE DRUG SCREEN, HOSP PERFORMED
Amphetamines: NOT DETECTED
Barbiturates: NOT DETECTED
Benzodiazepines: NOT DETECTED
Cocaine: NOT DETECTED
OPIATES: NOT DETECTED
TETRAHYDROCANNABINOL: NOT DETECTED

## 2016-03-24 LAB — CBC WITH DIFFERENTIAL/PLATELET
Basophils Absolute: 0 10*3/uL (ref 0.0–0.1)
Basophils Relative: 0 %
Eosinophils Absolute: 0 10*3/uL (ref 0.0–0.7)
Eosinophils Relative: 0 %
HCT: 28.7 % — ABNORMAL LOW (ref 36.0–46.0)
HEMOGLOBIN: 9.5 g/dL — AB (ref 12.0–15.0)
LYMPHS ABS: 0.8 10*3/uL (ref 0.7–4.0)
LYMPHS PCT: 14 %
MCH: 35.7 pg — AB (ref 26.0–34.0)
MCHC: 33.1 g/dL (ref 30.0–36.0)
MCV: 107.9 fL — AB (ref 78.0–100.0)
Monocytes Absolute: 0.6 10*3/uL (ref 0.1–1.0)
Monocytes Relative: 11 %
NEUTROS PCT: 75 %
Neutro Abs: 4.4 10*3/uL (ref 1.7–7.7)
Platelets: 393 10*3/uL (ref 150–400)
RBC: 2.66 MIL/uL — AB (ref 3.87–5.11)
RDW: 14.3 % (ref 11.5–15.5)
WBC: 5.9 10*3/uL (ref 4.0–10.5)

## 2016-03-24 LAB — URINALYSIS, ROUTINE W REFLEX MICROSCOPIC
BILIRUBIN URINE: NEGATIVE
Glucose, UA: NEGATIVE mg/dL
HGB URINE DIPSTICK: NEGATIVE
Ketones, ur: NEGATIVE mg/dL
Nitrite: POSITIVE — AB
Protein, ur: NEGATIVE mg/dL
SPECIFIC GRAVITY, URINE: 1.01 (ref 1.005–1.030)
pH: 5.5 (ref 5.0–8.0)

## 2016-03-24 LAB — COMPREHENSIVE METABOLIC PANEL
ALK PHOS: 54 U/L (ref 38–126)
ALT: 17 U/L (ref 14–54)
AST: 31 U/L (ref 15–41)
Albumin: 4.4 g/dL (ref 3.5–5.0)
Anion gap: 9 (ref 5–15)
BUN: 25 mg/dL — ABNORMAL HIGH (ref 6–20)
CALCIUM: 10.3 mg/dL (ref 8.9–10.3)
CO2: 21 mmol/L — AB (ref 22–32)
CREATININE: 1.48 mg/dL — AB (ref 0.44–1.00)
Chloride: 108 mmol/L (ref 101–111)
GFR, EST AFRICAN AMERICAN: 37 mL/min — AB (ref >60)
GFR, EST NON AFRICAN AMERICAN: 32 mL/min — AB (ref >60)
Glucose, Bld: 115 mg/dL — ABNORMAL HIGH (ref 65–99)
Potassium: 4.4 mmol/L (ref 3.5–5.1)
Sodium: 138 mmol/L (ref 135–145)
Total Bilirubin: 0.6 mg/dL (ref 0.3–1.2)
Total Protein: 8.1 g/dL (ref 6.5–8.1)

## 2016-03-24 LAB — TROPONIN I

## 2016-03-24 LAB — AMMONIA: AMMONIA: 67 umol/L — AB (ref 9–35)

## 2016-03-24 LAB — URINE MICROSCOPIC-ADD ON: RBC / HPF: NONE SEEN RBC/hpf (ref 0–5)

## 2016-03-24 LAB — TSH: TSH: 1.846 u[IU]/mL (ref 0.350–4.500)

## 2016-03-24 MED ORDER — TRAZODONE HCL 50 MG PO TABS
50.0000 mg | ORAL_TABLET | Freq: Every evening | ORAL | Status: DC | PRN
  Administered 2016-03-24: 50 mg via ORAL
  Filled 2016-03-24: qty 1

## 2016-03-24 MED ORDER — SODIUM CHLORIDE 0.9% FLUSH: 3.0000 mL | INTRAVENOUS | Status: DC | PRN

## 2016-03-24 MED ORDER — LORAZEPAM 0.5 MG PO TABS
0.5000 mg | ORAL_TABLET | Freq: Every day | ORAL | Status: DC | PRN
  Administered 2016-03-24: 0.5 mg via ORAL
  Filled 2016-03-24: qty 1

## 2016-03-24 MED ORDER — ACETAMINOPHEN 325 MG PO TABS
650.0000 mg | ORAL_TABLET | Freq: Four times a day (QID) | ORAL | Status: DC | PRN
Start: 2016-03-24 — End: 2016-03-25

## 2016-03-24 MED ORDER — IRBESARTAN 300 MG PO TABS
300.0000 mg | ORAL_TABLET | Freq: Every day | ORAL | Status: DC
  Administered 2016-03-24 – 2016-03-25 (×2): 300 mg via ORAL
  Filled 2016-03-24 (×2): qty 1

## 2016-03-24 MED ORDER — ACETAMINOPHEN 650 MG RE SUPP: 650.0000 mg | Freq: Four times a day (QID) | RECTAL | Status: DC | PRN

## 2016-03-24 MED ORDER — SODIUM CHLORIDE 0.9% FLUSH
3.0000 mL | Freq: Two times a day (BID) | INTRAVENOUS | Status: DC
  Administered 2016-03-24 – 2016-03-25 (×3): 3 mL via INTRAVENOUS

## 2016-03-24 MED ORDER — AMLODIPINE BESYLATE 5 MG PO TABS
10.0000 mg | ORAL_TABLET | Freq: Every day | ORAL | Status: DC
  Administered 2016-03-24 – 2016-03-25 (×2): 10 mg via ORAL
  Filled 2016-03-24 (×2): qty 2

## 2016-03-24 MED ORDER — SODIUM CHLORIDE 0.9 % IV SOLN: Freq: Once | INTRAVENOUS | Status: DC

## 2016-03-24 MED ORDER — HYDROCHLOROTHIAZIDE 25 MG PO TABS
25.0000 mg | ORAL_TABLET | Freq: Every day | ORAL | Status: DC
  Administered 2016-03-24 – 2016-03-25 (×2): 25 mg via ORAL
  Filled 2016-03-24 (×2): qty 1

## 2016-03-24 MED ORDER — SODIUM CHLORIDE 0.9% FLUSH
3.0000 mL | Freq: Two times a day (BID) | INTRAVENOUS | Status: DC
  Administered 2016-03-24 – 2016-03-25 (×2): 3 mL via INTRAVENOUS

## 2016-03-24 MED ORDER — HYDRALAZINE HCL 25 MG PO TABS
100.0000 mg | ORAL_TABLET | Freq: Three times a day (TID) | ORAL | Status: DC
  Administered 2016-03-24 – 2016-03-25 (×4): 100 mg via ORAL
  Filled 2016-03-24 (×4): qty 4

## 2016-03-24 MED ORDER — SODIUM CHLORIDE 0.9 % IV SOLN: INTRAVENOUS | Status: DC

## 2016-03-24 MED ORDER — ABACAVIR-DOLUTEGRAVIR-LAMIVUD 600-50-300 MG PO TABS
1.0000 | ORAL_TABLET | Freq: Every day | ORAL | Status: DC
  Administered 2016-03-24 – 2016-03-25 (×2): 1 via ORAL
  Filled 2016-03-24 (×3): qty 1

## 2016-03-24 MED ORDER — CEFTRIAXONE SODIUM 1 G IJ SOLR
1.0000 g | Freq: Once | INTRAMUSCULAR | Status: AC
  Administered 2016-03-24: 1 g via INTRAVENOUS
  Filled 2016-03-24: qty 10

## 2016-03-24 MED ORDER — DEXTROSE 5 % IV SOLN
1.0000 g | INTRAVENOUS | Status: DC
  Administered 2016-03-25: 1 g via INTRAVENOUS
  Filled 2016-03-24 (×2): qty 10

## 2016-03-24 MED ORDER — SODIUM CHLORIDE 0.9 % IV BOLUS (SEPSIS)
1000.0000 mL | Freq: Once | INTRAVENOUS | Status: AC
  Administered 2016-03-24: 1000 mL via INTRAVENOUS

## 2016-03-24 MED ORDER — VALSARTAN-HYDROCHLOROTHIAZIDE 320-25 MG PO TABS: 1.0000 | ORAL_TABLET | Freq: Every day | ORAL | Status: DC

## 2016-03-24 MED ORDER — SODIUM CHLORIDE 0.9 % IV SOLN: 250.0000 mL | INTRAVENOUS | Status: DC | PRN

## 2016-03-24 NOTE — ED Triage Notes (Signed)
Family states pt has not been acting right noticed starting a week ago & really got worse around 2 this morning. Pt became tearful during triage. Just wanting to be checked. Denies having any pain.

## 2016-03-24 NOTE — Care Management Obs Status (Signed)
[facility] NOTIFICATION   Patient Details  Name: Karen Luna MRN: [id number] Date of Birth: 11-01-1931   Medicare Observation Status Notification Given:  Yes    Briant Sites, RN 03/24/2016, 2:49 PM

## 2016-03-24 NOTE — ED Provider Notes (Signed)
[facility] DEPT Provider Note   CSN: [id number] Arrival date & time: 03/24/16  [id number]     History   Chief Complaint Chief Complaint  Patient presents with  . Altered Mental Status    HPI Karen Luna is a 82 y.o. female.  Level V caveat for altered mental status. Patient called her sister this morning tearful stating that she was "going out of her mind". States she's been having trouble remembering. He remembers going to bed last night. Called her sister this morning he could not remember the date or the year. She denies any pain. She denies any falls or injuries. She denies any headache. Denies any fever or recent illness. Her son states she had an episode of this 2 days ago which resolves. Patient is very tearful and anxious. She states she cannot remember why she is here. She denies any headache, neck pain, chest pain, abdominal pain. No vomiting or diarrhea. No recent medication changes. Does have history of HIV and states compliance with her medications.   The history is provided by the patient.  Altered Mental Status      Past Medical History:  Diagnosis Date  . Anemia of chronic disease 07/06/2011  . Aortic atherosclerosis ([place]) 01/06/2015   Seen on CT scan, currently asymptomatic  . Cancer ([place])    uterine( per pt)  . Chronic kidney disease, stage 3 09/01/2008  . Chronic venous insufficiency 10/03/2012  . Essential hypertension 09/19/2006  . Heart murmur    Aortic area  . History of cervical cancer 08/11/2012   s/p hysterectomy, remote, specifics unknown   . Human immunodeficiency virus (HIV) disease ([place]) 09/12/2006  . 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 07/25/2012  . Low grade squamous intraepithelial lesion (LGSIL) on cervical Pap smear 08/06/2007   LGSIL: 08/06/2007, 11/05/2008, 05/11/2009 ASC-US: 05/27/2008, 08/31/2009, 05/24/2010, 05/23/2012 High Risk HPV: Detected   . Onychomycosis of left great toe 09/09/2015  . Overweight (BMI 25.0-29.9) 07/25/2012     Patient Active Problem List   Diagnosis Date Noted  . Onychomycosis of left great toe 09/09/2015  . Aortic atherosclerosis ([place]) 01/06/2015  . Acquired heart block 12/29/2014  . Encounter for long-term (current) use of medications 09/16/2014  . Screening examination for venereal disease 09/16/2014  . Chronic venous insufficiency 10/03/2012  . History of cervical cancer 08/11/2012  . Overweight (BMI 25.0-29.9) 07/25/2012  . Healthcare maintenance 07/25/2012  . 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 07/25/2012  . Anemia of chronic disease 07/06/2011  . Chronic kidney disease, stage 3 09/01/2008  . Low grade squamous intraepithelial lesion (LGSIL) on cervical Pap smear 08/06/2007  . Essential hypertension 09/19/2006  . Human immunodeficiency virus (HIV) disease ([place]) 09/12/2006    Past Surgical History:  Procedure Laterality Date  . ABDOMINAL HYSTERECTOMY      OB History    No data available       Home Medications    Prior to Admission medications   Medication Sig Start Date End Date Taking? Authorizing Provider  abacavir-dolutegravir-lamiVUDine (TRIUMEQ) 600-50-300 MG tablet Take 1 tablet by mouth daily. 06/03/15  Yes Thayer Headings, MD  amLODipine (NORVASC) 10 MG tablet Take 1 tablet (10 mg total) by mouth daily. 05/27/15 05/26/16 Yes [name], MD  hydrALAZINE (APRESOLINE) 100 MG tablet Take 1 tablet (100 mg total) by mouth 3 (three) times daily. 09/09/15  Yes [name], MD  valsartan-hydrochlorothiazide (DIOVAN-HCT) 320-25 MG tablet Take 1 tablet by mouth daily. 05/27/15  Yes [name], MD  Family History Family History  Problem Relation Age of Onset  . Hypertension Mother   . Heart disease Mother   . Hypertension Father   . Heart disease Father   . Hypertension Sister   . Healthy Brother   . HIV/AIDS Daughter   . Healthy Son   . Healthy Sister   . Healthy Sister   . Healthy Sister   . Healthy Sister   . Early death Sister      Gun shot wound  . Healthy Brother   . Healthy Son   . Cerebrovascular Accident Son     Social History Social History  Substance Use Topics  . Smoking status: Former Smoker    Types: Cigarettes    Quit date: 08/06/1988  . Smokeless tobacco: Never Used  . Alcohol use No     Allergies   Clonidine derivatives   Review of Systems Review of Systems  Unable to perform ROS: Mental status change     Physical Exam Updated Vital Signs BP 160/81 (BP Location: Left Arm)   Pulse 89   Temp 98.1 F (36.7 C) (Oral)   Resp 18   Ht 5\' 9"  (1.753 m)   Wt 150 lb (68 kg)   SpO2 100%   BMI 22.15 kg/m   Physical Exam  Constitutional: She appears well-developed and well-nourished. No distress.  Tearful and anxious  HENT:  Head: Normocephalic and atraumatic.  Mouth/Throat: Oropharynx is clear and moist. No oropharyngeal exudate.  Eyes: Conjunctivae and EOM are normal. Pupils are equal, round, and reactive to light.  Neck: Normal range of motion. Neck supple.  No meningismus.  Cardiovascular: Normal rate, regular rhythm, normal heart sounds and intact distal pulses.   No murmur heard. Pulmonary/Chest: Effort normal and breath sounds normal. No respiratory distress.  Abdominal: Soft. There is no tenderness. There is no rebound and no guarding.  Musculoskeletal: Normal range of motion. She exhibits no edema or tenderness.  Neurological: She is alert. No cranial nerve deficit. She exhibits normal muscle tone. Coordination normal.  No ataxia on finger to nose bilaterally. No pronator drift. 5/5 strength throughout. CN 2-12 intact.Equal grip strength. Sensation intact.   Disoriented to month and year. Frustrated that she cannot remember certain things.  Skin: Skin is warm.  Psychiatric: She has a normal mood and affect. Her behavior is normal.  Nursing note and vitals reviewed.    ED Treatments / Results  Labs (all labs ordered are listed, but only abnormal results are  displayed) Labs Reviewed  CBC WITH DIFFERENTIAL/PLATELET - Abnormal; Notable for the following:       Result Value   RBC 2.66 (*)    Hemoglobin 9.5 (*)    HCT 28.7 (*)    MCV 107.9 (*)    MCH 35.7 (*)    All other components within normal limits  COMPREHENSIVE METABOLIC PANEL - Abnormal; Notable for the following:    CO2 21 (*)    Glucose, Bld 115 (*)    BUN 25 (*)    Creatinine, Ser 1.48 (*)    GFR calc non Af Amer 32 (*)    GFR calc Af Amer 37 (*)    All other components within normal limits  URINALYSIS, ROUTINE W REFLEX MICROSCOPIC (NOT AT [facility]) - Abnormal; Notable for the following:    Nitrite POSITIVE (*)    Leukocytes, UA LARGE (*)    All other components within normal limits  AMMONIA - Abnormal; Notable for the following:    Ammonia  67 (*)    All other components within normal limits  URINE MICROSCOPIC-ADD ON - Abnormal; Notable for the following:    Squamous Epithelial / LPF 0-5 (*)    Bacteria, UA MANY (*)    All other components within normal limits  TROPONIN I  RAPID URINE DRUG SCREEN, HOSP PERFORMED  TSH  AMMONIA    EKG  EKG Interpretation  Date/Time:  Saturday March 24 2016 07:40:19 EST Ventricular Rate:  45 PR Interval:    QRS Duration: 110 QT Interval:  685 QTC Calculation: 593 R Axis:   68 Text Interpretation:  Sinus rhythm Second deg AVB, Mobitz I (Wenckebach) Consider right atrial enlargement Left ventricular hypertrophy Prolonged QT interval with 2nd degree A-V block (Mobitz I) Confirmed by Wyvonnia Dusky  MD, Derion Kreiter [id number]) on 03/24/2016 8:08:57 AM       Radiology Dg Chest 2 View  Result Date: 03/24/2016 CLINICAL DATA:  Altered mental status EXAM: CHEST  2 VIEW COMPARISON:  None. FINDINGS: The heart size and mediastinal contours are within normal limits. Both lungs are clear. The visualized skeletal structures are unremarkable. IMPRESSION: No active cardiopulmonary disease. Electronically Signed   By: Inez Catalina M.D.   On: 03/24/2016 07:35    Ct Head Wo Contrast  Result Date: 03/24/2016 CLINICAL DATA:  81 year old female with altered mental status for 1 week. Initial encounter. EXAM: CT HEAD WITHOUT CONTRAST TECHNIQUE: Contiguous axial images were obtained from the base of the skull through the vertex without intravenous contrast. COMPARISON:  Head CT 02/08/2016 FINDINGS: Brain: Stable cerebral volume. Patchy bilateral cerebral white matter hypodensity appears stable. No midline shift, ventriculomegaly, mass effect, evidence of mass lesion, intracranial hemorrhage or evidence of cortically based acute infarction. No cortical encephalomalacia identified. Stable focal hypodensity in the right caudate nucleus. Vascular: Calcified atherosclerosis at the skull base. No suspicious intracranial vascular hyperdensity. Skull: No acute osseous abnormality identified. Sinuses/Orbits: Chronic right maxillary and sphenoid sinusitis are stable. Other Visualized paranasal sinuses and mastoids are stable and well pneumatized. Other: No acute orbit or scalp soft tissue finding. IMPRESSION: Stable noncontrast CT appearance of the brain since 02/08/2016 with cerebral white matter and right caudate nucleus hypodensity suggesting chronic small vessel disease. Electronically Signed   By: Genevie Ann M.D.   On: 03/24/2016 07:46    Procedures Procedures (including critical care time)  Medications Ordered in ED Medications - No data to display   Initial Impression / Assessment and Plan / ED Course  I have reviewed the triage vital signs and the nursing notes.  Pertinent labs & imaging results that were available during my care of the patient were reviewed by me and considered in my medical decision making (see chart for details).  Clinical Course   Patient with memory problems, anxiety and tearfulness. Admits to depression but denies suicidal thoughts. CD4 270 in 9/17. No fever or headache.   nonfocal exam. Disoriented to month and year. Recent memory seems  impaired but remote memory seems intact.  UA with infection. CT head stable.  Ammonia mildly elevated. No asterixis.   Uncertain cause of memory issues. Delirium from UTI? Possible transient global amnesia. EKG with Mobitz 1 block.  Patient asymptomatic.  Observation admission d/w Dr. Sarajane Jews.  Final Clinical Impressions(s) / ED Diagnoses   Final diagnoses:  Altered mental status, unspecified altered mental status type  Second degree heart block  Urinary tract infection without hematuria, site unspecified    New Prescriptions New Prescriptions   No medications on file  Ezequiel Essex, MD 03/24/16 [phone]

## 2016-03-24 NOTE — ED Notes (Signed)
Pt states she feels like she is losing her mind. Says it started more yesterday. Family says over the past week.

## 2016-03-24 NOTE — ED Notes (Signed)
Attempted iv assess x4 without success. EDP notified

## 2016-03-24 NOTE — H&P (Signed)
History and Physical  Karen Luna [id number] DOB: 06-26-1931 DOA: 03/24/2016  PCP: [name], MD  Patient coming from: home, lives alone  Chief Complaint: confused  HPI:  82 year old woman PMH HIV well controlled, Mobitz 1, presented to the emergency department with recurrent confusion. Initial evaluation revealed UTI and plans were made for observation.  History obtained from patient as well as sister and son at bedside with patient's permission. Patient was alone, has no dementia and is able to take care of herself. However, she had an episode of disorientation last evening which was quite disconcerting. This resolved spontaneously. This morning the patient woke up and was confused, disoriented but had the presence of mind to dial the number to call her sister, she was anxious and disoriented to time and situation. Therefore her sister involved the patient's son and the patient was brought to the emergency department for further evaluation. In general she's been in good health with no recent problems. Son does note she occasionally misses some of her medication. Review of systems is otherwise negative and they have no concerns about her home situation. At the present moment confusion seems improved although she is disoriented to month and year. No specific aggravating or alleviating factors noted.  ED Course: afebrile, VSS, no hypoxia. Treated with ceftriaxone. Pertinent labs: creatinine at baseline. BUN 25. Remainder CMP unremarkable. Ammonia 67. CBC unremarkable, stable Hgb 9.5. TSH WNL. RPR NR 12/2015. HIV RNA quant undetectable 12/2015. U/a grossly positive. UDS negative. EKG: Independently reviewed both EKGs this a.m. Sinus rhythm with Mobitz I. Imaging: CXR independently reviewed. No acute disease.  Review of Systems:  Negative for fever, new visual changes, sore throat, rash, new muscle aches, chest pain, SOB, dysuria, bleeding, n/v/abdominal pain.  Past Medical History:   Diagnosis Date  . Anemia of chronic disease 07/06/2011  . Aortic atherosclerosis ([place]) 01/06/2015   Seen on CT scan, currently asymptomatic  . Cancer ([place])    uterine( per pt)  . Chronic kidney disease, stage 3 09/01/2008  . Chronic venous insufficiency 10/03/2012  . Essential hypertension 09/19/2006  . Heart murmur    Aortic area  . History of cervical cancer 08/11/2012   s/p hysterectomy, remote, specifics unknown   . Human immunodeficiency virus (HIV) disease ([place]) 09/12/2006  . 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 07/25/2012  . Low grade squamous intraepithelial lesion (LGSIL) on cervical Pap smear 08/06/2007   LGSIL: 08/06/2007, 11/05/2008, 05/11/2009 ASC-US: 05/27/2008, 08/31/2009, 05/24/2010, 05/23/2012 High Risk HPV: Detected   . Onychomycosis of left great toe 09/09/2015  . Overweight (BMI 25.0-29.9) 07/25/2012    Past Surgical History:  Procedure Laterality Date  . ABDOMINAL HYSTERECTOMY       reports that she quit smoking about 27 years ago. Her smoking use included Cigarettes. She has never used smokeless tobacco. She reports that she does not drink alcohol or use drugs. Ambulatory    Allergies  Allergen Reactions  . Clonidine Derivatives Other (See Comments)    Non-specific heart block    Family History  Problem Relation Age of Onset  . Hypertension Mother   . Heart disease Mother   . Hypertension Father   . Heart disease Father   . Hypertension Sister   . Healthy Brother   . HIV/AIDS Daughter   . Healthy Son   . Healthy Sister   . Healthy Sister   . Healthy Sister   . Healthy Sister   . Early death Sister     Gun shot wound  .  Healthy Brother   . Healthy Son   . Cerebrovascular Accident Son      Prior to Admission medications   Medication Sig Start Date End Date Taking? Authorizing Provider  abacavir-dolutegravir-lamiVUDine (TRIUMEQ) 600-50-300 MG tablet Take 1 tablet by mouth daily. 06/03/15  Yes Thayer Headings, MD  amLODipine (NORVASC) 10 MG  tablet Take 1 tablet (10 mg total) by mouth daily. 05/27/15 05/26/16 Yes [name], MD  hydrALAZINE (APRESOLINE) 100 MG tablet Take 1 tablet (100 mg total) by mouth 3 (three) times daily. 09/09/15  Yes [name], MD  valsartan-hydrochlorothiazide (DIOVAN-HCT) 320-25 MG tablet Take 1 tablet by mouth daily. 05/27/15  Yes [name], MD    Physical Exam: Vitals:   03/24/16 0600 03/24/16 0748 03/24/16 0935 03/24/16 1037  BP: (!) 151/54 151/65 108/80 (!) 154/76  Pulse: 69 (!) 59 (!) 52 70  Resp: 14 18 14 16   Temp:   97.8 F (36.6 C) 98.5 F (36.9 C)  TempSrc:   Oral Oral  SpO2: 99% 100% 100% 100%  Weight:    67.2 kg (148 lb 3.2 oz)  Height:    5\' 9"  (1.753 m)   Constitutional:  . Appears calm and comfortable Eyes:  . PERRL and irises appear normal . Normal conjunctivae and lids ENMT:  . external ears, nose appear normal . grossly normal hearing Neck:  . neck appears normal, no masses . no thyromegaly Respiratory:  . CTA bilaterally, no w/r/r.  . Respiratory effort normal. No retractions or accessory muscle use Cardiovascular:  . RRR, no m/r/g . No LE extremity edema   Abdomen:  . Abdomen appears normal; no tenderness or masses Musculoskeletal:  . Digits/nails both hands: no clubbing, cyanosis, petechiae, infection . RUE, LUE, RLE, LLE   o strength and tone normal, no atrophy, no abnormal movements o No tenderness, masses Skin:  . No rashes, lesions, ulcers . palpation of skin: no induration or nodules Neurologic:  . Sensation all 4 extremities intact Psychiatric:  . judgement and insight appear normal . Mental status o Orientation to person, place, not month or year o Mood, affect appropriate  Wt Readings from Last 3 Encounters:  03/24/16 67.2 kg (148 lb 3.2 oz)  01/10/16 74.4 kg (164 lb)  01/05/16 73.2 kg (161 lb 4.8 oz)    I have personally reviewed following labs and imaging studies  Labs on Admission:  CBC:  Recent Labs Lab 03/24/16 0512    WBC 5.9  NEUTROABS 4.4  HGB 9.5*  HCT 28.7*  MCV 107.9*  PLT [id number]   Basic Metabolic Panel:  Recent Labs Lab 03/24/16 0512  NA 138  K 4.4  CL 108  CO2 21*  GLUCOSE 115*  BUN 25*  CREATININE 1.48*  CALCIUM 10.3   Liver Function Tests:  Recent Labs Lab 03/24/16 0512  AST 31  ALT 17  ALKPHOS 54  BILITOT 0.6  PROT 8.1  ALBUMIN 4.4    Recent Labs Lab 03/24/16 0550  AMMONIA 67*   Cardiac Enzymes:  Recent Labs Lab 03/24/16 0512  TROPONINI <0.03     Recent Labs  03/24/16 0550  TSH 1.846   Urine analysis:    Component Value Date/Time   COLORURINE YELLOW 03/24/2016 [place] 03/24/2016 0454   LABSPEC 1.010 03/24/2016 0454   PHURINE 5.5 03/24/2016 [place] 03/24/2016 0454   GLUCOSEU NEG mg/dL 09/12/2006 0000   HGBUR NEGATIVE 03/24/2016 [place] 03/24/2016 [place] 03/24/2016  [place] 03/24/2016 0454   UROBILINOGEN 0.2 07/23/2013 1431   NITRITE POSITIVE (A) 03/24/2016 0454   LEUKOCYTESUR LARGE (A) 03/24/2016 0454   Radiological Exams on Admission: Dg Chest 2 View  Result Date: 03/24/2016 CLINICAL DATA:  Altered mental status EXAM: CHEST  2 VIEW COMPARISON:  None. FINDINGS: The heart size and mediastinal contours are within normal limits. Both lungs are clear. The visualized skeletal structures are unremarkable. IMPRESSION: No active cardiopulmonary disease. Electronically Signed   By: Inez Catalina M.D.   On: 03/24/2016 07:35   Ct Head Wo Contrast  Result Date: 03/24/2016 CLINICAL DATA:  82 year old female with altered mental status for 1 week. Initial encounter. EXAM: CT HEAD WITHOUT CONTRAST TECHNIQUE: Contiguous axial images were obtained from the base of the skull through the vertex without intravenous contrast. COMPARISON:  Head CT 02/08/2016 FINDINGS: Brain: Stable cerebral volume. Patchy bilateral cerebral white matter hypodensity appears stable. No midline  shift, ventriculomegaly, mass effect, evidence of mass lesion, intracranial hemorrhage or evidence of cortically based acute infarction. No cortical encephalomalacia identified. Stable focal hypodensity in the right caudate nucleus. Vascular: Calcified atherosclerosis at the skull base. No suspicious intracranial vascular hyperdensity. Skull: No acute osseous abnormality identified. Sinuses/Orbits: Chronic right maxillary and sphenoid sinusitis are stable. Other Visualized paranasal sinuses and mastoids are stable and well pneumatized. Other: No acute orbit or scalp soft tissue finding. IMPRESSION: Stable noncontrast CT appearance of the brain since 02/08/2016 with cerebral white matter and right caudate nucleus hypodensity suggesting chronic small vessel disease. Electronically Signed   By: Genevie Ann M.D.   On: 03/24/2016 07:46     Principal Problem:   Acute encephalopathy Active Problems:   Human immunodeficiency virus (HIV) disease (HCC)   Chronic kidney disease, stage 3   Aortic atherosclerosis ([facility])   UTI (urinary tract infection)   Hyperammonemia (HCC)   Mobitz I   Assessment/Plan 1. Acute encephalopathy, likely secondary to UTI. Improved at present moment. No focal neurologic deficits, CT head unremarkable. No metabolic abnormalities noted other than elevated ammonia which is of unclear significance. She does not have any features to suggest hepatic encephalopathy. 2. UTI, empiric therapy, urine culture. 3. Elevated ammonia. Triumeq can cause hepatomegaly and steatosis. However, LFTs WNL. No abd imaging on file. She has no asterixis or signs or symptoms to suggest hepatic encephalopathy. Suspect this is clinically insignificant. 4. Chronic Mobitz I with bradycardia. "Acquired Heart Block" seen office 12/29/2014, EKG reported as SR with nonconducted PAC and occasional junctional escapes attributed to clonidine. also  per that note had Mobitz I on Holter 2011. Family reports she had an episode  last year in [place] of arrhythmia as well. Completely asymptomatic at this time. 5. HIV. CD4 270 12/2015, HIV RNA quant undetectable 12/2015 6. CKD stage III at baseline. 7. Anemia of chronic disease, baseline 8. Aortic atherosclerosis    Appears medically stable for observation on the medical floor.  Monitor for recurrent encephalopathy or worsening of condition  Empiric antibiotics, follow-up urine culture  Repeat serum ammonia in the morning. Consider outpatient imaging of the liver, follow-up with primary care physician.  No signs or symptoms to suggest arrhythmia as a causative factor. Will monitor on telemetry today.  At this time would project discharge 11/26  DVT prophylaxis: SCDs Code Status: full code desired Family Communication: son, sister at bedside Disposition Plan: home   Consults called: none     It is my clinical opinion that referral for OBSERVATION is reasonable and necessary  in this patient  . presenting with symptoms of confusion, concerning for UTI with encephalopathy . in the context of PMH including HIV . with pertinent physical exam findings of disoriented to month, year . and pertinent radiographic and laboratory data including abnormal u/a elevated ammonia.  The aforementioned taken together are felt to place the patient at high risk for further clinical deterioration. However it is anticipated that the patient may be medically stable for discharge from the hospital within 24 to 48 hours.   Time spent: 55 minutes  [name], MD  [facility] Direct contact: [phone] --Via [place]  --[email or web address]; password [email or web address]  7PM-7AM contact night coverage as above  03/24/2016, 10:40 AM

## 2016-03-25 DIAGNOSIS — B20 Human immunodeficiency virus [HIV] disease: Secondary | ICD-10-CM | POA: Diagnosis not present

## 2016-03-25 DIAGNOSIS — G9340 Encephalopathy, unspecified: Secondary | ICD-10-CM | POA: Diagnosis not present

## 2016-03-25 DIAGNOSIS — N183 Chronic kidney disease, stage 3 (moderate): Secondary | ICD-10-CM | POA: Diagnosis not present

## 2016-03-25 DIAGNOSIS — I441 Atrioventricular block, second degree: Secondary | ICD-10-CM | POA: Diagnosis not present

## 2016-03-25 DIAGNOSIS — I700 Atherosclerosis of aorta: Secondary | ICD-10-CM | POA: Diagnosis not present

## 2016-03-25 DIAGNOSIS — N3000 Acute cystitis without hematuria: Secondary | ICD-10-CM | POA: Diagnosis not present

## 2016-03-25 LAB — AMMONIA: AMMONIA: 21 umol/L (ref 9–35)

## 2016-03-25 MED ORDER — CEFUROXIME AXETIL 250 MG PO TABS: 500.0000 mg | ORAL_TABLET | Freq: Two times a day (BID) | ORAL | Status: DC

## 2016-03-25 MED ORDER — CEFUROXIME AXETIL 500 MG PO TABS: 500.0000 mg | ORAL_TABLET | Freq: Two times a day (BID) | ORAL | 0 refills | Status: DC

## 2016-03-25 NOTE — Discharge Summary (Signed)
PROGRESS NOTE  Karen Luna [id number] DOB: 1931/08/04 DOA: 03/24/2016 PCP: [name], MD  Brief Narrative: 82 year old woman PMH HIV well controlled, Mobitz 1, presented to the emergency department with recurrent confusion. Initial evaluation revealed UTI and plans were made for observation.  Assessment/Plan: 1. Acute encephalopathy, secondary to UTI; resolved.  2. UTI, improving clinically. Afebrile, VSS. 3. Elevated ammonia, resolved, likely spurious. Would not pursue further evaluation at this point. 4. Chronic Mobitz I with bradycardia. "Acquired Heart Block" seen office 12/29/2014, EKG reported as SR with nonconducted PAC and occasional junctional escapes attributed to clonidine. also  per that note had Mobitz I on Holter 2011. Family reports she had an episode last year in [place] of arrhythmia as well.  5. HIV. CD4 270 12/2015, HIV RNA quant undetectable 12/2015 6. CKD stage III at baseline. 7. Anemia of chronic disease, baseline 8. Aortic atherosclerosis    Much improved. Change to oral abx and plan discharge home today. Urine culture was not sent.  [name], MD  [facility] Direct contact: [phone] --Via amion app [place]  --[email or web address]; password [email or web address]  7PM-7AM contact night coverage as above 03/25/2016, 12:20 PM  LOS: 0 days   Consultants:    Procedures:    Antimicrobials:  Ceftriaxone 11/25 >> 11/26  Ceftin 11/27 >> 11/29  Interval history/Subjective: Feeling fine. No pain. No complaints.  Objective: Vitals:   03/24/16 1037 03/24/16 1428 03/24/16 1959 03/25/16 0420  BP: (!) 154/76 (!) 132/50 (!) 137/53 (!) 144/44  Pulse: 70 (!) 56 67 60  Resp: 16 16 15 18   Temp: 98.5 F (36.9 C) 98.5 F (36.9 C) 98.7 F (37.1 C) 97.4 F (36.3 C)  TempSrc: Oral Oral Oral Oral  SpO2: 100% 100% 99% 99%  Weight: 67.2 kg (148 lb 3.2 oz)     Height: 5\' 9"  (1.753 m)       Intake/Output Summary (Last 24 hours) at 03/25/16 1220 Last data  filed at 03/25/16 0900  Gross per 24 hour  Intake              240 ml  Output             1250 ml  Net            -1010 ml     Filed Weights   03/24/16 0424 03/24/16 1037  Weight: 68 kg (150 lb) 67.2 kg (148 lb 3.2 oz)    Exam: Constitutional:   Appears calm, comfortable. Well-appearing. Respiratory:   CTA bilaterally no wheezes, rales or rhonchi.  Normal respiratory effort Cardiovascular:   RRR  Tele SR/SB with intermittent Mobitz I Abdomen:   Soft, ntnd Psychiatric:   Alert, speech fluent and clear  Oriented to self, location, not month or year    I have personally reviewed following labs and imaging studies:  UOP 1130  Ammonia 21  UDS negative  TSH WNL  Scheduled Meds: . abacavir-dolutegravir-lamiVUDine  1 tablet Oral Daily  . amLODipine  10 mg Oral Daily  . cefTRIAXone (ROCEPHIN)  IV  1 g Intravenous Q24H  . hydrALAZINE  100 mg Oral TID  . irbesartan  300 mg Oral Daily   And  . hydrochlorothiazide  25 mg Oral Daily  . sodium chloride flush  3 mL Intravenous Q12H  . sodium chloride flush  3 mL Intravenous Q12H   Continuous Infusions:  Principal Problem:   Acute encephalopathy Active Problems:   Human immunodeficiency virus (HIV) disease (HCC)   Chronic kidney disease, stage  3   Aortic atherosclerosis (HCC)   UTI (urinary tract infection)   Hyperammonemia (HCC)   Mobitz I   LOS: 0 days

## 2016-03-25 NOTE — Discharge Summary (Signed)
Physician Discharge Summary  Karen Luna [id number] DOB: May 07, 1931 DOA: 03/24/2016  PCP: [name], MD  Admit date: 03/24/2016 Discharge date: 03/25/2016  Recommendations for Outpatient Follow-up:  1. Resolution of UTI 2. Aortic atherosclerosis if clinically indicated  Follow-up Information    [name], MD. Schedule an appointment as soon as possible for a visit in 1 week(s).   Specialty:  Internal Medicine Contact information: [address] [address] [phone]        Scharlene Gloss, MD .   Specialty:  Infectious Diseases Contact information: [address] [address] [phone]          Discharge Diagnoses:  1. Acute encephalopathy 2. UTI 3. Chronic Mobitz I with bradycardia 4. HIV 5. CKD stage III 6. Anemia of chronic disease 7. Aortic atherosclerosis  Discharge Condition: improved Disposition: home  Diet recommendation: heart healthy  Filed Weights   03/24/16 0424 03/24/16 1037  Weight: 68 kg (150 lb) 67.2 kg (148 lb 3.2 oz)    History of present illness:  82 year old woman PMH HIV well controlled, Mobitz 1, presented to the emergency department with recurrent confusion. Initial evaluation revealed UTI and plans were made for observation.  Hospital Course:  Observed overnight, no new issues. Improved with abx, hospitalization was uncomplicated. Individual issues as below.  1. Acute encephalopathy, secondary to UTI; resolved.  2. UTI, improving clinically. Afebrile, VSS. 3. Elevated ammonia, resolved, likely spurious. Would not pursue further evaluation at this point. 4. Chronic Mobitz I with bradycardia. "Acquired Heart Block" seen office 12/29/2014, EKG reported as SR with nonconducted PAC and occasional junctional escapes attributed to clonidine. also per that note had Mobitz I on Holter 2011. Family reports she had an episode last year in [place] of arrhythmia as well.  5. HIV. CD4  270 12/2015, HIV RNA quant undetectable 12/2015 6. CKD stage III at baseline. 7. Anemia of chronic disease, baseline 8. Aortic atherosclerosis   Antimicrobials:  Ceftriaxone 11/25 >> 11/26  Ceftin 11/27 >> 11/29  Discharge Instructions  Discharge Instructions    Diet - low sodium heart healthy    Complete by:  As directed    Discharge instructions    Complete by:  As directed    Call your physician or seek immediate medical attention for fever, confusion, falls, pain, shortness of breath or worsening of condition.   Increase activity slowly    Complete by:  As directed        Medication List    TAKE these medications   abacavir-dolutegravir-lamiVUDine 600-50-300 MG tablet Commonly known as:  TRIUMEQ Take 1 tablet by mouth daily.   amLODipine 10 MG tablet Commonly known as:  NORVASC Take 1 tablet (10 mg total) by mouth daily.   cefUROXime 500 MG tablet Commonly known as:  CEFTIN Take 1 tablet (500 mg total) by mouth 2 (two) times daily with a meal. Start taking on:  03/26/2016   hydrALAZINE 100 MG tablet Commonly known as:  APRESOLINE Take 1 tablet (100 mg total) by mouth 3 (three) times daily.   valsartan-hydrochlorothiazide 320-25 MG tablet Commonly known as:  DIOVAN-HCT Take 1 tablet by mouth daily.      Allergies  Allergen Reactions  . Clonidine Derivatives Other (See Comments)    Non-specific heart block    The results of significant diagnostics from this hospitalization (including imaging, microbiology, ancillary and laboratory) are listed below for reference.    Significant Diagnostic Studies: Dg Chest 2 View  Result Date: 03/24/2016 CLINICAL DATA:  Altered mental status EXAM: CHEST  2 VIEW COMPARISON:  None. FINDINGS: The heart size and mediastinal contours are within normal limits. Both lungs are clear. The visualized skeletal structures are unremarkable. IMPRESSION: No active cardiopulmonary disease. Electronically Signed   By: Inez Catalina M.D.    On: 03/24/2016 07:35   Ct Head Wo Contrast  Result Date: 03/24/2016 CLINICAL DATA:  82 year old female with altered mental status for 1 week. Initial encounter. EXAM: CT HEAD WITHOUT CONTRAST TECHNIQUE: Contiguous axial images were obtained from the base of the skull through the vertex without intravenous contrast. COMPARISON:  Head CT 02/08/2016 FINDINGS: Brain: Stable cerebral volume. Patchy bilateral cerebral white matter hypodensity appears stable. No midline shift, ventriculomegaly, mass effect, evidence of mass lesion, intracranial hemorrhage or evidence of cortically based acute infarction. No cortical encephalomalacia identified. Stable focal hypodensity in the right caudate nucleus. Vascular: Calcified atherosclerosis at the skull base. No suspicious intracranial vascular hyperdensity. Skull: No acute osseous abnormality identified. Sinuses/Orbits: Chronic right maxillary and sphenoid sinusitis are stable. Other Visualized paranasal sinuses and mastoids are stable and well pneumatized. Other: No acute orbit or scalp soft tissue finding. IMPRESSION: Stable noncontrast CT appearance of the brain since 02/08/2016 with cerebral white matter and right caudate nucleus hypodensity suggesting chronic small vessel disease. Electronically Signed   By: Genevie Ann M.D.   On: 03/24/2016 07:46    Labs: Basic Metabolic Panel:  Recent Labs Lab 03/24/16 0512  NA 138  K 4.4  CL 108  CO2 21*  GLUCOSE 115*  BUN 25*  CREATININE 1.48*  CALCIUM 10.3   Liver Function Tests:  Recent Labs Lab 03/24/16 0512  AST 31  ALT 17  ALKPHOS 54  BILITOT 0.6  PROT 8.1  ALBUMIN 4.4    Recent Labs Lab 03/24/16 0550 03/25/16 0634  AMMONIA 67* 21   CBC:  Recent Labs Lab 03/24/16 0512  WBC 5.9  NEUTROABS 4.4  HGB 9.5*  HCT 28.7*  MCV 107.9*  PLT 393   Cardiac Enzymes:  Recent Labs Lab 03/24/16 0512  TROPONINI <0.03    Principal Problem:   Acute encephalopathy Active Problems:   Human  immunodeficiency virus (HIV) disease ([place])   Chronic kidney disease, stage 3   Aortic atherosclerosis ([facility])   UTI (urinary tract infection)   Hyperammonemia (HCC)   Mobitz I   Time coordinating discharge: 25 minutes  Signed:  [name], MD [facility] 03/25/2016, 12:44 PM

## 2016-03-25 NOTE — Progress Notes (Signed)
Patient discharged home with son. IV removed and site intact. Patient discharged home with all personal belongings and prescriptions.

## 2016-03-30 ENCOUNTER — Ambulatory Visit: Payer: Self-pay

## 2016-04-08 ENCOUNTER — Encounter (HOSPITAL_COMMUNITY): Payer: Self-pay | Admitting: *Deleted

## 2016-04-08 ENCOUNTER — Inpatient Hospital Stay (HOSPITAL_COMMUNITY)
Admission: EM | Admit: 2016-04-08 | Discharge: 2016-04-12 | DRG: 689 | Disposition: A | Discharge status: Home / Self Care | Payer: Commercial Managed Care - HMO | Attending: Internal Medicine | Admitting: Internal Medicine

## 2016-04-08 DIAGNOSIS — R55 Syncope and collapse: Secondary | ICD-10-CM | POA: Diagnosis not present

## 2016-04-08 DIAGNOSIS — G9341 Metabolic encephalopathy: Secondary | ICD-10-CM | POA: Diagnosis not present

## 2016-04-08 DIAGNOSIS — Z888 Allergy status to other drugs, medicaments and biological substances status: Secondary | ICD-10-CM

## 2016-04-08 DIAGNOSIS — R4182 Altered mental status, unspecified: Secondary | ICD-10-CM | POA: Diagnosis not present

## 2016-04-08 DIAGNOSIS — D638 Anemia in other chronic diseases classified elsewhere: Secondary | ICD-10-CM | POA: Diagnosis present

## 2016-04-08 DIAGNOSIS — N390 Urinary tract infection, site not specified: Secondary | ICD-10-CM | POA: Diagnosis not present

## 2016-04-08 DIAGNOSIS — Z87891 Personal history of nicotine dependence: Secondary | ICD-10-CM | POA: Diagnosis not present

## 2016-04-08 DIAGNOSIS — N183 Chronic kidney disease, stage 3 unspecified: Secondary | ICD-10-CM | POA: Diagnosis present

## 2016-04-08 DIAGNOSIS — Z90710 Acquired absence of both cervix and uterus: Secondary | ICD-10-CM

## 2016-04-08 DIAGNOSIS — R2689 Other abnormalities of gait and mobility: Secondary | ICD-10-CM

## 2016-04-08 DIAGNOSIS — I700 Atherosclerosis of aorta: Secondary | ICD-10-CM | POA: Diagnosis not present

## 2016-04-08 DIAGNOSIS — Z830 Family history of human immunodeficiency virus [HIV] disease: Secondary | ICD-10-CM | POA: Diagnosis not present

## 2016-04-08 DIAGNOSIS — G9340 Encephalopathy, unspecified: Secondary | ICD-10-CM | POA: Diagnosis not present

## 2016-04-08 DIAGNOSIS — N3000 Acute cystitis without hematuria: Principal | ICD-10-CM | POA: Diagnosis present

## 2016-04-08 DIAGNOSIS — N1832 Chronic kidney disease, stage 3b: Secondary | ICD-10-CM | POA: Diagnosis present

## 2016-04-08 DIAGNOSIS — R410 Disorientation, unspecified: Secondary | ICD-10-CM

## 2016-04-08 DIAGNOSIS — IMO0002 Reserved for concepts with insufficient information to code with codable children: Secondary | ICD-10-CM

## 2016-04-08 DIAGNOSIS — Z8541 Personal history of malignant neoplasm of cervix uteri: Secondary | ICD-10-CM | POA: Diagnosis not present

## 2016-04-08 DIAGNOSIS — S0990XA Unspecified injury of head, initial encounter: Secondary | ICD-10-CM | POA: Diagnosis not present

## 2016-04-08 DIAGNOSIS — Z8249 Family history of ischemic heart disease and other diseases of the circulatory system: Secondary | ICD-10-CM

## 2016-04-08 DIAGNOSIS — B20 Human immunodeficiency virus [HIV] disease: Secondary | ICD-10-CM | POA: Diagnosis present

## 2016-04-08 DIAGNOSIS — Z823 Family history of stroke: Secondary | ICD-10-CM

## 2016-04-08 DIAGNOSIS — I1310 Hypertensive heart and chronic kidney disease without heart failure, with stage 1 through stage 4 chronic kidney disease, or unspecified chronic kidney disease: Secondary | ICD-10-CM | POA: Diagnosis present

## 2016-04-08 DIAGNOSIS — R29898 Other symptoms and signs involving the musculoskeletal system: Secondary | ICD-10-CM

## 2016-04-08 LAB — URINALYSIS, ROUTINE W REFLEX MICROSCOPIC
Bilirubin Urine: NEGATIVE
GLUCOSE, UA: NEGATIVE mg/dL
Hgb urine dipstick: NEGATIVE
KETONES UR: NEGATIVE mg/dL
Nitrite: NEGATIVE
PROTEIN: NEGATIVE mg/dL
Specific Gravity, Urine: 1.01 (ref 1.005–1.030)
pH: 5 (ref 5.0–8.0)

## 2016-04-08 LAB — IRON AND TIBC
Iron: 19 ug/dL — ABNORMAL LOW (ref 28–170)
Saturation Ratios: 6 % — ABNORMAL LOW (ref 10.4–31.8)
TIBC: 293 ug/dL (ref 250–450)
UIBC: 274 ug/dL

## 2016-04-08 LAB — COMPREHENSIVE METABOLIC PANEL
ALBUMIN: 4.3 g/dL (ref 3.5–5.0)
ALT: 15 U/L (ref 14–54)
AST: 22 U/L (ref 15–41)
Alkaline Phosphatase: 53 U/L (ref 38–126)
Anion gap: 9 (ref 5–15)
BUN: 26 mg/dL — AB (ref 6–20)
CO2: 22 mmol/L (ref 22–32)
CREATININE: 1.57 mg/dL — AB (ref 0.44–1.00)
Calcium: 10.5 mg/dL — ABNORMAL HIGH (ref 8.9–10.3)
Chloride: 109 mmol/L (ref 101–111)
GFR calc Af Amer: 34 mL/min — ABNORMAL LOW (ref >60)
GFR, EST NON AFRICAN AMERICAN: 30 mL/min — AB (ref >60)
GLUCOSE: 113 mg/dL — AB (ref 65–99)
POTASSIUM: 3.8 mmol/L (ref 3.5–5.1)
Sodium: 140 mmol/L (ref 135–145)
Total Bilirubin: 0.3 mg/dL (ref 0.3–1.2)
Total Protein: 8.1 g/dL (ref 6.5–8.1)

## 2016-04-08 LAB — FERRITIN: FERRITIN: 186 ng/mL (ref 11–307)

## 2016-04-08 LAB — CBC WITH DIFFERENTIAL/PLATELET
Basophils Absolute: 0 10*3/uL (ref 0.0–0.1)
Basophils Relative: 0 %
EOS PCT: 1 %
Eosinophils Absolute: 0.1 10*3/uL (ref 0.0–0.7)
HCT: 27.7 % — ABNORMAL LOW (ref 36.0–46.0)
Hemoglobin: 9.3 g/dL — ABNORMAL LOW (ref 12.0–15.0)
LYMPHS ABS: 1.3 10*3/uL (ref 0.7–4.0)
LYMPHS PCT: 17 %
MCH: 35.9 pg — AB (ref 26.0–34.0)
MCHC: 33.6 g/dL (ref 30.0–36.0)
MCV: 106.9 fL — AB (ref 78.0–100.0)
MONO ABS: 0.8 10*3/uL (ref 0.1–1.0)
Monocytes Relative: 10 %
Neutro Abs: 5.7 10*3/uL (ref 1.7–7.7)
Neutrophils Relative %: 72 %
PLATELETS: 411 10*3/uL — AB (ref 150–400)
RBC: 2.59 MIL/uL — AB (ref 3.87–5.11)
RDW: 14.8 % (ref 11.5–15.5)
WBC: 7.9 10*3/uL (ref 4.0–10.5)

## 2016-04-08 LAB — AMMONIA: AMMONIA: 28 umol/L (ref 9–35)

## 2016-04-08 LAB — RETICULOCYTES
RBC.: 2.56 MIL/uL — AB (ref 3.87–5.11)
RETIC COUNT ABSOLUTE: 33.3 10*3/uL (ref 19.0–186.0)
RETIC CT PCT: 1.3 % (ref 0.4–3.1)

## 2016-04-08 LAB — FOLATE: Folate: 13.3 ng/mL (ref >5.9)

## 2016-04-08 LAB — VITAMIN B12: Vitamin B-12: 226 pg/mL (ref 180–914)

## 2016-04-08 MED ORDER — DEXTROSE 5 % IV SOLN
1.0000 g | Freq: Once | INTRAVENOUS | Status: AC
  Administered 2016-04-08: 1 g via INTRAVENOUS
  Filled 2016-04-08: qty 10

## 2016-04-08 MED ORDER — CEFTRIAXONE SODIUM 1 G IJ SOLR
1.0000 g | INTRAMUSCULAR | Status: DC
  Administered 2016-04-09 – 2016-04-12 (×4): 1 g via INTRAVENOUS
  Filled 2016-04-08 (×5): qty 10

## 2016-04-08 MED ORDER — ONDANSETRON HCL 4 MG/2ML IJ SOLN
4.0000 mg | Freq: Four times a day (QID) | INTRAMUSCULAR | Status: DC | PRN
  Administered 2016-04-10: 4 mg via INTRAVENOUS
  Filled 2016-04-08 (×2): qty 2

## 2016-04-08 MED ORDER — POLYETHYLENE GLYCOL 3350 17 G PO PACK: 17.0000 g | PACK | Freq: Every day | ORAL | Status: DC | PRN

## 2016-04-08 MED ORDER — VALSARTAN-HYDROCHLOROTHIAZIDE 320-25 MG PO TABS: 1.0000 | ORAL_TABLET | Freq: Every day | ORAL | Status: DC

## 2016-04-08 MED ORDER — ONDANSETRON HCL 4 MG PO TABS: 4.0000 mg | ORAL_TABLET | Freq: Four times a day (QID) | ORAL | Status: DC | PRN

## 2016-04-08 MED ORDER — HYDRALAZINE HCL 25 MG PO TABS
100.0000 mg | ORAL_TABLET | Freq: Three times a day (TID) | ORAL | Status: DC
  Administered 2016-04-08 – 2016-04-12 (×13): 100 mg via ORAL
  Filled 2016-04-08 (×24): qty 4

## 2016-04-08 MED ORDER — SODIUM CHLORIDE 0.9 % IV SOLN
INTRAVENOUS | Status: DC
  Administered 2016-04-08: 21:00:00 via INTRAVENOUS
  Administered 2016-04-08: 1000 mL via INTRAVENOUS
  Administered 2016-04-09: 07:00:00 via INTRAVENOUS

## 2016-04-08 MED ORDER — IRBESARTAN 300 MG PO TABS
300.0000 mg | ORAL_TABLET | Freq: Every day | ORAL | Status: DC
  Administered 2016-04-08 – 2016-04-12 (×5): 300 mg via ORAL
  Filled 2016-04-08 (×5): qty 1

## 2016-04-08 MED ORDER — ENOXAPARIN SODIUM 30 MG/0.3ML ~~LOC~~ SOLN
30.0000 mg | SUBCUTANEOUS | Status: DC
  Administered 2016-04-08 – 2016-04-12 (×5): 30 mg via SUBCUTANEOUS
  Filled 2016-04-08 (×5): qty 0.3

## 2016-04-08 MED ORDER — HYDROCHLOROTHIAZIDE 25 MG PO TABS: 25.0000 mg | ORAL_TABLET | Freq: Every day | ORAL | Status: DC

## 2016-04-08 MED ORDER — ACETAMINOPHEN 650 MG RE SUPP: 650.0000 mg | Freq: Four times a day (QID) | RECTAL | Status: DC | PRN

## 2016-04-08 MED ORDER — ACETAMINOPHEN 325 MG PO TABS
650.0000 mg | ORAL_TABLET | Freq: Four times a day (QID) | ORAL | Status: DC | PRN
  Administered 2016-04-10 (×3): 650 mg via ORAL
  Filled 2016-04-08 (×3): qty 2

## 2016-04-08 MED ORDER — AMLODIPINE BESYLATE 5 MG PO TABS
10.0000 mg | ORAL_TABLET | Freq: Every day | ORAL | Status: DC
  Administered 2016-04-08 – 2016-04-12 (×5): 10 mg via ORAL
  Filled 2016-04-08 (×5): qty 2

## 2016-04-08 MED ORDER — ABACAVIR-DOLUTEGRAVIR-LAMIVUD 600-50-300 MG PO TABS
1.0000 | ORAL_TABLET | Freq: Every day | ORAL | Status: DC
  Administered 2016-04-08 – 2016-04-12 (×5): 1 via ORAL
  Filled 2016-04-08 (×9): qty 1

## 2016-04-08 NOTE — H&P (Signed)
History and Physical    Karen Luna [id number] DOB: February 26, 1932 DOA: 04/08/2016  PCP: [name], MD  Karen Luna coming from:home  Chief Complaint:confusion  HPI: Karen Luna is a 82 y.o. female with medical history significant of HIV on day viral medications, hypertension, chronic kidney disease is stage III, recently admitted for confusion when Karen Luna was discharged with Ceftin for the treatment of UTI probably in by Karen Luna's son for the evaluation of confusion. As per ER, today morning around 3 AM Karen Luna called her son when she did not know about date and was confused. Karen Luna recently had CT scan of head which was unremarkable. During my physical examination, Karen Luna was allowed awake and oriented to hospital and name only. No focal neurological deficit. Karen Luna's sister at bedside. Karen Luna denied fever, chills, headache, chest pain, shortness of breath, nausea, vomiting, abdominal pain, diarrhea, constipation. Review of system many unreliable because of her confusion.  ED Course: Karen Luna was found to have UTI. The cultures were sent. Treated with ceftriaxone and admitted for further evaluation.  Review of Systems: As per HPI otherwise 10 point review of systems negative. Review of system many unreliable because of Karen Luna's confusion.   Past Medical History:  Diagnosis Date  . Anemia of chronic disease 07/06/2011  . Aortic atherosclerosis ([place]) 01/06/2015   Seen on CT scan, currently asymptomatic  . Cancer ([place])    uterine( per pt)  . Chronic kidney disease, stage 3 09/01/2008  . Chronic venous insufficiency 10/03/2012  . Essential hypertension 09/19/2006  . Heart murmur    Aortic area  . History of cervical cancer 08/11/2012   s/p hysterectomy, remote, specifics unknown   . Human immunodeficiency virus (HIV) disease ([place]) 09/12/2006  . 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 07/25/2012  . Low grade squamous intraepithelial lesion (LGSIL) on cervical  Pap smear 08/06/2007   LGSIL: 08/06/2007, 11/05/2008, 05/11/2009 ASC-US: 05/27/2008, 08/31/2009, 05/24/2010, 05/23/2012 High Risk HPV: Detected   . Onychomycosis of left great toe 09/09/2015  . Overweight (BMI 25.0-29.9) 07/25/2012    Past Surgical History:  Procedure Laterality Date  . ABDOMINAL HYSTERECTOMY       Social history: reports that she quit smoking about 27 years ago. Her smoking use included Cigarettes. She has never used smokeless tobacco. She reports that she does not drink alcohol or use drugs.  Allergies  Allergen Reactions  . Clonidine Derivatives Other (See Comments)    Non-specific heart block    Family History  Problem Relation Age of Onset  . Hypertension Mother   . Heart disease Mother   . Hypertension Father   . Heart disease Father   . Hypertension Sister   . Healthy Brother   . HIV/AIDS Daughter   . Healthy Son   . Healthy Sister   . Healthy Sister   . Healthy Sister   . Healthy Sister   . Early death Sister     Gun shot wound  . Healthy Brother   . Healthy Son   . Cerebrovascular Accident Son      Prior to Admission medications   Medication Sig Start Date End Date Taking? Authorizing Provider  abacavir-dolutegravir-lamiVUDine (TRIUMEQ) 600-50-300 MG tablet Take 1 tablet by mouth daily. 06/03/15  Yes Thayer Headings, MD  amLODipine (NORVASC) 10 MG tablet Take 1 tablet (10 mg total) by mouth daily. 05/27/15 05/26/16 Yes [name], MD  hydrALAZINE (APRESOLINE) 100 MG tablet Take 1 tablet (100 mg total) by mouth 3 (three) times daily. 09/09/15  Yes  [name], MD  valsartan-hydrochlorothiazide (DIOVAN-HCT) 320-25 MG tablet Take 1 tablet by mouth daily. 05/27/15  Yes [name], MD    Physical Exam: Vitals:   04/08/16 0700 04/08/16 0730 04/08/16 0800 04/08/16 0958  BP: 153/74 169/83 163/73 (!) 166/62  Pulse: 65 66 71 70  Resp:    18  Temp:    97.5 F (36.4 C)  TempSrc:    Oral  SpO2: 100% 100% 99%   Weight:    66.2 kg (146 lb)  Height:    5'  9" (1.753 m)      Constitutional: NAD, calm, comfortable Vitals:   04/08/16 0700 04/08/16 0730 04/08/16 0800 04/08/16 0958  BP: 153/74 169/83 163/73 (!) 166/62  Pulse: 65 66 71 70  Resp:    18  Temp:    97.5 F (36.4 C)  TempSrc:    Oral  SpO2: 100% 100% 99%   Weight:    66.2 kg (146 lb)  Height:    5\' 9"  (1.753 m)   Eyes: PERRL, lids and conjunctivae normal ENMT: Mucous membranes are moist. Posterior pharynx clear of any exudate or lesions.  Neck: normal, supple, no masses, no thyromegaly Respiratory: clear to auscultation bilaterally, no wheezing, no crackles. Normal respiratory effort. No accessory muscle use.  Cardiovascular: Regular rate and rhythm, Has systolic murmur murmurs. No extremity edema. 2+ pedal pulses. No carotid bruits.  Abdomen: no tenderness, no masses palpated. No hepatosplenomegaly. Bowel sounds positive.  Musculoskeletal: no clubbing / cyanosis. No joint deformity upper and lower extremities. Good ROM, no contractures. Normal muscle tone.  Skin: no rashes, lesions, ulcers. No induration Neurologic: Alert, awake, following simple commands, Strength 5/5 in all 4.  Psychiatric: Impaired judgment and insight. Alert and oriented to hospital and name only.     Labs on Admission: I have personally reviewed following labs and imaging studies  CBC:  Recent Labs Lab 04/08/16 0558  WBC 7.9  NEUTROABS 5.7  HGB 9.3*  HCT 27.7*  MCV 106.9*  PLT 123456*   Basic Metabolic Panel:  Recent Labs Lab 04/08/16 0558  NA 140  K 3.8  CL 109  CO2 22  GLUCOSE 113*  BUN 26*  CREATININE 1.57*  CALCIUM 10.5*   GFR: Estimated Creatinine Clearance: 28.9 mL/min (by C-G formula based on SCr of 1.57 mg/dL (H)). Liver Function Tests:  Recent Labs Lab 04/08/16 0558  AST 22  ALT 15  ALKPHOS 53  BILITOT 0.3  PROT 8.1  ALBUMIN 4.3   No results for input(s): LIPASE, AMYLASE in the last 168 hours.  Recent Labs Lab 04/08/16 0558  AMMONIA 28   Coagulation  Profile: No results for input(s): INR, PROTIME in the last 168 hours. Cardiac Enzymes: No results for input(s): CKTOTAL, CKMB, CKMBINDEX, TROPONINI in the last 168 hours. BNP (last 3 results) No results for input(s): PROBNP in the last 8760 hours. HbA1C: No results for input(s): HGBA1C in the last 72 hours. CBG: No results for input(s): GLUCAP in the last 168 hours. Lipid Profile: No results for input(s): CHOL, HDL, LDLCALC, TRIG, CHOLHDL, LDLDIRECT in the last 72 hours. Thyroid Function Tests: No results for input(s): TSH, T4TOTAL, FREET4, T3FREE, THYROIDAB in the last 72 hours. Anemia Panel:  Recent Labs  04/08/16 0755  RETICCTPCT 1.3   Urine analysis:    Component Value Date/Time   COLORURINE YELLOW 04/08/2016 0531   APPEARANCEUR HAZY (A) 04/08/2016 0531   LABSPEC 1.010 04/08/2016 0531   PHURINE 5.0 04/08/2016 [place] 04/08/2016 0531  GLUCOSEU NEG mg/dL 09/12/2006 0000   HGBUR NEGATIVE 04/08/2016 [place] 04/08/2016 0531   KETONESUR NEGATIVE 04/08/2016 0531   PROTEINUR NEGATIVE 04/08/2016 0531   UROBILINOGEN 0.2 07/23/2013 1431   NITRITE NEGATIVE 04/08/2016 0531   LEUKOCYTESUR LARGE (A) 04/08/2016 0531    Radiological Exams on Admission: No results found.  Assessment/Plan  # Confusion, likely acute metabolic encephalopathy in the setting of UTI versus underlying cognitive impairment related with age and HIV: -As per Karen Luna's sister, at baseline Karen Luna is alert awake and oriented. She lives by herself. Recently she has been confused more. Karen Luna has no focal neurological deficit. Recent CT scan of head done on November 25 suggestive of chronic small vessel disease. -Continue IV ceftriaxone for the treatment of UTI -Ammonia level not elevated. -TSH level acceptable on 11/25 -Continue IV hydration -PT, OT evaluation. -Referral to care manager and social worker for discharge planning.  #Acute cystitis without hematuria: The  cultures sent from ER. Continue IV ceftriaxone. Follow up culture results.  #Essential hypertension: Resume home medication. Monitor blood pressure. Hold hydrochlorothiazide.  #Chronic kidney disease is stage III: Serum creatinine level around baseline. Monitor BMP. Avoid nephrotoxins.   # Human immunodeficiency virus (HIV) disease ([name]): Resume antiviral medications.  # Anemia of chronic disease: Hemoglobin around baseline.     DVT prophylaxis: Lovenox subcutaneous Code Status: Full code Family Communication: Discussed with the Karen Luna's sister at bedside Disposition Plan: Likely discharge home with home care versus rehabilitation in 1-2 days Consults called: None Admission status: Observation.   Dron Tanna Furry MD [facility] Pager [phone]  If 7PM-7AM, please contact night-coverage [email or web address] Password [email or web address]  04/08/2016, 10:04 AM

## 2016-04-08 NOTE — ED Provider Notes (Signed)
[facility] DEPT Provider Note   CSN: [id number] Arrival date & time: 04/08/16  0503  Time seen 05:20 AM   History   Chief Complaint Chief Complaint  Patient presents with  . Altered Mental Status   Level V caveat for altered mental status  HPI Karen Luna is a 82 y.o. female.  HPI when I go in to talk to the patient she states "I have a cold". Then she states I can't explain. She then becomes very tearful. She denies cough, she only has rhinorrhea when crying, she denies sore throat, nausea, vomiting, diarrhea, abdominal pain, chest pain, or dysuria. She does report some frequency. Her son states he saw her yesterday evening, December 9. And she was okay. However she called him at 3 AM this morning because she didn't know what time it was. He states he told her the time however she asked to begin several times. He states she had something similar on November 25. She was discharged home from the hospital with urinary tract infection. He states she was given antibiotics for 3 days that she took twice a day. He states he tried to get her a follow-up appointment with her PCP this past week to make sure her infection was gone however they cannot see her until the 15th.  PCP Dr Eppie Gibson [facility]  Past Medical History:  Diagnosis Date  . Anemia of chronic disease 07/06/2011  . Aortic atherosclerosis ([place]) 01/06/2015   Seen on CT scan, currently asymptomatic  . Cancer ([place])    uterine( per pt)  . Chronic kidney disease, stage 3 09/01/2008  . Chronic venous insufficiency 10/03/2012  . Essential hypertension 09/19/2006  . Heart murmur    Aortic area  . History of cervical cancer 08/11/2012   s/p hysterectomy, remote, specifics unknown   . Human immunodeficiency virus (HIV) disease ([place]) 09/12/2006  . 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 07/25/2012  . Low grade squamous intraepithelial lesion (LGSIL) on cervical Pap smear 08/06/2007   LGSIL: 08/06/2007, 11/05/2008, 05/11/2009 ASC-US:  05/27/2008, 08/31/2009, 05/24/2010, 05/23/2012 High Risk HPV: Detected   . Onychomycosis of left great toe 09/09/2015  . Overweight (BMI 25.0-29.9) 07/25/2012    Patient Active Problem List   Diagnosis Date Noted  . Confusion 04/08/2016  . Acute encephalopathy 03/24/2016  . UTI (urinary tract infection) 03/24/2016  . Hyperammonemia ([place]) 03/24/2016  . [place] I 03/24/2016  . Onychomycosis of left great toe 09/09/2015  . Aortic atherosclerosis ([place]) 01/06/2015  . Acquired heart block 12/29/2014  . Encounter for long-term (current) use of medications 09/16/2014  . Screening examination for venereal disease 09/16/2014  . Chronic venous insufficiency 10/03/2012  . History of cervical cancer 08/11/2012  . Overweight (BMI 25.0-29.9) 07/25/2012  . Healthcare maintenance 07/25/2012  . 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 07/25/2012  . Anemia of chronic disease 07/06/2011  . Chronic kidney disease, stage 3 09/01/2008  . Low grade squamous intraepithelial lesion (LGSIL) on cervical Pap smear 08/06/2007  . Essential hypertension 09/19/2006  . Human immunodeficiency virus (HIV) disease ([place]) 09/12/2006    Past Surgical History:  Procedure Laterality Date  . ABDOMINAL HYSTERECTOMY      OB History    No data available       Home Medications    Prior to Admission medications   Medication Sig Start Date End Date Taking? Authorizing Provider  abacavir-dolutegravir-lamiVUDine (TRIUMEQ) 600-50-300 MG tablet Take 1 tablet by mouth daily. 06/03/15  Yes Thayer Headings, MD  amLODipine ([place]) 10  MG tablet Take 1 tablet (10 mg total) by mouth daily. 05/27/15 05/26/16 Yes [name], MD  cefUROXime (CEFTIN) 500 MG tablet Take 1 tablet (500 mg total) by mouth 2 (two) times daily with a meal. 03/26/16  Yes Samuella Cota, MD  hydrALAZINE (APRESOLINE) 100 MG tablet Take 1 tablet (100 mg total) by mouth 3 (three) times daily. 09/09/15  Yes [name], MD    valsartan-hydrochlorothiazide (DIOVAN-HCT) 320-25 MG tablet Take 1 tablet by mouth daily. 05/27/15  Yes [name], MD    Family History Family History  Problem Relation Age of Onset  . Hypertension Mother   . Heart disease Mother   . Hypertension Father   . Heart disease Father   . Hypertension Sister   . Healthy Brother   . HIV/AIDS Daughter   . Healthy Son   . Healthy Sister   . Healthy Sister   . Healthy Sister   . Healthy Sister   . Early death Sister     Gun shot wound  . Healthy Brother   . Healthy Son   . Cerebrovascular Accident Son     Social History Social History  Substance Use Topics  . Smoking status: Former Smoker    Types: Cigarettes    Quit date: 08/06/1988  . Smokeless tobacco: Never Used  . Alcohol use No  lives at home Lives alone   Allergies   Clonidine derivatives   Review of Systems Review of Systems  All other systems reviewed and are negative.    Physical Exam Updated Vital Signs BP 173/64 (BP Location: Left Arm)   Pulse 63   Temp 97.7 F (36.5 C) (Oral)   Resp 18   Ht 5\' 9"  (1.753 m)   Wt 148 lb (67.1 kg)   SpO2 100%   BMI 21.86 kg/m   Vital signs normal    Physical Exam  Constitutional: She is oriented to person, place, and time. She appears well-developed and well-nourished.  Non-toxic appearance. She does not appear ill. No distress.  HENT:  Head: Normocephalic and atraumatic.  Right Ear: External ear normal.  Left Ear: External ear normal.  Nose: Nose normal. No mucosal edema or rhinorrhea.  Mouth/Throat: Oropharynx is clear and moist and mucous membranes are normal. No dental abscesses or uvula swelling.  Eyes: Conjunctivae and EOM are normal. Pupils are equal, round, and reactive to light.  Neck: Normal range of motion and full passive range of motion without pain. Neck supple.  Cardiovascular: Normal rate, regular rhythm and normal heart sounds.  Exam reveals no gallop and no friction rub.   No murmur  heard. Pulmonary/Chest: Effort normal and breath sounds normal. No respiratory distress. She has no wheezes. She has no rhonchi. She has no rales. She exhibits no tenderness and no crepitus.  Abdominal: Soft. Normal appearance and bowel sounds are normal. She exhibits no distension. There is no tenderness. There is no rebound and no guarding.  Musculoskeletal: Normal range of motion. She exhibits no edema or tenderness.  Moves all extremities well.   Neurological: She is alert and oriented to person, place, and time. She has normal strength. No cranial nerve deficit.  Skin: Skin is warm, dry and intact. No rash noted. No erythema. No pallor.  Psychiatric: Her speech is normal and behavior is normal. Her mood appears anxious. Her affect is labile.  Gets tearful at times  Nursing note and vitals reviewed.    ED Treatments / Results  Labs (all labs ordered are listed,  but only abnormal results are displayed) Results for orders placed or performed during the hospital encounter of 04/08/16  Urinalysis, Routine w reflex microscopic  Result Value Ref Range   Color, Urine YELLOW YELLOW   APPearance HAZY (A) CLEAR   Specific Gravity, Urine 1.010 1.005 - 1.030   pH 5.0 5.0 - 8.0   Glucose, UA NEGATIVE NEGATIVE mg/dL   Hgb urine dipstick NEGATIVE NEGATIVE   Bilirubin Urine NEGATIVE NEGATIVE   Ketones, ur NEGATIVE NEGATIVE mg/dL   Protein, ur NEGATIVE NEGATIVE mg/dL   Nitrite NEGATIVE NEGATIVE   Leukocytes, UA LARGE (A) NEGATIVE   RBC / HPF 0-5 0 - 5 RBC/hpf   WBC, UA TOO NUMEROUS TO COUNT 0 - 5 WBC/hpf   Bacteria, UA RARE (A) NONE SEEN   Hyaline Casts, UA PRESENT   Comprehensive metabolic panel  Result Value Ref Range   Sodium 140 135 - 145 mmol/L   Potassium 3.8 3.5 - 5.1 mmol/L   Chloride 109 101 - 111 mmol/L   CO2 22 22 - 32 mmol/L   Glucose, Bld 113 (H) 65 - 99 mg/dL   BUN 26 (H) 6 - 20 mg/dL   Creatinine, Ser 1.57 (H) 0.44 - 1.00 mg/dL   Calcium 10.5 (H) 8.9 - 10.3 mg/dL   Total  Protein 8.1 6.5 - 8.1 g/dL   Albumin 4.3 3.5 - 5.0 g/dL   AST 22 15 - 41 U/L   ALT 15 14 - 54 U/L   Alkaline Phosphatase 53 38 - 126 U/L   Total Bilirubin 0.3 0.3 - 1.2 mg/dL   GFR calc non Af Amer 30 (L) >60 mL/min   GFR calc Af Amer 34 (L) >60 mL/min   Anion gap 9 5 - 15  CBC with Differential  Result Value Ref Range   WBC 7.9 4.0 - 10.5 K/uL   RBC 2.59 (L) 3.87 - 5.11 MIL/uL   Hemoglobin 9.3 (L) 12.0 - 15.0 g/dL   HCT 27.7 (L) 36.0 - 46.0 %   MCV 106.9 (H) 78.0 - 100.0 fL   MCH 35.9 (H) 26.0 - 34.0 pg   MCHC 33.6 30.0 - 36.0 g/dL   RDW 14.8 11.5 - 15.5 %   Platelets 411 (H) 150 - 400 K/uL   Neutrophils Relative % 72 %   Neutro Abs 5.7 1.7 - 7.7 K/uL   Lymphocytes Relative 17 %   Lymphs Abs 1.3 0.7 - 4.0 K/uL   Monocytes Relative 10 %   Monocytes Absolute 0.8 0.1 - 1.0 K/uL   Eosinophils Relative 1 %   Eosinophils Absolute 0.1 0.0 - 0.7 K/uL   Basophils Relative 0 %   Basophils Absolute 0.0 0.0 - 0.1 K/uL  Ammonia  Result Value Ref Range   Ammonia 28 9 - 35 umol/L   Laboratory interpretation all normal except UTI, stable anemia, elevated MCV c/o folic acid or vit [id number] deficiency, renal insufficiency    EKG  EKG Interpretation None       Radiology No results found.   Dg Chest 2 View  Result Date: 03/24/2016 CLINICAL DATA:  Altered mental statusIMPRESSION: No active cardiopulmonary disease. Electronically Signed   By: Inez Catalina M.D.   On: 03/24/2016 07:35   Ct Head Wo Contrast  Result Date: 03/24/2016 CLINICAL DATA:  82 year old female with altered mental status for 1 week. Initial encounter. IMPRESSION: Stable noncontrast CT appearance of the brain since 02/08/2016 with cerebral white matter and right caudate nucleus hypodensity suggesting chronic small vessel disease. Electronically  Signed   By: Genevie Ann M.D.   On: 03/24/2016 07:46    Procedures Procedures (including critical care time)  Medications Ordered in ED Medications  cefTRIAXone (ROCEPHIN) 1  g in dextrose 5 % 50 mL IVPB (1 g Intravenous New Bag/Given 04/08/16 0717)     Initial Impression / Assessment and Plan / ED Course  I have reviewed the triage vital signs and the nursing notes.  Pertinent labs & imaging results that were available during my care of the patient were reviewed by me and considered in my medical decision making (see chart for details).  Clinical Course    Urinalysis and blood work was ordered. I did not repeat her head CT her chest x-ray because they were done recently for similar symptoms. There was no urine culture done her last admission. She was discharged on Ceftin.  After reviewing her urinalysis she was given Rocephin IV. She also had a urine culture sent this visit. I talked to patient and her son about readmission and they are agreeable.  07:37 AM Dr Carolin Sicks, hospitalist, admit to med-surg  Final Clinical Impressions(s) / ED Diagnoses   Final diagnoses:  Disorientation  Urinary tract infection without hematuria, site unspecified    Plan admission  Rolland Porter, MD, Barbette Or, MD 04/08/16 [phone]

## 2016-04-08 NOTE — ED Notes (Signed)
Pt resting quietly.  Denies any pain

## 2016-04-08 NOTE — ED Triage Notes (Signed)
Per pt family she is still having issues w/ getting up & not knowing what going on. Pt seen in the ER 2 weeks ago for the same. Pt says " I just don't know what's wrong." Pt gets upset when asked questions she is unable to answer and says "Iam losing my mind. "

## 2016-04-09 DIAGNOSIS — N3000 Acute cystitis without hematuria: Secondary | ICD-10-CM | POA: Diagnosis present

## 2016-04-09 DIAGNOSIS — G9340 Encephalopathy, unspecified: Secondary | ICD-10-CM | POA: Diagnosis not present

## 2016-04-09 DIAGNOSIS — Z90710 Acquired absence of both cervix and uterus: Secondary | ICD-10-CM | POA: Diagnosis not present

## 2016-04-09 DIAGNOSIS — G9341 Metabolic encephalopathy: Secondary | ICD-10-CM | POA: Diagnosis present

## 2016-04-09 DIAGNOSIS — I1310 Hypertensive heart and chronic kidney disease without heart failure, with stage 1 through stage 4 chronic kidney disease, or unspecified chronic kidney disease: Secondary | ICD-10-CM | POA: Diagnosis present

## 2016-04-09 DIAGNOSIS — Z8249 Family history of ischemic heart disease and other diseases of the circulatory system: Secondary | ICD-10-CM | POA: Diagnosis not present

## 2016-04-09 DIAGNOSIS — R55 Syncope and collapse: Secondary | ICD-10-CM | POA: Diagnosis not present

## 2016-04-09 DIAGNOSIS — N390 Urinary tract infection, site not specified: Secondary | ICD-10-CM | POA: Diagnosis not present

## 2016-04-09 DIAGNOSIS — R4182 Altered mental status, unspecified: Secondary | ICD-10-CM | POA: Diagnosis present

## 2016-04-09 DIAGNOSIS — D638 Anemia in other chronic diseases classified elsewhere: Secondary | ICD-10-CM | POA: Diagnosis present

## 2016-04-09 DIAGNOSIS — Z87891 Personal history of nicotine dependence: Secondary | ICD-10-CM | POA: Diagnosis not present

## 2016-04-09 DIAGNOSIS — Z8541 Personal history of malignant neoplasm of cervix uteri: Secondary | ICD-10-CM | POA: Diagnosis not present

## 2016-04-09 DIAGNOSIS — B20 Human immunodeficiency virus [HIV] disease: Secondary | ICD-10-CM | POA: Diagnosis present

## 2016-04-09 DIAGNOSIS — N183 Chronic kidney disease, stage 3 (moderate): Secondary | ICD-10-CM

## 2016-04-09 DIAGNOSIS — Z823 Family history of stroke: Secondary | ICD-10-CM | POA: Diagnosis not present

## 2016-04-09 DIAGNOSIS — Z830 Family history of human immunodeficiency virus [HIV] disease: Secondary | ICD-10-CM | POA: Diagnosis not present

## 2016-04-09 DIAGNOSIS — I700 Atherosclerosis of aorta: Secondary | ICD-10-CM | POA: Diagnosis present

## 2016-04-09 DIAGNOSIS — Z888 Allergy status to other drugs, medicaments and biological substances status: Secondary | ICD-10-CM | POA: Diagnosis not present

## 2016-04-09 LAB — BASIC METABOLIC PANEL
ANION GAP: 6 (ref 5–15)
BUN: 21 mg/dL — ABNORMAL HIGH (ref 6–20)
CO2: 24 mmol/L (ref 22–32)
Calcium: 9.1 mg/dL (ref 8.9–10.3)
Chloride: 110 mmol/L (ref 101–111)
Creatinine, Ser: 1.51 mg/dL — ABNORMAL HIGH (ref 0.44–1.00)
GFR, EST AFRICAN AMERICAN: 36 mL/min — AB (ref >60)
GFR, EST NON AFRICAN AMERICAN: 31 mL/min — AB (ref >60)
GLUCOSE: 83 mg/dL (ref 65–99)
POTASSIUM: 4.3 mmol/L (ref 3.5–5.1)
Sodium: 140 mmol/L (ref 135–145)

## 2016-04-09 LAB — CBC
HEMATOCRIT: 23.7 % — AB (ref 36.0–46.0)
HEMOGLOBIN: 7.6 g/dL — AB (ref 12.0–15.0)
MCH: 34.9 pg — AB (ref 26.0–34.0)
MCHC: 32.1 g/dL (ref 30.0–36.0)
MCV: 108.7 fL — ABNORMAL HIGH (ref 78.0–100.0)
Platelets: 399 10*3/uL (ref 150–400)
RBC: 2.18 MIL/uL — AB (ref 3.87–5.11)
RDW: 14.4 % (ref 11.5–15.5)
WBC: 5.5 10*3/uL (ref 4.0–10.5)

## 2016-04-09 MED ORDER — FERROUS SULFATE 325 (65 FE) MG PO TABS
325.0000 mg | ORAL_TABLET | Freq: Every day | ORAL | Status: DC
  Administered 2016-04-09 – 2016-04-12 (×4): 325 mg via ORAL
  Filled 2016-04-09 (×4): qty 1

## 2016-04-09 NOTE — Evaluation (Signed)
Physical Therapy Evaluation Patient Details Name: Karen Luna MRN: [id number] DOB: 11-29-1931 Today's Date: 04/09/2016   History of Present Illness  Karen Luna is a 82 y.o. female with medical history significant of HIV on day viral medications, hypertension, chronic kidney disease is stage III, recently admitted for confusion when patient was discharged with Ceftin for the treatment of UTI probably in by patient's son for the evaluation of confusion. As per ER, today morning around 3 AM patient called her son when she did not know about date and was confused. Patient recently had CT scan of head which was unremarkable  Clinical Impression  Pt received in bed, and was agreeable to PT evaluation.  Pt expressed that she is normally independent with all functional mobility at home.  Her son assists her with running errands.  During today's PT evaluation, she was unsure of why she was in the hospital, and was not able to tell me the exact date, but stated the month and year.  During gait, she ambulated 577f independently with no LOB, however she requires directional cues to be able to return back to her room.  Pt does not demonstrate need for skilled PT at this time, will sign off.      Follow Up Recommendations No PT follow up    Equipment Recommendations  None recommended by PT    Recommendations for Other Services       Precautions / Restrictions Precautions Precautions: None Restrictions Weight Bearing Restrictions: No      Mobility  Bed Mobility Overal bed mobility: Independent                Transfers Overall transfer level: Independent                  Ambulation/Gait Ambulation/Gait assistance: Independent Ambulation Distance (Feet): 500 Feet Assistive device: None Gait Pattern/deviations: WFL(Within Functional Limits)     General Gait Details: Pt requires assistance to find her way back to the room.  She was able to recall her room number and  look at the signs when cued to do so, however, once she reached the room, she continued past it until pt was cued to look at each room.   Stairs            Wheelchair Mobility    Modified Rankin (Stroke Patients Only)       Balance Overall balance assessment: Independent                                           Pertinent Vitals/Pain Pain Assessment: No/denies pain    Home Living   Living Arrangements: Alone Available Help at Discharge: Family;Available PRN/intermittently (sister's - about every other day. ) Type of Home: House (but she is renting.) Home Access: Stairs to enter   [employer] Steps: 2 steps.  Home Layout: One level Home Equipment: None      Prior Function Level of Independence: Independent         Comments: son assists her with running errands.  Pt will go with him to run the errands.      Hand Dominance   Dominant Hand: Right    Extremity/Trunk Assessment   Upper Extremity Assessment: Overall WFL for tasks assessed           Lower Extremity Assessment: Overall WFL for tasks assessed  Communication   Communication: No difficulties  Cognition   Behavior During Therapy: WFL for tasks assessed/performed Overall Cognitive Status: Impaired/Different from baseline Area of Impairment: Orientation Orientation Level: Situation;Disoriented to (Pt not able to state the reason for her admission.  States it's December, 2017)                  General Comments      Exercises     Assessment/Plan    PT Assessment All further PT needs can be met in the next venue of care  PT Problem List            PT Treatment Interventions      PT Goals (Current goals can be found in the Care Plan section)  Acute Rehab PT Goals PT Goal Formulation: All assessment and education complete, DC therapy    Frequency     Barriers to discharge        Co-evaluation               End of  Session Equipment Utilized During Treatment: Gait belt Activity Tolerance: Patient tolerated treatment well Patient left: in chair;with call bell/phone within reach Nurse Communication: Mobility status    Functional Assessment Tool Used: [employer] "6-clicks"  Functional Limitation: Mobility: Walking and moving around Mobility: Walking and Moving Around Current Status [phone]): 0 percent impaired, limited or restricted Mobility: Walking and Moving Around Goal Status [phone]): 0 percent impaired, limited or restricted Mobility: Walking and Moving Around Discharge Status [phone]): 0 percent impaired, limited or restricted    Time: 4696-2952 PT Time Calculation (min) (ACUTE ONLY): 11 min   Charges:   PT Evaluation $PT Eval Low Complexity: 1 Procedure     PT G Codes:   PT G-Codes **NOT FOR INPATIENT CLASS** Functional Assessment Tool Used: [employer] "6-clicks"  Functional Limitation: Mobility: Walking and moving around Mobility: Walking and Moving Around Current Status [phone]): 0 percent impaired, limited or restricted Mobility: Walking and Moving Around Goal Status [phone]): 0 percent impaired, limited or restricted Mobility: Walking and Moving Around Discharge Status [phone]): 0 percent impaired, limited or restricted    Beth Naithan Delage, PT, DPT X: [id number]

## 2016-04-09 NOTE — Progress Notes (Signed)
PROGRESS NOTE    ADDELINE Luna  [id number] DOB: 22-Jun-1931 DOA: 04/08/2016 PCP: [name], MD   Brief Narrative: 82 y.o. female with medical history significant of HIV on day viral medications, hypertension, chronic kidney disease is stage III, recently admitted for confusion when patient was discharged with Ceftin for the treatment of UTI brought in by patient's son for the evaluation of confusion.   Assessment & Plan:   #Acute metabolic encephalopathy in the setting of UTI: Treated with IV antibiotics and fluid. Improvement in mental status today. She has no focal neurological deficit. Recent CT scan of head done on November 25 suggestive of chronic small vessel disease. PT and OT evaluated the patient.  #Acute cystitis without hematuria: Continue IV ceftriaxone. Follow-up culture results before deciding oral antibiotics on discharge. Recently patient was discharged with oral and device without improvement. The cultures were not sent at that time.  #Essential hypertension: Resume home medication. Monitor blood pressure. Hold hydrochlorothiazide.  #Chronic kidney disease is stage III: Serum creatinine level around baseline. Monitor BMP. Avoid nephrotoxins.   # Human immunodeficiency virus (HIV) disease ([place]): Resume antiviral medications.  # Anemia of chronic disease: Hemoglobin dropped today likely due to dilution. Repeat CBC in the morning.  DVT prophylaxis: Lovenox subcutaneous Code Status: Full code Family Communication: Discussed with the patient's sister at bedside Disposition Plan: Likely discharge home tomorrow.  Consultants:   None  Procedures: None Antimicrobials: Ceftriaxone  Subjective: Patient was seen and examined at bedside. Patient denied any symptoms. She was alert awake and oriented. Denied headache, dizziness, nausea, vomiting, chest pain or shortness of breath. Sister at bedside.   Objective: Vitals:   04/08/16 0958 04/08/16 1431  04/08/16 2100 04/09/16 0500  BP: (!) 166/62 (!) 135/49 (!) 145/61 (!) 134/54  Pulse: 70 61 65 60  Resp: 18 18 18 18   Temp: 97.5 F (36.4 C) 98.1 F (36.7 C) 97.9 F (36.6 C) 98.1 F (36.7 C)  TempSrc: Oral Oral Oral Oral  SpO2:  100% 99% 100%  Weight: 66.2 kg (146 lb)     Height: 5\' 9"  (1.753 m)       Intake/Output Summary (Last 24 hours) at 04/09/16 1450 Last data filed at 04/09/16 0900  Gross per 24 hour  Intake             2105 ml  Output                0 ml  Net             2105 ml   Filed Weights   04/08/16 0517 04/08/16 0958  Weight: 67.1 kg (148 lb) 66.2 kg (146 lb)    Examination:  General exam: Pleasant elderly female lying on bed, Appears calm and comfortable  Respiratory system: Clear to auscultation. Respiratory effort normal. No wheezing or crackle Cardiovascular system: S1 & S2 heard, RRR.  No pedal edema. Gastrointestinal system: Abdomen is nondistended, soft and nontender. Normal bowel sounds heard. Central nervous system: Alert and oriented. No focal neurological deficits. Extremities: Symmetric 5 x 5 power. Skin: No rashes, lesions or ulcers Psychiatry: Judgement and insight appear normal. Mood & affect appropriate.     Data Reviewed: I have personally reviewed following labs and imaging studies  CBC:  Recent Labs Lab 04/08/16 0558 04/09/16 0426  WBC 7.9 5.5  NEUTROABS 5.7  --   HGB 9.3* 7.6*  HCT 27.7* 23.7*  MCV 106.9* 108.7*  PLT 411* [id number]   Basic Metabolic Panel:  Recent Labs Lab 04/08/16 0558 04/09/16 0426  NA 140 140  K 3.8 4.3  CL 109 110  CO2 22 24  GLUCOSE 113* 83  BUN 26* 21*  CREATININE 1.57* 1.51*  CALCIUM 10.5* 9.1   GFR: Estimated Creatinine Clearance: 30 mL/min (by C-G formula based on SCr of 1.51 mg/dL (H)). Liver Function Tests:  Recent Labs Lab 04/08/16 0558  AST 22  ALT 15  ALKPHOS 53  BILITOT 0.3  PROT 8.1  ALBUMIN 4.3   No results for input(s): LIPASE, AMYLASE in the last 168 hours.  Recent  Labs Lab 04/08/16 0558  AMMONIA 28   Coagulation Profile: No results for input(s): INR, PROTIME in the last 168 hours. Cardiac Enzymes: No results for input(s): CKTOTAL, CKMB, CKMBINDEX, TROPONINI in the last 168 hours. BNP (last 3 results) No results for input(s): PROBNP in the last 8760 hours. HbA1C: No results for input(s): HGBA1C in the last 72 hours. CBG: No results for input(s): GLUCAP in the last 168 hours. Lipid Profile: No results for input(s): CHOL, HDL, LDLCALC, TRIG, CHOLHDL, LDLDIRECT in the last 72 hours. Thyroid Function Tests: No results for input(s): TSH, T4TOTAL, FREET4, T3FREE, THYROIDAB in the last 72 hours. Anemia Panel:  Recent Labs  04/08/16 0755  VITAMINB12 226  FOLATE 13.3  FERRITIN 186  TIBC 293  IRON 19*  RETICCTPCT 1.3   Sepsis Labs: No results for input(s): PROCALCITON, LATICACIDVEN in the last 168 hours.  No results found for this or any previous visit (from the past 240 hour(s)).       Radiology Studies: No results found.      Scheduled Meds: . abacavir-dolutegravir-lamiVUDine  1 tablet Oral Daily  . amLODipine  10 mg Oral Daily  . cefTRIAXone (ROCEPHIN)  IV  1 g Intravenous Q24H  . enoxaparin (LOVENOX) injection  30 mg Subcutaneous Q24H  . ferrous sulfate  325 mg Oral Q breakfast  . hydrALAZINE  100 mg Oral TID  . irbesartan  300 mg Oral Daily   Continuous Infusions: . sodium chloride 75 mL/hr at 04/09/16 0641     LOS: 0 days    Jaicion Laurie Tanna Furry, MD [facility] Pager [phone]  If 7PM-7AM, please contact night-coverage [email or web address] Password [email or web address] 04/09/2016, 2:50 PM

## 2016-04-09 NOTE — Clinical Social Work Note (Signed)
CSW received consult for "discharge plan." Per therapy, plan is for pt to return home. CSW signing off.   Benay Pike, [place]

## 2016-04-09 NOTE — Care Management Note (Addendum)
Case Management Note  Patient Details  Name: Karen Luna MRN: [id number] Date of Birth: 1932-03-20  Subjective/Objective:                  Pt admitted with UTI. She is from home, reports living alone but has sisters and son who assist with shopping and appointments. She is ind with ADL's. She has PCP but reports not going. HH offered and pt refuses. Pt still seems somewhat confused today. All info provided by pt confirmed with pt's sister (with pt's permission). However, per sister, pt does go to PCP appointments regularly. Pt's sister says any decisions about HH needs to be verified with pt's son. Per pt's son, he would like [facility] nursing for hopsital f/u. And he has chosen [facility] from list of [facility] providers.   Action/Plan: Anticipate DC home tomorrow. Will cont to follow to complete referral day of DC, will discuss again with pt prior to DC. Marland Kitchen   Expected Discharge Date:     04/10/2016             Expected Discharge Plan:  [place]  In-House Referral:  NA  Discharge planning Services  CM Consult  Post Acute Care Choice:  Home Health Choice offered to:  Adult Children  DME Arranged:    DME Agency:     HH Arranged:  RN [place] Agency:  [place]  Status of Service:  In process, will continue to follow  Sherald Barge, RN 04/09/2016, 4:35 PM

## 2016-04-09 NOTE — Evaluation (Signed)
Occupational Therapy Evaluation Patient Details Name: AUGUSTA KRAFT MRN: [id number] DOB: 1931-05-28 Today's Date: 04/09/2016    History of Present Illness NATASSIA HUMES is a 82 y.o. female with medical history significant of HIV on day viral medications, hypertension, chronic kidney disease is stage III, recently admitted for confusion when patient was discharged with Ceftin for the treatment of UTI probably in by patient's son for the evaluation of confusion. As per ER, today morning around 3 AM patient called her son when she did not know about date and was confused. Patient recently had CT scan of head which was unremarkable   Clinical Impression   Patient agreeable to participate in OT evaluation. Patient is currently Independent and functioning at baseline. No need for follow up OT services. Patient is safe to discharge home when appropriate.     Follow Up Recommendations  No OT follow up    Equipment Recommendations  None recommended by OT    Recommendations for Other Services       Precautions / Restrictions Precautions Precautions: None Restrictions Weight Bearing Restrictions: No      Mobility Bed Mobility Overal bed mobility: Independent                Transfers Overall transfer level: Independent                         ADL Overall ADL's : Independent                                       General ADL Comments: Assist with IV pole only.               Pertinent Vitals/Pain Pain Assessment: No/denies pain     Hand Dominance Right   Extremity/Trunk Assessment Upper Extremity Assessment Upper Extremity Assessment: Overall WFL for tasks assessed   Lower Extremity Assessment Lower Extremity Assessment: Defer to PT evaluation       Communication Communication Communication: No difficulties   Cognition Arousal/Alertness: Awake/alert Behavior During Therapy: WFL for tasks assessed/performed Overall  Cognitive Status: Within Functional Limits for tasks assessed                                Home Living Family/patient expects to be discharged to:: Private residence Living Arrangements: Alone   Type of Home: Apartment Home Access: Stairs to enter [employer] of Steps: 4-5   Home Layout: One level     Bathroom Shower/Tub: Tub/shower unit         Home Equipment: None          Prior Functioning/Environment Level of Independence: Independent                             Barriers to D/C:  None             End of Session    Activity Tolerance: Patient tolerated treatment well Patient left: in bed;with call bell/phone within reach;with family/visitor present   Time: [id number] OT Time Calculation (min): 16 min Charges:  OT General Charges $OT Visit: 1 Procedure OT Evaluation $OT Eval Low Complexity: 1 Procedure G-Codes: OT G-codes **NOT FOR INPATIENT CLASS** Functional Limitation: Self care Self Care Current Status [id number]): 0 percent impaired, limited or restricted Self Care  Goal Status [id number]): 0 percent impaired, limited or restricted Self Care Discharge Status [id number]): 0 percent impaired, limited or restricted  04/09/2016, 9:28 AM Ailene Ravel, OTR/L,CBIS  [phone]

## 2016-04-10 ENCOUNTER — Inpatient Hospital Stay (HOSPITAL_COMMUNITY): Payer: Commercial Managed Care - HMO

## 2016-04-10 DIAGNOSIS — R55 Syncope and collapse: Secondary | ICD-10-CM

## 2016-04-10 LAB — URINE CULTURE: Special Requests: NORMAL

## 2016-04-10 LAB — CBC
HCT: 28.1 % — ABNORMAL LOW (ref 36.0–46.0)
HEMOGLOBIN: 9.3 g/dL — AB (ref 12.0–15.0)
MCH: 35.8 pg — AB (ref 26.0–34.0)
MCHC: 33.1 g/dL (ref 30.0–36.0)
MCV: 108.1 fL — ABNORMAL HIGH (ref 78.0–100.0)
PLATELETS: 446 10*3/uL — AB (ref 150–400)
RBC: 2.6 MIL/uL — AB (ref 3.87–5.11)
RDW: 15 % (ref 11.5–15.5)
WBC: 5.6 10*3/uL (ref 4.0–10.5)

## 2016-04-10 LAB — TROPONIN I: Troponin I: <0.03 ng/mL (ref <0.03)

## 2016-04-10 MED ORDER — FERROUS SULFATE 325 (65 FE) MG PO TABS: 325.0000 mg | ORAL_TABLET | Freq: Every day | ORAL | 0 refills | Status: DC

## 2016-04-10 MED ORDER — ONDANSETRON HCL 4 MG/2ML IJ SOLN
4.0000 mg | Freq: Once | INTRAMUSCULAR | Status: AC
  Administered 2016-04-10: 4 mg via INTRAVENOUS

## 2016-04-10 MED ORDER — CEFADROXIL 500 MG PO CAPS: 500.0000 mg | ORAL_CAPSULE | Freq: Two times a day (BID) | ORAL | 0 refills | Status: AC

## 2016-04-10 MED ORDER — ALUM & MAG HYDROXIDE-SIMETH 200-200-20 MG/5ML PO SUSP: 30.0000 mL | Freq: Three times a day (TID) | ORAL | Status: DC | PRN

## 2016-04-10 NOTE — Care Management Important Message (Signed)
Important Message  Patient Details  Name: Karen Luna MRN: [id number] Date of Birth: [birth year]   Medicare Important Message Given:  Yes    Sherald Barge, RN 04/10/2016, 3:56 PM

## 2016-04-10 NOTE — Discharge Summary (Signed)
Physician Discharge Summary  Karen Luna [id number] DOB: 1931/08/29 DOA: 04/08/2016  PCP: Karen Cobble, MD  Admit date: 04/08/2016 Discharge date: 04/10/2016  Admitted From:Home Disposition: Home with visiting nurse   Recommendations for Outpatient Follow-up:  1. Follow up with PCP in 1-2 weeks 2. Please obtain BMP/CBC in one week   Home Health: Yes, visiting nurse Equipment/Devices: No Discharge Condition: Stable CODE STATUS: Full code Diet recommendation: Heart healthy  Brief/Interim Summary:82 y.o.femalewith medical history significant of HIV on day viral medications, hypertension, chronic kidney disease is stage III, recently admitted for confusion when patient was discharged with Ceftin for the treatment of UTI brought in by patient's son for the evaluation of confusion.   Please see below for detail:  #Acute metabolic encephalopathy in the setting of UTI: Treated with IV antibiotics and fluid. Mentally status significantly improved. She has no focal neurological deficit. Recent CT scan of head done on November 25 suggestive of chronic small vessel disease. Patient was evaluated by physical therapy recommended no outpatient PT needs. Patient will be discharged home with visiting nurse. Discussed with the case manager team. Patient was alert awake and oriented today. Looks like she is at her baseline.  #Acute cystitis without hematuria: Treated with IV ceftriaxone in the hospital with significant improvement in her mental status. The urine culture growing multiple organisms. I'm planning to discharge patient with 5 days of oral Duricef. Advised to follow-up with PCP.  #Essential hypertension: Resume home medication. Monitor blood pressure.   #Chronic kidney disease is stage III: Serum creatinine level around baseline. Monitor BMP. Avoid nephrotoxins.  #Human immunodeficiency virus (HIV) disease ([name]): Resume antiviral medications.  #Anemia of chronic  disease: He started on oral iron for iron deficiency anemia. Anemia probably contributed by chronic kidney disease. Hemoglobin around baseline. Advised to follow-up with PCP.  Discharge Diagnoses:  Active Problems:   Human immunodeficiency virus (HIV) disease ([facility])   Chronic kidney disease, stage 3   Anemia of chronic disease   Acute encephalopathy   Urinary tract infection without hematuria   Confusion    Discharge Instructions  Discharge Instructions    Call MD for:  difficulty breathing, headache or visual disturbances    Complete by:  As directed    Call MD for:  extreme fatigue    Complete by:  As directed    Call MD for:  hives    Complete by:  As directed    Call MD for:  persistant dizziness or light-headedness    Complete by:  As directed    Call MD for:  persistant nausea and vomiting    Complete by:  As directed    Call MD for:  temperature >100.4    Complete by:  As directed    Diet - low sodium heart healthy    Complete by:  As directed    Increase activity slowly    Complete by:  As directed        Medication List    TAKE these medications   abacavir-dolutegravir-lamiVUDine 600-50-300 MG tablet Commonly known as:  TRIUMEQ Take 1 tablet by mouth daily.   amLODipine 10 MG tablet Commonly known as:  NORVASC Take 1 tablet (10 mg total) by mouth daily.   cefadroxil 500 MG capsule Commonly known as:  DURICEF Take 1 capsule (500 mg total) by mouth 2 (two) times daily.   ferrous sulfate 325 (65 FE) MG tablet Take 1 tablet (325 mg total) by mouth daily with breakfast. Start taking on:  04/11/2016   hydrALAZINE 100 MG tablet Commonly known as:  APRESOLINE Take 1 tablet (100 mg total) by mouth 3 (three) times daily.   valsartan-hydrochlorothiazide 320-25 MG tablet Commonly known as:  DIOVAN-HCT Take 1 tablet by mouth daily.      Follow-up Information    Karen Cobble, MD. Schedule an appointment as soon as possible for a visit in 1 week(s).    Specialty:  Internal Medicine Contact information: [address] [place] [address] [phone]        Karen Gloss, MD .   Specialty:  Infectious Diseases Contact information: [address] [place] [address] [phone]          Allergies  Allergen Reactions  . Clonidine Derivatives Other (See Comments)    Non-specific heart block    Consultations: None  Procedures/Studies: none   Subjective: Patient was seen and examined at bedside. Patient was sitting on bed. She is alert awake, oriented to hospital, December and name. Denied fever, chills, headache, dizziness, nausea, vomiting, chest pressure shortness of breath.   Discharge Exam: Vitals:   04/09/16 2100 04/10/16 0500  BP: (!) 150/48 134/61  Pulse: 62 62  Resp: 18 18  Temp: 98.1 F (36.7 C) 98 F (36.7 C)   Vitals:   04/09/16 0500 04/09/16 1400 04/09/16 2100 04/10/16 0500  BP: (!) 134/54 129/61 (!) 150/48 134/61  Pulse: 60 69 62 62  Resp: 18 18 18 18   Temp: 98.1 F (36.7 C) 98.1 F (36.7 C) 98.1 F (36.7 C) 98 F (36.7 C)  TempSrc: Oral  Oral Oral  SpO2: 100% 100% 100% 100%  Weight:      Height:        General: Pt is alert, awake, not in acute distress Cardiovascular: RRR, S1/S2 +, no rubs, no gallops Respiratory: CTA bilaterally, no wheezing, no rhonchi Abdominal: Soft, NT, ND, bowel sounds + Extremities: no edema, no cyanosis Alert, awake, oriented to hospital, name and December. Following commands. No focal neurological deficit.   The results of significant diagnostics from this hospitalization (including imaging, microbiology, ancillary and laboratory) are listed below for reference.     Microbiology: Recent Results (from the past 240 hour(s))  Urine culture     Status: Abnormal   Collection Time: 04/08/16  5:31 AM  Result Value Ref Range Status   Specimen Description URINE, CATHETERIZED  Final   Special Requests Normal  Final   Culture MULTIPLE SPECIES  PRESENT, SUGGEST RECOLLECTION (A)  Final   Report Status 04/10/2016 FINAL  Final     Labs: BNP (last 3 results) No results for input(s): BNP in the last 8760 hours. Basic Metabolic Panel:  Recent Labs Lab 04/08/16 0558 04/09/16 0426  NA 140 140  K 3.8 4.3  CL 109 110  CO2 22 24  GLUCOSE 113* 83  BUN 26* 21*  CREATININE 1.57* 1.51*  CALCIUM 10.5* 9.1   Liver Function Tests:  Recent Labs Lab 04/08/16 0558  AST 22  ALT 15  ALKPHOS 53  BILITOT 0.3  PROT 8.1  ALBUMIN 4.3   No results for input(s): LIPASE, AMYLASE in the last 168 hours.  Recent Labs Lab 04/08/16 0558  AMMONIA 28   CBC:  Recent Labs Lab 04/08/16 0558 04/09/16 0426 04/10/16 0433  WBC 7.9 5.5 5.6  NEUTROABS 5.7  --   --   HGB 9.3* 7.6* 9.3*  HCT 27.7* 23.7* 28.1*  MCV 106.9* 108.7* 108.1*  PLT 411* 399 446*   Cardiac  Enzymes: No results for input(s): CKTOTAL, CKMB, CKMBINDEX, TROPONINI in the last 168 hours. BNP: Invalid input(s): POCBNP CBG: No results for input(s): GLUCAP in the last 168 hours. D-Dimer No results for input(s): DDIMER in the last 72 hours. Hgb A1c No results for input(s): HGBA1C in the last 72 hours. Lipid Profile No results for input(s): CHOL, HDL, LDLCALC, TRIG, CHOLHDL, LDLDIRECT in the last 72 hours. Thyroid function studies No results for input(s): TSH, T4TOTAL, T3FREE, THYROIDAB in the last 72 hours.  Invalid input(s): FREET3 Anemia work up  Recent Labs  04/08/16 0755  VITAMINB12 226  FOLATE 13.3  FERRITIN 186  TIBC 293  IRON 19*  RETICCTPCT 1.3   Urinalysis    Component Value Date/Time   COLORURINE YELLOW 04/08/2016 0531   APPEARANCEUR HAZY (A) 04/08/2016 0531   LABSPEC 1.010 04/08/2016 0531   PHURINE 5.0 04/08/2016 0531   GLUCOSEU NEGATIVE 04/08/2016 0531   GLUCOSEU NEG mg/dL 09/12/2006 0000   HGBUR NEGATIVE 04/08/2016 0531   BILIRUBINUR NEGATIVE 04/08/2016 0531   KETONESUR NEGATIVE 04/08/2016 0531   PROTEINUR NEGATIVE 04/08/2016 0531    UROBILINOGEN 0.2 07/23/2013 1431   NITRITE NEGATIVE 04/08/2016 0531   LEUKOCYTESUR LARGE (A) 04/08/2016 0531   Sepsis Labs Invalid input(s): PROCALCITONIN,  WBC,  LACTICIDVEN Microbiology Recent Results (from the past 240 hour(s))  Urine culture     Status: Abnormal   Collection Time: 04/08/16  5:31 AM  Result Value Ref Range Status   Specimen Description URINE, CATHETERIZED  Final   Special Requests Normal  Final   Culture MULTIPLE SPECIES PRESENT, SUGGEST RECOLLECTION (A)  Final   Report Status 04/10/2016 FINAL  Final     Time coordinating discharge: 27 minutes  SIGNED:   Rosita Fire, MD  [facility] 04/10/2016, 11:30 AM  If 7PM-7AM, please contact night-coverage [email or web address] Password [email or web address]

## 2016-04-10 NOTE — Progress Notes (Signed)
Addendum:  RN reported that patient had an episode of passing out when she was ready to go home with family. I went in and examined the patient.  She reported mild headache. Denied chest pain or SOB.  CN II-XII intact, muscle strength 5/5 in all extremities,  No focal neurological exam noted.   Plan: -Orthostatic BP acceptable  -check CT head -Echo, ekg and troponin level -cancel discharge for today. -tele monitor.  Discussed with the patient, family and nurse.

## 2016-04-10 NOTE — Care Management Note (Signed)
Case Management Note  Patient Details  Name: Karen Luna MRN: [id number] Date of Birth: 1931-09-29  Expected Discharge Date:     04/11/2016             Expected Discharge Plan:  [place]  In-House Referral:  NA  Discharge planning Services  CM Consult  Post Acute Care Choice:  Home Health Choice offered to:  Adult Children  HH Arranged:  RN [place] Agency:  [place]  Status of Service:  Completed, signed off   Additional Comments: Pt's mentation more clear and she is agreeable to [facility] services. She would like to use [facility]. DC planned for today but has been cancelled. Anticipate DC home tomorrow. Will provide updates to [facility] on DC. Pt aware HH has 48hrs to make first visit.   Sherald Barge, RN 04/10/2016, 3:56 PM

## 2016-04-11 ENCOUNTER — Inpatient Hospital Stay (HOSPITAL_COMMUNITY): Payer: Commercial Managed Care - HMO

## 2016-04-11 DIAGNOSIS — G9340 Encephalopathy, unspecified: Secondary | ICD-10-CM

## 2016-04-11 DIAGNOSIS — R55 Syncope and collapse: Secondary | ICD-10-CM

## 2016-04-11 LAB — ECHOCARDIOGRAM COMPLETE
Height: 69 in
WEIGHTICAEL: 2336 [oz_av]

## 2016-04-11 LAB — TROPONIN I
Troponin I: <0.03 ng/mL (ref <0.03)
Troponin I: <0.03 ng/mL (ref <0.03)

## 2016-04-11 NOTE — Evaluation (Addendum)
Physical Therapy Re-Evaluation Patient Details Name: Karen Luna MRN: [id number] DOB: Dec 12, 1931 Today's Date: 04/11/2016   History of Present Illness  Karen Luna is a 82 y.o. female with medical history significant of HIV on day viral medications, hypertension, chronic kidney disease is stage III, recently admitted for confusion when patient was discharged with Ceftin for the treatment of UTI probably in by patient's son for the evaluation of confusion. As per ER, today morning around 3 AM patient called her son when she did not know about date and was confused. Patient recently had CT scan of head which was unremarkable.  Pt had another syncopal episode 04/10/16 just prior to d/c, therefore, d/c was held.    Clinical Impression  Pt received in bed, sister present, and pt is agreeable to PT evaluation.  Orthostatic BP's taken during tx today and were as follows:  Supine: 139/45, HR: 45bpm Sitting: 145/59, HR: 56bpm Standing: 142/75, HR: 90 bpm - pt c/o feeling unsteady and dizzy After short distance ambulation: 150/63, HR: 67bpm  During ambulation, pt seems to be anxious, and demonstrates poor balance today.  She ambulated about 73ft before PT requested that she return to the room due to unsteadiness.  This is very different from initial evaluation on 12/11, where she ambulated independently x 543ft.  Will update d/c recommendations at this time to HHPT with 24/7 supervision/assistance vs ALF.  Discussed briefly with pt and her sister, but pt unsure if she would be willing to try this or not.       Follow Up Recommendations Home health PT;Supervision/Assistance - 24 hour;Other (comment) (vs ALF)    Equipment Recommendations  Rolling walker with 5" wheels    Recommendations for Other Services       Precautions / Restrictions Precautions Precautions: Fall Precaution Comments: due to recurrent syncopal episodes.  Restrictions Weight Bearing Restrictions: No      Mobility  Bed Mobility Overal bed mobility: Independent                Transfers Overall transfer level: Needs assistance Equipment used: None Transfers: Sit to/from Stand Sit to Stand: Min guard            Ambulation/Gait Ambulation/Gait assistance: Min assist Ambulation Distance (Feet): 50 Feet Assistive device: None Gait Pattern/deviations: Step-through pattern;Antalgic   Gait velocity interpretation: <1.8 ft/sec, indicative of risk for recurrent falls General Gait Details: Pt is antalgic with decreased balance.  She seems very anxious to be up and ambulating - which may be contributing to her decreased balance.    Stairs            Wheelchair Mobility    Modified Rankin (Stroke Patients Only)       Balance Overall balance assessment: Needs assistance Sitting-balance support: Bilateral upper extremity supported Sitting balance-Leahy Scale: Good     Standing balance support: Single extremity supported Standing balance-Leahy Scale: Poor                               Pertinent Vitals/Pain Pain Assessment: No/denies pain    Home Living   Living Arrangements: Alone Available Help at Discharge: Family;Available PRN/intermittently (sister's - about every other day. ) Type of Home: House (but she is renting.) Home Access: Stairs to enter   [employer] of Steps: 2 steps.  Home Layout: One level Home Equipment: None      Prior Function Level of Independence: Independent  Comments: son assists her with running errands.  Pt will go with him to run the errands.      Hand Dominance   Dominant Hand: Right    Extremity/Trunk Assessment   Upper Extremity Assessment: Overall WFL for tasks assessed           Lower Extremity Assessment: Overall WFL for tasks assessed         Communication   Communication: No difficulties  Cognition Arousal/Alertness: Awake/alert Behavior During Therapy: Flat affect                         General Comments      Exercises     Assessment/Plan    PT Assessment Patient needs continued PT services  PT Problem List Decreased strength;Decreased activity tolerance;Decreased balance;Decreased mobility;Decreased cognition;Decreased safety awareness;Decreased knowledge of precautions          PT Treatment Interventions Gait training;DME instruction;Functional mobility training;Therapeutic activities;Therapeutic exercise;Balance training;Patient/family education;Cognitive remediation    PT Goals (Current goals can be found in the Care Plan section)  Acute Rehab PT Goals Patient Stated Goal: To go home.  PT Goal Formulation: With patient/family Time For Goal Achievement: 04/18/16 Potential to Achieve Goals: Fair    Frequency Min 3X/week   Barriers to discharge Decreased caregiver support Pt lives alone    Co-evaluation               End of Session Equipment Utilized During Treatment: Gait belt Activity Tolerance: Patient tolerated treatment well Patient left: in chair;with call bell/phone within reach;with family/visitor present Nurse Communication: Mobility status (mobility sheet updated in pt's room. )    Functional Assessment Tool Used: [employer] "6-clicks"  Mobility: Walking and Moving Around Current Status [phone]): At least 20 percent but less than 40 percent impaired, limited or restricted Mobility: Walking and Moving Around Goal Status [phone]): At least 1 percent but less than 20 percent impaired, limited or restricted    Time: 0917-0940 PT Time Calculation (min) (ACUTE ONLY): 23 min   Charges:   PT Evaluation $PT Re-evaluation: 1 Procedure PT Treatments $Gait Training: 8-22 mins   PT G Codes:   PT G-Codes **NOT FOR INPATIENT CLASS** Functional Assessment Tool Used: [employer] "6-clicks"  Mobility: Walking and Moving Around Current Status [phone]): At least 20 percent but less than 40 percent impaired,  limited or restricted Mobility: Walking and Moving Around Goal Status [phone]): At least 1 percent but less than 20 percent impaired, limited or restricted    [name], PT, DPT X: [phone]

## 2016-04-11 NOTE — Progress Notes (Signed)
*  PRELIMINARY RESULTS* Echocardiogram 2D Echocardiogram has been performed.  Leavy Cella 04/11/2016, 2:24 PM

## 2016-04-11 NOTE — Clinical Social Work Note (Signed)
Clinical Social Work Assessment  Patient Details  Name: Karen Luna MRN: [id number] Date of Birth: 1931-06-25  Date of referral:  04/11/16               Reason for consult:  Discharge Planning                Permission sought to share information with:  Family Supports Permission granted to share information::  Yes, Verbal Permission Granted  Name::     Surveyor, quantity::     Relationship::  daughter-in-law  Contact Information:     Housing/Transportation Living arrangements for the past 2 months:  Apartment Source of Information:  Patient, Other (Comment Required) (daughter-in-law) Patient Interpreter Needed:  None Criminal Activity/Legal Involvement Pertinent to Current Situation/Hospitalization:  No - Comment as needed Significant Relationships:  Adult Children Lives with:  Self Do you feel safe going back to the place where you live?  Yes Need for family participation in patient care:  Yes (Comment)  Care giving concerns:  Pt lives alone. Family concerned about pt managing independently now.    Social Worker assessment / plan:  CSW met with pt and pt's daughter-in-law, Karen Luna at bedside with pt's permission. Pt did not participate much in conversation and requested that CSW speak with [facility]. Karen Luna shared that pt has been living alone and managing independently with assistance with medications. Since admission, they have seen a difference in pt and do not feel she is safe to be at home alone. PT evaluated pt and recommend supervision vs ALF. CSW discussed ALF and Karen Luna is concerned that pt may not adjust well because she wants to be at home and she is also particular that any caregiver be a female. CSW shared that this could not be guaranteed. Provided ALF list and offered placement from hospital. Aware of Medicaid at ALF placement and Karen Luna states that they may consider taking pt home with family rotating staying with her until they see if pt improves or not. CSW  suggested if they went home, that a home health social worker could follow pt to assist from home. Karen Luna agreeable. She will talk to her husband (pt's son, Karen Luna) this evening as he is at work and unavailable at this time. CSW will sign off.   Employment status:  Retired Nurse, adult PT Recommendations:  [address] [place] / Referral to community resources:  Other (Comment Required) (ALF list)  Patient/Family's Response to care:  Pt's daughter-in-law plans to share information with pt's son and they will notify CSW if ALF is needed. However, at this point they anticipate return home with family.   Patient/Family's Understanding of and Emotional Response to Diagnosis, Current Treatment, and Prognosis:  Pt's daughter-in-law appears to be aware of admission diagnosis and treatment plan.   Emotional Assessment Appearance:  Appears stated age Attitude/Demeanor/Rapport:  Other (Cooperative) Affect (typically observed):  Accepting Orientation:  Oriented to Self, Oriented to Place Alcohol / Substance use:  Not Applicable Psych involvement (Current and /or in the community):  No (Comment)  Discharge Needs  Concerns to be addressed:  Discharge Planning Concerns Readmission within the last 30 days:  No Current discharge risk:  Lives alone Barriers to Discharge:  No Barriers Identified   Karen Luna, Karen Luna 04/11/2016, 1:22 PM [phone]

## 2016-04-11 NOTE — Progress Notes (Signed)
PROGRESS NOTE    Karen Luna  [id number] DOB: 12-Apr-1932 DOA: 04/08/2016 PCP: [name], MD   Brief Narrative: 82 y.o. female with medical history significant of HIV on day viral medications, hypertension, chronic kidney disease is stage III, recently admitted for confusion when patient was discharged with Ceftin for the treatment of UTI brought in by patient's son for the evaluation of confusion.   Subjective: Patient in bed, denies any headache, no chest or abdominal pain, still feels slightly lightheaded, no focal weakness.   Assessment & Plan:   #Acute metabolic encephalopathy in the setting of UTI: Treated with IV antibiotics and fluid. Improvement in mental status today. She has no focal neurological deficit. CT nonacute but suggestive of chronic small vessel disease. PT and OT evaluated the patient.  #Syncopal episode on 04/10/2016. Not orthostatic, check EKG and echocardiogram, monitor troponin, PT eval. Monitor on telemetry.   #Acute cystitis without hematuria: Continue IV ceftriaxone. Follow-up culture results before deciding oral antibiotics on discharge. Recently patient was discharged with oral Ceftin without improvement. [place] inconclusive this time.  #Essential hypertension: Resume home medication. Monitor blood pressure. Hold hydrochlorothiazide.  #Chronic kidney disease is stage III: Serum creatinine level around baseline. Monitor BMP. Avoid nephrotoxins.   # Human immunodeficiency virus (HIV) disease ([place]): Resume antiviral medications.  # Anemia of chronic disease: Hemoglobin dropped mildly due to dilution.    DVT prophylaxis: Lovenox subcutaneous Code Status: Full code Family Communication: Discussed with the patient's sister at bedside Disposition Plan: Likely discharge home tomorrow.  Consultants:   None  Procedures: None Antimicrobials: Ceftriaxone   Objective: Vitals:   04/10/16 0500 04/10/16 2024 04/10/16 2047 04/11/16  0500  BP: 134/61  (!) 159/54 (!) 119/45  Pulse: 62  83 64  Resp: 18  18 18   Temp: 98 F (36.7 C)  97.9 F (36.6 C) 98.2 F (36.8 C)  TempSrc: Oral  Oral Oral  SpO2: 100% 98% 98% 99%  Weight:      Height:        Intake/Output Summary (Last 24 hours) at 04/11/16 1040 Last data filed at 04/10/16 1900  Gross per 24 hour  Intake              480 ml  Output                0 ml  Net              480 ml   Filed Weights   04/08/16 0517 04/08/16 0958  Weight: 67.1 kg (148 lb) 66.2 kg (146 lb)    Examination:  General exam: Pleasant elderly female lying on bed, Appears calm and comfortable  Respiratory system: Clear to auscultation. Respiratory effort normal. No wheezing or crackle Cardiovascular system: S1 & S2 heard, RRR.  No pedal edema. Gastrointestinal system: Abdomen is nondistended, soft and nontender. Normal bowel sounds heard. Central nervous system: Alert and oriented. No focal neurological deficits. Extremities: Symmetric 5 x 5 power. Skin: No rashes, lesions or ulcers Psychiatry: Judgement and insight appear normal. Mood & affect appropriate.     Data Reviewed: I have personally reviewed following labs and imaging studies  CBC:  Recent Labs Lab 04/08/16 0558 04/09/16 0426 04/10/16 0433  WBC 7.9 5.5 5.6  NEUTROABS 5.7  --   --   HGB 9.3* 7.6* 9.3*  HCT 27.7* 23.7* 28.1*  MCV 106.9* 108.7* 108.1*  PLT 411* 399 [id number]*   Basic Metabolic Panel:  Recent Labs Lab 04/08/16 0558 04/09/16  0426  NA 140 140  K 3.8 4.3  CL 109 110  CO2 22 24  GLUCOSE 113* 83  BUN 26* 21*  CREATININE 1.57* 1.51*  CALCIUM 10.5* 9.1   GFR: Estimated Creatinine Clearance: 30 mL/min (by C-G formula based on SCr of 1.51 mg/dL (H)). Liver Function Tests:  Recent Labs Lab 04/08/16 0558  AST 22  ALT 15  ALKPHOS 53  BILITOT 0.3  PROT 8.1  ALBUMIN 4.3   No results for input(s): LIPASE, AMYLASE in the last 168 hours.  Recent Labs Lab 04/08/16 0558  AMMONIA 28    Coagulation Profile: No results for input(s): INR, PROTIME in the last 168 hours. Cardiac Enzymes:  Recent Labs Lab 04/10/16 1541  TROPONINI <0.03   BNP (last 3 results) No results for input(s): PROBNP in the last 8760 hours. HbA1C: No results for input(s): HGBA1C in the last 72 hours. CBG: No results for input(s): GLUCAP in the last 168 hours. Lipid Profile: No results for input(s): CHOL, HDL, LDLCALC, TRIG, CHOLHDL, LDLDIRECT in the last 72 hours. Thyroid Function Tests: No results for input(s): TSH, T4TOTAL, FREET4, T3FREE, THYROIDAB in the last 72 hours. Anemia Panel: No results for input(s): VITAMINB12, FOLATE, FERRITIN, TIBC, IRON, RETICCTPCT in the last 72 hours. Sepsis Labs: No results for input(s): PROCALCITON, LATICACIDVEN in the last 168 hours.  Recent Results (from the past 240 hour(s))  Urine culture     Status: Abnormal   Collection Time: 04/08/16  5:31 AM  Result Value Ref Range Status   Specimen Description URINE, CATHETERIZED  Final   Special Requests Normal  Final   Culture MULTIPLE SPECIES PRESENT, SUGGEST RECOLLECTION (A)  Final   Report Status 04/10/2016 FINAL  Final     Radiology Studies: Ct Head Wo Contrast  Result Date: 04/10/2016 CLINICAL DATA:  Syncope with fall this morning. EXAM: CT HEAD WITHOUT CONTRAST TECHNIQUE: Contiguous axial images were obtained from the base of the skull through the vertex without intravenous contrast. COMPARISON:  Head CT 03/24/2016 FINDINGS: Brain: No acute intracranial hemorrhage. No focal mass lesion. No CT evidence of acute infarction. No midline shift or mass effect. No hydrocephalus. Basilar cisterns are patent. There are periventricular and subcortical white matter hypodensities. Generalized cortical atrophy. Vascular: No hyperdense vessel or unexpected calcification. Skull: Normal. Negative for fracture or focal lesion. Sinuses/Orbits: No acute finding. Other: None. IMPRESSION: 1. No acute intracranial findings.  2. Extensive subcortical and periventricular white matter hypodensities. Electronically Signed   By: Suzy Bouchard M.D.   On: 04/10/2016 16:40   Scheduled Meds: . abacavir-dolutegravir-lamiVUDine  1 tablet Oral Daily  . amLODipine  10 mg Oral Daily  . cefTRIAXone (ROCEPHIN)  IV  1 g Intravenous Q24H  . enoxaparin (LOVENOX) injection  30 mg Subcutaneous Q24H  . ferrous sulfate  325 mg Oral Q breakfast  . hydrALAZINE  100 mg Oral TID  . irbesartan  300 mg Oral Daily   Continuous Infusions:    LOS: 2 days    Thurnell Lose, MD [facility] Pager [phone]  If 7PM-7AM, please contact night-coverage [email or web address] Password [email or web address] 04/11/2016, 10:40 AM

## 2016-04-11 NOTE — Care Management (Signed)
Patient re-assessed by PT. Recommendation is for 24 hour supervision or ALF. Family is wanting for patient to go home and they will arrange for more supervision at home. HH RN already ordered, will add SW and aide. SW can help patient if she is ready to accept ALF in the future. LInda of [facility] notified, numbers given for DIL, Everlene Farrier who wants to be present for [facility] assessments. Patient would like all females from [facility]. Vaughan Basta of [facility] notified.

## 2016-04-11 NOTE — Progress Notes (Signed)
**Note De-Identified  Obfuscation** EKG complete; abnormal results reported to RN

## 2016-04-12 ENCOUNTER — Telehealth: Payer: Self-pay | Admitting: Internal Medicine

## 2016-04-12 NOTE — Telephone Encounter (Signed)
APT. REMINDER CALL, NO ANSWER, NO VOICEMAIL °

## 2016-04-12 NOTE — Discharge Instructions (Signed)
Follow with Primary MD [name], MD in 7 days   Get CBC, CMP, 2 view Chest X ray checked  by Primary MD or SNF MD in 5-7 days ( we routinely change or add medications that can affect your baseline labs and fluid status, therefore we recommend that you get the mentioned basic workup next visit with your PCP, your PCP may decide not to get them or add new tests based on their clinical decision)   Activity: As tolerated with Full fall precautions use walker/cane & assistance as needed   Disposition Home     Diet:  Heart Healthy   with feeding assistance and aspiration precautions.  For Heart failure patients - Check your Weight same time everyday, if you gain over 2 pounds, or you develop in leg swelling, experience more shortness of breath or chest pain, call your Primary MD immediately. Follow Cardiac Low Salt Diet and 1.5 lit/day fluid restriction.   On your next visit with your primary care physician please Get Medicines reviewed and adjusted.   Please request your Prim.MD to go over all Hospital Tests and Procedure/Radiological results at the follow up, please get all Hospital records sent to your Prim MD by signing hospital release before you go home.   If you experience worsening of your admission symptoms, develop shortness of breath, life threatening emergency, suicidal or homicidal thoughts you must seek medical attention immediately by calling 911 or calling your MD immediately  if symptoms less severe.  You Must read complete instructions/literature along with all the possible adverse reactions/side effects for all the Medicines you take and that have been prescribed to you. Take any new Medicines after you have completely understood and accpet all the possible adverse reactions/side effects.   Do not drive, operate heavy machinery, perform activities at heights, swimming or participation in water activities or provide baby sitting services if your were admitted for  syncope or siezures until you have seen by Primary MD or a Neurologist and advised to do so again.  Do not drive when taking Pain medications.    Do not take more than prescribed Pain, Sleep and Anxiety Medications  Special Instructions: If you have smoked or chewed Tobacco  in the last 2 yrs please stop smoking, stop any regular Alcohol  and or any Recreational drug use.  Wear Seat belts while driving.   Please note  You were cared for by a hospitalist during your hospital stay. If you have any questions about your discharge medications or the care you received while you were in the hospital after you are discharged, you can call the unit and asked to speak with the hospitalist on call if the hospitalist that took care of you is not available. Once you are discharged, your primary care physician will handle any further medical issues. Please note that NO REFILLS for any discharge medications will be authorized once you are discharged, as it is imperative that you return to your primary care physician (or establish a relationship with a primary care physician if you do not have one) for your aftercare needs so that they can reassess your need for medications and monitor your lab values.

## 2016-04-12 NOTE — Discharge Summary (Signed)
Karen Luna [id number] DOB: [birth year] DOA: 04/08/2016  PCP: [name], MD  Admit date: 04/08/2016  Discharge date: 04/12/2016  Admitted From: Home   Disposition:  Home   Recommendations for Outpatient Follow-up:   Follow up with PCP in 1-2 weeks  PCP Please obtain BMP/CBC, 2 view CXR in 1week,  (see Discharge instructions)   PCP Please follow up on the following pending results: None   Home Health: HHPT-RN   Equipment/Devices: 5 wheel walker  Consultations: None Discharge Condition: Fair   CODE STATUS: Full   Diet Recommendation:  Heart Healthy feeding assistance and aspiration precautions   Chief Complaint  Patient presents with  . Altered Mental Status     Brief history of present illness from the day of admission and additional interim summary    82 y.o.femalewith medical history significant of HIV on day viral medications, hypertension, chronic kidney disease is stage III, recently admitted for confusion when patient was discharged with Ceftin for the treatment of UTI broughtin by patient's son for the evaluation of confusion  Hospital issues addressed     UTI-induced acute metabolic encephalopathy. Much improved. Patient likely close to her baseline, cultures were noncontributory due to poor collection sample, as per previous M.D. patient will finish her oral antibiotic regimen coming Sunday. Thereafter follow with PCP. She is symptom free this morning and eager to go home.  Episode of syncope on 04/10/2016. She is not orthostatic, stable EKG, troponin negative, echocardiogram stable, she was stable on telemetry. Completely symptom free. We will discharge home with follow-up with PCP.  Essential hypertension. Continue home regimen.  CKD stage III. Creatinine at baseline.  Request PCP to recheck BMP and CBC next visit.  History of HIV. Resume home regimen of Antiretroviral medications.  Anemia of chronic disease. Stable.    Discharge diagnosis     Active Problems:   Human immunodeficiency virus (HIV) disease ([place])   Chronic kidney disease, stage 3   Anemia of chronic disease   Acute encephalopathy   Urinary tract infection without hematuria   Confusion   Passed out    Discharge instructions    Discharge Instructions    Call MD for:  difficulty breathing, headache or visual disturbances    Complete by:  As directed    Call MD for:  extreme fatigue    Complete by:  As directed    Call MD for:  hives    Complete by:  As directed    Call MD for:  persistant dizziness or light-headedness    Complete by:  As directed    Call MD for:  persistant nausea and vomiting    Complete by:  As directed    Call MD for:  temperature >100.4    Complete by:  As directed    Diet - low sodium heart healthy    Complete by:  As directed    Discharge instructions    Complete by:  As directed    Follow with Primary MD [name], MD  in 7 days   Get CBC, CMP, 2 view Chest X ray checked  by Primary MD or SNF MD in 5-7 days ( we routinely change or add medications that can affect your baseline labs and fluid status, therefore we recommend that you get the mentioned basic workup next visit with your PCP, your PCP may decide not to get them or add new tests based on their clinical decision)   Activity: As tolerated with Full fall precautions use walker/cane & assistance as needed   Disposition Home     Diet:  Heart Healthy   with feeding assistance and aspiration precautions.  For Heart failure patients - Check your Weight same time everyday, if you gain over 2 pounds, or you develop in leg swelling, experience more shortness of breath or chest pain, call your Primary MD immediately. Follow Cardiac Low Salt Diet and 1.5 lit/day fluid restriction.   On  your next visit with your primary care physician please Get Medicines reviewed and adjusted.   Please request your Prim.MD to go over all Hospital Tests and Procedure/Radiological results at the follow up, please get all Hospital records sent to your Prim MD by signing hospital release before you go home.   If you experience worsening of your admission symptoms, develop shortness of breath, life threatening emergency, suicidal or homicidal thoughts you must seek medical attention immediately by calling 911 or calling your MD immediately  if symptoms less severe.  You Must read complete instructions/literature along with all the possible adverse reactions/side effects for all the Medicines you take and that have been prescribed to you. Take any new Medicines after you have completely understood and accpet all the possible adverse reactions/side effects.   Do not drive, operate heavy machinery, perform activities at heights, swimming or participation in water activities or provide baby sitting services if your were admitted for syncope or siezures until you have seen by Primary MD or a Neurologist and advised to do so again.  Do not drive when taking Pain medications.    Do not take more than prescribed Pain, Sleep and Anxiety Medications  Special Instructions: If you have smoked or chewed Tobacco  in the last 2 yrs please stop smoking, stop any regular Alcohol  and or any Recreational drug use.  Wear Seat belts while driving.   Please note  You were cared for by a hospitalist during your hospital stay. If you have any questions about your discharge medications or the care you received while you were in the hospital after you are discharged, you can call the unit and asked to speak with the hospitalist on call if the hospitalist that took care of you is not available. Once you are discharged, your primary care physician will handle any further medical issues. Please note that NO REFILLS for any  discharge medications will be authorized once you are discharged, as it is imperative that you return to your primary care physician (or establish a relationship with a primary care physician if you do not have one) for your aftercare needs so that they can reassess your need for medications and monitor your lab values.   Increase activity slowly    Complete by:  As directed    Increase activity slowly    Complete by:  As directed       Discharge Medications     Medication List    TAKE these medications   abacavir-dolutegravir-lamiVUDine 600-50-300 MG tablet Commonly known as:  TRIUMEQ Take 1 tablet  by mouth daily.   amLODipine 10 MG tablet Commonly known as:  NORVASC Take 1 tablet (10 mg total) by mouth daily.   cefadroxil 500 MG capsule Commonly known as:  DURICEF Take 1 capsule (500 mg total) by mouth 2 (two) times daily.   ferrous sulfate 325 (65 FE) MG tablet Take 1 tablet (325 mg total) by mouth daily with breakfast.   hydrALAZINE 100 MG tablet Commonly known as:  APRESOLINE Take 1 tablet (100 mg total) by mouth 3 (three) times daily.   valsartan-hydrochlorothiazide 320-25 MG tablet Commonly known as:  DIOVAN-HCT Take 1 tablet by mouth daily.            Durable Medical Equipment        Start     Ordered   04/12/16 [phone]  For home use only DME Walker rolling  Once    Comments:  5 wheel  Question:  Patient needs a walker to treat with the following condition  Answer:  Weakness   04/12/16 0643      Follow-up Information    [name], MD Follow up on 04/13/2016.   Specialty:  Internal Medicine Why:  at 3:15 pm Contact information: [address] [place] [address] [phone]        Scharlene Gloss, MD Follow up.   Specialty:  Infectious Diseases Contact information: [address] [address] [phone]           Major procedures and Radiology Reports - PLEASE review detailed and final reports thoroughly   -      TTE  Left ventricle: The cavity size was normal. Wall thickness was   normal. Systolic function was vigorous. The estimated ejection   fraction was in the range of 65% to 70%. Wall motion was normal;   there were no regional wall motion abnormalities. Features are   consistent with a pseudonormal left ventricular filling pattern,   with concomitant abnormal relaxation and increased filling   pressure (grade 2 diastolic dysfunction). Doppler parameters are   consistent with high ventricular filling pressure. - Aortic valve: Mildly calcified annulus. Trileaflet; mildly   thickened leaflets. Valve area (VTI): 1.59 cm^2. Valve area   (Vmax): 1.79 cm^2. - Left atrium: The atrium was severely dilated. - Right atrium: The atrium was moderately dilated. - Atrial septum: No defect or patent foramen ovale was identified. - Pulmonary arteries: Systolic pressure was mildly increased. PA   peak pressure: 39 mm Hg (S). - Technically adequate study.     Dg Chest 2 View  Result Date: 03/24/2016 CLINICAL DATA:  Altered mental status EXAM: CHEST  2 VIEW COMPARISON:  None. FINDINGS: The heart size and mediastinal contours are within normal limits. Both lungs are clear. The visualized skeletal structures are unremarkable. IMPRESSION: No active cardiopulmonary disease. Electronically Signed   By: Inez Catalina M.D.   On: 03/24/2016 07:35   Ct Head Wo Contrast  Result Date: 04/10/2016 CLINICAL DATA:  Syncope with fall this morning. EXAM: CT HEAD WITHOUT CONTRAST TECHNIQUE: Contiguous axial images were obtained from the base of the skull through the vertex without intravenous contrast. COMPARISON:  Head CT 03/24/2016 FINDINGS: Brain: No acute intracranial hemorrhage. No focal mass lesion. No CT evidence of acute infarction. No midline shift or mass effect. No hydrocephalus. Basilar cisterns are patent. There are periventricular and subcortical white matter hypodensities. Generalized cortical atrophy.  Vascular: No hyperdense vessel or unexpected calcification. Skull: Normal. Negative for fracture or focal lesion. Sinuses/Orbits: No  acute finding. Other: None. IMPRESSION: 1. No acute intracranial findings. 2. Extensive subcortical and periventricular white matter hypodensities. Electronically Signed   By: Suzy Bouchard M.D.   On: 04/10/2016 16:40   Ct Head Wo Contrast  Result Date: 03/24/2016 CLINICAL DATA:  82 year old female with altered mental status for 1 week. Initial encounter. EXAM: CT HEAD WITHOUT CONTRAST TECHNIQUE: Contiguous axial images were obtained from the base of the skull through the vertex without intravenous contrast. COMPARISON:  Head CT 02/08/2016 FINDINGS: Brain: Stable cerebral volume. Patchy bilateral cerebral white matter hypodensity appears stable. No midline shift, ventriculomegaly, mass effect, evidence of mass lesion, intracranial hemorrhage or evidence of cortically based acute infarction. No cortical encephalomalacia identified. Stable focal hypodensity in the right caudate nucleus. Vascular: Calcified atherosclerosis at the skull base. No suspicious intracranial vascular hyperdensity. Skull: No acute osseous abnormality identified. Sinuses/Orbits: Chronic right maxillary and sphenoid sinusitis are stable. Other Visualized paranasal sinuses and mastoids are stable and well pneumatized. Other: No acute orbit or scalp soft tissue finding. IMPRESSION: Stable noncontrast CT appearance of the brain since 02/08/2016 with cerebral white matter and right caudate nucleus hypodensity suggesting chronic small vessel disease. Electronically Signed   By: Genevie Ann M.D.   On: 03/24/2016 07:46    Micro Results     Recent Results (from the past 240 hour(s))  Urine culture     Status: Abnormal   Collection Time: 04/08/16  5:31 AM  Result Value Ref Range Status   Specimen Description URINE, CATHETERIZED  Final   Special Requests Normal  Final   Culture MULTIPLE SPECIES PRESENT,  SUGGEST RECOLLECTION (A)  Final   Report Status 04/10/2016 FINAL  Final    Today   Subjective    Lindaann Pascal today has no headache,no chest abdominal pain,no new weakness tingling or numbness, feels much better wants to go home today.     Objective   Blood pressure (!) 125/52, pulse 69, temperature 97.6 F (36.4 C), temperature source Oral, resp. rate 18, height 5\' 9"  (1.753 m), weight 66.2 kg (146 lb), SpO2 98 %.   Intake/Output Summary (Last 24 hours) at 04/12/16 1115 Last data filed at 04/12/16 0900  Gross per 24 hour  Intake              720 ml  Output                0 ml  Net              720 ml    Exam Awake Alert, Oriented x 3, No new F.N deficits, Normal affect [place].AT,PERRAL Supple Neck,No JVD, No cervical lymphadenopathy appriciated.  Symmetrical Chest wall movement, Good air movement bilaterally, CTAB RRR,No Gallops,Rubs or new Murmurs, No Parasternal Heave +ve B.Sounds, Abd Soft, Non tender, No organomegaly appriciated, No rebound -guarding or rigidity. No Cyanosis, Clubbing or edema, No new Rash or bruise   Data Review   CBC w Diff: Lab Results  Component Value Date   WBC 5.6 04/10/2016   HGB 9.3 (L) 04/10/2016   HCT 28.1 (L) 04/10/2016   HCT 33.7 (L) 02/25/2015   PLT 446 (H) 04/10/2016   PLT 406 (H) 02/25/2015   LYMPHOPCT 17 04/08/2016   MONOPCT 10 04/08/2016   EOSPCT 1 04/08/2016   BASOPCT 0 04/08/2016    CMP: Lab Results  Component Value Date   NA 140 04/09/2016   NA 138 02/25/2015   K 4.3 04/09/2016   CL 110 04/09/2016   CO2 24 04/09/2016  BUN 21 (H) 04/09/2016   BUN 16 02/25/2015   CREATININE 1.51 (H) 04/09/2016   CREATININE 1.41 (H) 01/10/2016   PROT 8.1 04/08/2016   PROT 7.5 02/25/2015   ALBUMIN 4.3 04/08/2016   ALBUMIN 4.0 02/25/2015   BILITOT 0.3 04/08/2016   BILITOT <0.2 02/25/2015   ALKPHOS 53 04/08/2016   AST 22 04/08/2016   ALT 15 04/08/2016  .   Total Time in preparing paper work, data evaluation and todays exam -  35 minutes  Thurnell Lose M.D on 04/12/2016 at 11:15 AM  [facility]   Office  [phone]

## 2016-04-12 NOTE — Progress Notes (Signed)
Patient discharged home with all personal belongings. IV removed and site intact.

## 2016-04-13 ENCOUNTER — Ambulatory Visit: Payer: Self-pay

## 2016-04-14 DIAGNOSIS — G9341 Metabolic encephalopathy: Secondary | ICD-10-CM | POA: Diagnosis not present

## 2016-04-14 DIAGNOSIS — N390 Urinary tract infection, site not specified: Secondary | ICD-10-CM | POA: Diagnosis not present

## 2016-04-14 DIAGNOSIS — I129 Hypertensive chronic kidney disease with stage 1 through stage 4 chronic kidney disease, or unspecified chronic kidney disease: Secondary | ICD-10-CM | POA: Diagnosis not present

## 2016-04-14 DIAGNOSIS — N183 Chronic kidney disease, stage 3 (moderate): Secondary | ICD-10-CM | POA: Diagnosis not present

## 2016-04-14 DIAGNOSIS — D638 Anemia in other chronic diseases classified elsewhere: Secondary | ICD-10-CM | POA: Diagnosis not present

## 2016-04-14 DIAGNOSIS — B20 Human immunodeficiency virus [HIV] disease: Secondary | ICD-10-CM | POA: Diagnosis not present

## 2016-04-14 DIAGNOSIS — I872 Venous insufficiency (chronic) (peripheral): Secondary | ICD-10-CM | POA: Diagnosis not present

## 2016-04-16 DIAGNOSIS — G9341 Metabolic encephalopathy: Secondary | ICD-10-CM | POA: Diagnosis not present

## 2016-04-16 DIAGNOSIS — D638 Anemia in other chronic diseases classified elsewhere: Secondary | ICD-10-CM | POA: Diagnosis not present

## 2016-04-16 DIAGNOSIS — N390 Urinary tract infection, site not specified: Secondary | ICD-10-CM | POA: Diagnosis not present

## 2016-04-16 DIAGNOSIS — I872 Venous insufficiency (chronic) (peripheral): Secondary | ICD-10-CM | POA: Diagnosis not present

## 2016-04-16 DIAGNOSIS — N183 Chronic kidney disease, stage 3 (moderate): Secondary | ICD-10-CM | POA: Diagnosis not present

## 2016-04-16 DIAGNOSIS — I129 Hypertensive chronic kidney disease with stage 1 through stage 4 chronic kidney disease, or unspecified chronic kidney disease: Secondary | ICD-10-CM | POA: Diagnosis not present

## 2016-04-16 DIAGNOSIS — B20 Human immunodeficiency virus [HIV] disease: Secondary | ICD-10-CM | POA: Diagnosis not present

## 2016-04-17 DIAGNOSIS — G9341 Metabolic encephalopathy: Secondary | ICD-10-CM | POA: Diagnosis not present

## 2016-04-17 DIAGNOSIS — I872 Venous insufficiency (chronic) (peripheral): Secondary | ICD-10-CM | POA: Diagnosis not present

## 2016-04-17 DIAGNOSIS — B20 Human immunodeficiency virus [HIV] disease: Secondary | ICD-10-CM | POA: Diagnosis not present

## 2016-04-17 DIAGNOSIS — N390 Urinary tract infection, site not specified: Secondary | ICD-10-CM | POA: Diagnosis not present

## 2016-04-17 DIAGNOSIS — I129 Hypertensive chronic kidney disease with stage 1 through stage 4 chronic kidney disease, or unspecified chronic kidney disease: Secondary | ICD-10-CM | POA: Diagnosis not present

## 2016-04-17 DIAGNOSIS — N183 Chronic kidney disease, stage 3 (moderate): Secondary | ICD-10-CM | POA: Diagnosis not present

## 2016-04-17 DIAGNOSIS — D638 Anemia in other chronic diseases classified elsewhere: Secondary | ICD-10-CM | POA: Diagnosis not present

## 2016-04-18 ENCOUNTER — Ambulatory Visit (INDEPENDENT_AMBULATORY_CARE_PROVIDER_SITE_OTHER): Payer: Commercial Managed Care - HMO | Admitting: Internal Medicine

## 2016-04-18 ENCOUNTER — Ambulatory Visit: Payer: Self-pay

## 2016-04-18 VITALS — BP 159/61 | HR 78 | Temp 98.2°F | Ht 68.0 in | Wt 153.7 lb

## 2016-04-18 DIAGNOSIS — Z09 Encounter for follow-up examination after completed treatment for conditions other than malignant neoplasm: Secondary | ICD-10-CM

## 2016-04-18 DIAGNOSIS — Z87440 Personal history of urinary (tract) infections: Secondary | ICD-10-CM | POA: Diagnosis not present

## 2016-04-18 DIAGNOSIS — N3000 Acute cystitis without hematuria: Secondary | ICD-10-CM

## 2016-04-18 DIAGNOSIS — G9341 Metabolic encephalopathy: Secondary | ICD-10-CM | POA: Diagnosis not present

## 2016-04-18 DIAGNOSIS — F0390 Unspecified dementia without behavioral disturbance: Secondary | ICD-10-CM | POA: Diagnosis not present

## 2016-04-18 DIAGNOSIS — D638 Anemia in other chronic diseases classified elsewhere: Secondary | ICD-10-CM | POA: Diagnosis not present

## 2016-04-18 DIAGNOSIS — I872 Venous insufficiency (chronic) (peripheral): Secondary | ICD-10-CM | POA: Diagnosis not present

## 2016-04-18 DIAGNOSIS — N390 Urinary tract infection, site not specified: Secondary | ICD-10-CM | POA: Diagnosis not present

## 2016-04-18 DIAGNOSIS — I129 Hypertensive chronic kidney disease with stage 1 through stage 4 chronic kidney disease, or unspecified chronic kidney disease: Secondary | ICD-10-CM | POA: Diagnosis not present

## 2016-04-18 DIAGNOSIS — N183 Chronic kidney disease, stage 3 (moderate): Secondary | ICD-10-CM | POA: Diagnosis not present

## 2016-04-18 DIAGNOSIS — B20 Human immunodeficiency virus [HIV] disease: Secondary | ICD-10-CM | POA: Diagnosis not present

## 2016-04-18 NOTE — Progress Notes (Signed)
CC: Memory issues  HPI:  Karen Luna is a 82 y.o. female with PMH as listed below who presents for hospital follow up management of UTI and memory issues. She is accompanied by her son who provides a lot of the history.  UTI: Patient admitted from 11/25-11/26 for acute encephalopathy thought secondary to UTI and treated with IV Ceftriaxone from 11/25-11/26 with improvement and completed antibiotics with Ceftin from 11/27-11/29 (urine culture not obtained that admission). She was again admitted from 12/10-12/14 for suspected UTI induced acute encephalopathy and treated with IV Ceftriaxone from 12/10-12/11 and transitioned to oral Cefadroxil to complete an additional 5 day course. Urine culture was not contributory due to poor specimen collection. CT head showed generalized cortical atrophy, periventricular and subcortical white matter hypodensities, without acute pathology. Patient completed course of antibiotics after discharge. Per her son, she is doing much better and near her baseline which apparently includes memory issues (short-term more than long-term). She denies any dysuria, burning on urination, frequency, hematuria, abdominal pain, chest pain, SOB, fevers, chills, nausea, or vomiting.  Dementia: Patient's son is concerned about her memory issues. He has noticed she has difficulty with short-term memory and becomes frustrated when she cannot recall things. He has noticed this mostly since her recent hospitalization, but on further questioning has noticed this in the preceding months as well. Patient lives alone and states she is able to do her daily activities including bathing and dressing herself. She passes her time by watching her "stories." She is able to state her full name and birth date. She knows she is in the hospital, but can only recall the city or state with prompting. She is able to recall the color and make of her car [employer]) when she lived in [place] 40 years  ago when asked by her son. Son states that she has PT coming to her house a few times a week and family helping on other days of the week. He is speaking to a Education officer, museum about further care for his mother and considering daytime geriatric programs such as [facility].  Past Medical History:  Diagnosis Date  . Anemia of chronic disease 07/06/2011  . Aortic atherosclerosis ([place]) 01/06/2015   Seen on CT scan, currently asymptomatic  . Cancer ([place])    uterine( per pt)  . Chronic kidney disease, stage 3 09/01/2008  . Chronic venous insufficiency 10/03/2012  . Essential hypertension 09/19/2006  . Heart murmur    Aortic area  . History of cervical cancer 08/11/2012   s/p hysterectomy, remote, specifics unknown   . Human immunodeficiency virus (HIV) disease ([place]) 09/12/2006  . 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 07/25/2012  . Low grade squamous intraepithelial lesion (LGSIL) on cervical Pap smear 08/06/2007   LGSIL: 08/06/2007, 11/05/2008, 05/11/2009 ASC-US: 05/27/2008, 08/31/2009, 05/24/2010, 05/23/2012 High Risk HPV: Detected   . Onychomycosis of left great toe 09/09/2015  . Overweight (BMI 25.0-29.9) 07/25/2012    Review of Systems:   Review of Systems  Constitutional: Negative for chills, diaphoresis and fever.  Respiratory: Negative for cough and shortness of breath.   Cardiovascular: Positive for leg swelling. Negative for chest pain.  Gastrointestinal: Negative for abdominal pain, nausea and vomiting.  Genitourinary: Negative for dysuria, frequency, hematuria and urgency.  Musculoskeletal: Negative for falls.  Neurological: Negative for loss of consciousness.  Psychiatric/Behavioral: Positive for memory loss.     Physical Exam:  Vitals:   04/18/16 1357  BP: (!) 159/61  Pulse: 78  Temp: 98.2  F (36.8 C)  TempSrc: Oral  SpO2: 100%  Weight: 153 lb 11.2 oz (69.7 kg)  Height: 5\' 8"  (1.727 m)   Physical Exam  Constitutional: No distress.  Cardiovascular: Normal rate and regular  rhythm.   Murmur heard. Pulses:      Radial pulses are 2+ on the right side, and 2+ on the left side.  3/6 systolic murmur loudest at the RUS border  Pulmonary/Chest: Effort normal. No respiratory distress. She has no wheezes. She has no rales.  Abdominal: Soft. She exhibits no distension. There is no tenderness. There is no guarding.  Musculoskeletal: She exhibits no tenderness.  +1 pitting edema bilateral lower extremities  Neurological: She is alert.  Oriented to person and knows she is at the hospital, can tell the city and state with prompting. Knows her birth date.  Skin: She is not diaphoretic.  Psychiatric: Cognition and memory are impaired. She exhibits abnormal recent memory.  Unable to recall three words   Clock Draw:     Assessment & Plan:   See Encounters Tab for problem based charting.  Patient discussed with Dr. Daryll Drown

## 2016-04-18 NOTE — Patient Instructions (Addendum)
It was a pleasure to meet you Karen Luna.  You were treated for a UTI in the hospital with a course of antibiotics.  I am glad you are feeling better now.  I think it is a good idea to try to get additional help during the day to help while you are having trouble with memory.  Please continue your current medications and call us for any further assistance.   Urinary Tract Infection, Adult A urinary tract infection (UTI) is an infection of any part of the urinary tract, which includes the kidneys, ureters, bladder, and urethra. These organs make, store, and get rid of urine in the body. UTI can be a bladder infection (cystitis) or kidney infection (pyelonephritis). What are the causes? This infection may be caused by fungi, viruses, or bacteria. Bacteria are the most common cause of UTIs. This condition can also be caused by repeated incomplete emptying of the bladder during urination. What increases the risk? This condition is more likely to develop if:  You ignore your need to urinate or hold urine for long periods of time.  You do not empty your bladder completely during urination.  You wipe back to front after urinating or having a bowel movement, if you are female.  You are uncircumcised, if you are female.  You are constipated.  You have a urinary catheter that stays in place (indwelling).  You have a weak defense (immune) system.  You have a medical condition that affects your bowels, kidneys, or bladder.  You have diabetes.  You take antibiotic medicines frequently or for long periods of time, and the antibiotics no longer work well against certain types of infections (antibiotic resistance).  You take medicines that irritate your urinary tract.  You are exposed to chemicals that irritate your urinary tract.  You are female. What are the signs or symptoms? Symptoms of this condition include:  Fever.  Frequent urination or passing small amounts of urine  frequently.  Needing to urinate urgently.  Pain or burning with urination.  Urine that smells bad or unusual.  Cloudy urine.  Pain in the lower abdomen or back.  Trouble urinating.  Blood in the urine.  Vomiting or being less hungry than normal.  Diarrhea or abdominal pain.  Vaginal discharge, if you are female. How is this diagnosed? This condition is diagnosed with a medical history and physical exam. You will also need to provide a urine sample to test your urine. Other tests may be done, including:  Blood tests.  Sexually transmitted disease (STD) testing. If you have had more than one UTI, a cystoscopy or imaging studies may be done to determine the cause of the infections. How is this treated? Treatment for this condition often includes a combination of two or more of the following:  Antibiotic medicine.  Other medicines to treat less common causes of UTI.  Over-the-counter medicines to treat pain.  Drinking enough water to stay hydrated. Follow these instructions at home:  Take over-the-counter and prescription medicines only as told by your health care provider.  If you were prescribed an antibiotic, take it as told by your health care provider. Do not stop taking the antibiotic even if you start to feel better.  Avoid alcohol, caffeine, tea, and carbonated beverages. They can irritate your bladder.  Drink enough fluid to keep your urine clear or pale yellow.  Keep all follow-up visits as told by your health care provider. This is important.  Make sure to:  Empty your  bladder often and completely. Do not hold urine for long periods of time.  Empty your bladder before and after sex.  Wipe from front to back after a bowel movement if you are female. Use each tissue one time when you wipe. Contact a health care provider if:  You have back pain.  You have a fever.  You feel nauseous or vomit.  Your symptoms do not get better after 3 days.  Your  symptoms go away and then return. Get help right away if:  You have severe back pain or lower abdominal pain.  You are vomiting and cannot keep down any medicines or water. This information is not intended to replace advice given to you by your health care provider. Make sure you discuss any questions you have with your health care provider. Document Released: 01/24/2005 Document Revised: 09/28/2015 Document Reviewed: 03/07/2015 Elsevier Interactive Patient Education  2017 [employer].

## 2016-04-18 NOTE — Assessment & Plan Note (Signed)
Patient admitted from 11/25-11/26 for acute encephalopathy thought secondary to UTI and treated with IV Ceftriaxone from 11/25-11/26 with improvement and completed antibiotics with Ceftin from 11/27-11/29 (urine culture not obtained that admission). She was again admitted from 12/10-12/14 for suspected UTI induced acute encephalopathy and treated with IV Ceftriaxone from 12/10-12/11 and transitioned to oral Cefadroxil to complete an additional 5 day course. Urine culture was not contributory due to poor specimen collection. CT head showed generalized cortical atrophy, periventricular and subcortical white matter hypodensities, without acute pathology. Patient completed course of antibiotics after discharge. Per her son, she is doing much better and near her baseline which apparently includes memory issues (short-term more than long-term). She denies any dysuria, burning on urination, frequency, hematuria, abdominal pain, chest pain, SOB, fevers, chills, nausea, or vomiting.  Patient completed to recent courses of antibiotics with improvement in her acute encephalopathy. She has residual memory issues suspect secondary to underlying dementia. She is otherwise asymptomatic. Patient's son advised on signs and symptoms to monitor for which may warrant further evaluation or treatment.

## 2016-04-18 NOTE — Assessment & Plan Note (Signed)
Patient's son is concerned about her memory issues. He has noticed she has difficulty with short-term memory and becomes frustrated when she cannot recall things. He has noticed this mostly since her recent hospitalization, but on further questioning has noticed this in the preceding months as well. Patient lives alone and states she is able to do her daily activities including bathing and dressing herself. She passes her time by watching her "stories." She is able to state her full name and birth date. She knows she is in the hospital, but can only recall the city or state with prompting. She is able to recall the color and make of her car [employer]) when she lived in [place] 40 years ago when asked by her son. Son states that she has PT coming to her house a few times a week and family helping on other days of the week. He is speaking to a Education officer, museum about further care for his mother and considering daytime geriatric programs such as PACE.  Mini-Cog performed in clinic. Patient unable to recall any of three words or complete the clock draw task (document under media tab) suggesting underlying dementia. Dementia may be related to Alzheimer's, Vascular disease, or HIV infection. Agree with son that she would be a good candidate for a program like PACE. He will let us know if he needs further assistance with this. Will defer initiation of Donepezil as she has a history of conduction abnormalities and bradycardia.

## 2016-04-20 DIAGNOSIS — I872 Venous insufficiency (chronic) (peripheral): Secondary | ICD-10-CM | POA: Diagnosis not present

## 2016-04-20 DIAGNOSIS — N183 Chronic kidney disease, stage 3 (moderate): Secondary | ICD-10-CM | POA: Diagnosis not present

## 2016-04-20 DIAGNOSIS — I129 Hypertensive chronic kidney disease with stage 1 through stage 4 chronic kidney disease, or unspecified chronic kidney disease: Secondary | ICD-10-CM | POA: Diagnosis not present

## 2016-04-20 DIAGNOSIS — D638 Anemia in other chronic diseases classified elsewhere: Secondary | ICD-10-CM | POA: Diagnosis not present

## 2016-04-20 DIAGNOSIS — N390 Urinary tract infection, site not specified: Secondary | ICD-10-CM | POA: Diagnosis not present

## 2016-04-20 DIAGNOSIS — B20 Human immunodeficiency virus [HIV] disease: Secondary | ICD-10-CM | POA: Diagnosis not present

## 2016-04-20 DIAGNOSIS — G9341 Metabolic encephalopathy: Secondary | ICD-10-CM | POA: Diagnosis not present

## 2016-04-24 ENCOUNTER — Other Ambulatory Visit: Payer: Self-pay | Admitting: Internal Medicine

## 2016-04-24 DIAGNOSIS — B20 Human immunodeficiency virus [HIV] disease: Secondary | ICD-10-CM

## 2016-04-25 DIAGNOSIS — N183 Chronic kidney disease, stage 3 (moderate): Secondary | ICD-10-CM | POA: Diagnosis not present

## 2016-04-25 DIAGNOSIS — I129 Hypertensive chronic kidney disease with stage 1 through stage 4 chronic kidney disease, or unspecified chronic kidney disease: Secondary | ICD-10-CM | POA: Diagnosis not present

## 2016-04-25 DIAGNOSIS — B20 Human immunodeficiency virus [HIV] disease: Secondary | ICD-10-CM | POA: Diagnosis not present

## 2016-04-25 DIAGNOSIS — D638 Anemia in other chronic diseases classified elsewhere: Secondary | ICD-10-CM | POA: Diagnosis not present

## 2016-04-25 DIAGNOSIS — I872 Venous insufficiency (chronic) (peripheral): Secondary | ICD-10-CM | POA: Diagnosis not present

## 2016-04-25 DIAGNOSIS — G9341 Metabolic encephalopathy: Secondary | ICD-10-CM | POA: Diagnosis not present

## 2016-04-25 DIAGNOSIS — N390 Urinary tract infection, site not specified: Secondary | ICD-10-CM | POA: Diagnosis not present

## 2016-04-25 NOTE — Progress Notes (Signed)
Internal Medicine Clinic Attending  Case discussed with Dr. Patel,Vishal soon after the resident saw the patient.  We reviewed the resident's history and exam and pertinent patient test results.  I agree with the assessment, diagnosis, and plan of care documented in the resident's note. 

## 2016-05-01 ENCOUNTER — Other Ambulatory Visit: Payer: Self-pay

## 2016-05-01 DIAGNOSIS — B20 Human immunodeficiency virus [HIV] disease: Secondary | ICD-10-CM

## 2016-05-01 MED ORDER — ABACAVIR-DOLUTEGRAVIR-LAMIVUD 600-50-300 MG PO TABS: 1.0000 | ORAL_TABLET | Freq: Every day | ORAL | 3 refills | Status: DC

## 2016-05-01 NOTE — Telephone Encounter (Signed)
abacavir-dolutegravir-lamiVUDine (TRIUMEQ) 600-50-300 MG tablet, refill request @ [facility] in [place], [place].

## 2016-05-02 ENCOUNTER — Encounter: Payer: Self-pay | Admitting: *Deleted

## 2016-05-07 ENCOUNTER — Other Ambulatory Visit: Payer: Self-pay | Admitting: *Deleted

## 2016-05-09 ENCOUNTER — Encounter: Payer: Self-pay | Admitting: *Deleted

## 2016-06-05 ENCOUNTER — Telehealth: Payer: Self-pay | Admitting: Internal Medicine

## 2016-06-05 DIAGNOSIS — I10 Essential (primary) hypertension: Secondary | ICD-10-CM

## 2016-06-08 NOTE — Telephone Encounter (Signed)
Patient already has an existing appt with primary on 07/05/16 @ 9:15 am.  It was made in January.

## 2016-06-13 ENCOUNTER — Emergency Department (HOSPITAL_COMMUNITY): Payer: Medicare HMO

## 2016-06-13 ENCOUNTER — Encounter (HOSPITAL_COMMUNITY): Payer: Self-pay | Admitting: *Deleted

## 2016-06-13 ENCOUNTER — Observation Stay (HOSPITAL_COMMUNITY)
Admission: EM | Admit: 2016-06-13 | Discharge: 2016-06-15 | Disposition: A | Discharge status: Home / Self Care | Payer: Medicare HMO | Attending: Oncology | Admitting: Oncology

## 2016-06-13 DIAGNOSIS — R634 Abnormal weight loss: Secondary | ICD-10-CM | POA: Insufficient documentation

## 2016-06-13 DIAGNOSIS — Z6822 Body mass index (BMI) 22.0-22.9, adult: Secondary | ICD-10-CM | POA: Diagnosis not present

## 2016-06-13 DIAGNOSIS — I130 Hypertensive heart and chronic kidney disease with heart failure and stage 1 through stage 4 chronic kidney disease, or unspecified chronic kidney disease: Secondary | ICD-10-CM | POA: Diagnosis not present

## 2016-06-13 DIAGNOSIS — I441 Atrioventricular block, second degree: Secondary | ICD-10-CM | POA: Diagnosis not present

## 2016-06-13 DIAGNOSIS — R262 Difficulty in walking, not elsewhere classified: Secondary | ICD-10-CM

## 2016-06-13 DIAGNOSIS — N183 Chronic kidney disease, stage 3 unspecified: Secondary | ICD-10-CM | POA: Diagnosis present

## 2016-06-13 DIAGNOSIS — R55 Syncope and collapse: Secondary | ICD-10-CM | POA: Diagnosis not present

## 2016-06-13 DIAGNOSIS — I5030 Unspecified diastolic (congestive) heart failure: Secondary | ICD-10-CM | POA: Insufficient documentation

## 2016-06-13 DIAGNOSIS — Z8541 Personal history of malignant neoplasm of cervix uteri: Secondary | ICD-10-CM | POA: Insufficient documentation

## 2016-06-13 DIAGNOSIS — R42 Dizziness and giddiness: Secondary | ICD-10-CM | POA: Diagnosis not present

## 2016-06-13 DIAGNOSIS — I700 Atherosclerosis of aorta: Secondary | ICD-10-CM | POA: Diagnosis present

## 2016-06-13 DIAGNOSIS — Z21 Asymptomatic human immunodeficiency virus [HIV] infection status: Secondary | ICD-10-CM | POA: Insufficient documentation

## 2016-06-13 DIAGNOSIS — I872 Venous insufficiency (chronic) (peripheral): Secondary | ICD-10-CM | POA: Diagnosis not present

## 2016-06-13 DIAGNOSIS — Z87891 Personal history of nicotine dependence: Secondary | ICD-10-CM | POA: Diagnosis not present

## 2016-06-13 DIAGNOSIS — F0390 Unspecified dementia without behavioral disturbance: Secondary | ICD-10-CM | POA: Insufficient documentation

## 2016-06-13 DIAGNOSIS — N390 Urinary tract infection, site not specified: Secondary | ICD-10-CM | POA: Diagnosis not present

## 2016-06-13 DIAGNOSIS — Z90710 Acquired absence of both cervix and uterus: Secondary | ICD-10-CM | POA: Diagnosis not present

## 2016-06-13 DIAGNOSIS — R0602 Shortness of breath: Secondary | ICD-10-CM | POA: Diagnosis not present

## 2016-06-13 DIAGNOSIS — N1832 Chronic kidney disease, stage 3b: Secondary | ICD-10-CM | POA: Diagnosis present

## 2016-06-13 DIAGNOSIS — R001 Bradycardia, unspecified: Principal | ICD-10-CM | POA: Insufficient documentation

## 2016-06-13 DIAGNOSIS — F329 Major depressive disorder, single episode, unspecified: Secondary | ICD-10-CM | POA: Diagnosis not present

## 2016-06-13 DIAGNOSIS — R627 Adult failure to thrive: Secondary | ICD-10-CM | POA: Insufficient documentation

## 2016-06-13 DIAGNOSIS — R05 Cough: Secondary | ICD-10-CM | POA: Diagnosis not present

## 2016-06-13 DIAGNOSIS — D638 Anemia in other chronic diseases classified elsewhere: Secondary | ICD-10-CM | POA: Diagnosis not present

## 2016-06-13 LAB — COMPREHENSIVE METABOLIC PANEL
ALBUMIN: 4.2 g/dL (ref 3.5–5.0)
ALT: 19 U/L (ref 14–54)
ANION GAP: 12 (ref 5–15)
AST: 20 U/L (ref 15–41)
Alkaline Phosphatase: 60 U/L (ref 38–126)
BUN: 22 mg/dL — AB (ref 6–20)
CHLORIDE: 105 mmol/L (ref 101–111)
CO2: 21 mmol/L — ABNORMAL LOW (ref 22–32)
Calcium: 10.3 mg/dL (ref 8.9–10.3)
Creatinine, Ser: 1.12 mg/dL — ABNORMAL HIGH (ref 0.44–1.00)
GFR calc Af Amer: 52 mL/min — ABNORMAL LOW (ref >60)
GFR, EST NON AFRICAN AMERICAN: 44 mL/min — AB (ref >60)
GLUCOSE: 112 mg/dL — AB (ref 65–99)
POTASSIUM: 4.2 mmol/L (ref 3.5–5.1)
Sodium: 138 mmol/L (ref 135–145)
Total Bilirubin: 0.4 mg/dL (ref 0.3–1.2)
Total Protein: 8 g/dL (ref 6.5–8.1)

## 2016-06-13 LAB — URINALYSIS, ROUTINE W REFLEX MICROSCOPIC
BILIRUBIN URINE: NEGATIVE
GLUCOSE, UA: NEGATIVE mg/dL
Hgb urine dipstick: NEGATIVE
KETONES UR: NEGATIVE mg/dL
NITRITE: NEGATIVE
PROTEIN: NEGATIVE mg/dL
Specific Gravity, Urine: 1.004 — ABNORMAL LOW (ref 1.005–1.030)
pH: 8 (ref 5.0–8.0)

## 2016-06-13 LAB — CBC WITH DIFFERENTIAL/PLATELET
BASOS ABS: 0 10*3/uL (ref 0.0–0.1)
BASOS PCT: 0 %
EOS PCT: 1 %
Eosinophils Absolute: 0.1 10*3/uL (ref 0.0–0.7)
HCT: 30.4 % — ABNORMAL LOW (ref 36.0–46.0)
Hemoglobin: 10.1 g/dL — ABNORMAL LOW (ref 12.0–15.0)
Lymphocytes Relative: 25 %
Lymphs Abs: 1.7 10*3/uL (ref 0.7–4.0)
MCH: 35.3 pg — ABNORMAL HIGH (ref 26.0–34.0)
MCHC: 33.2 g/dL (ref 30.0–36.0)
MCV: 106.3 fL — ABNORMAL HIGH (ref 78.0–100.0)
MONO ABS: 0.8 10*3/uL (ref 0.1–1.0)
Monocytes Relative: 12 %
Neutro Abs: 4.3 10*3/uL (ref 1.7–7.7)
Neutrophils Relative %: 62 %
Platelets: 398 10*3/uL (ref 150–400)
RBC: 2.86 MIL/uL — ABNORMAL LOW (ref 3.87–5.11)
RDW: 14.2 % (ref 11.5–15.5)
WBC: 7 10*3/uL (ref 4.0–10.5)

## 2016-06-13 LAB — TROPONIN I
TROPONIN I: 0.04 ng/mL — AB (ref <0.03)
TROPONIN I: 0.04 ng/mL — AB (ref <0.03)
TROPONIN I: 0.03 ng/mL — AB (ref <0.03)
TROPONIN I: 0.03 ng/mL — AB (ref <0.03)
Troponin I: 0.04 ng/mL (ref <0.03)

## 2016-06-13 LAB — BRAIN NATRIURETIC PEPTIDE: B Natriuretic Peptide: 379 pg/mL — ABNORMAL HIGH (ref 0.0–100.0)

## 2016-06-13 LAB — MRSA PCR SCREENING: MRSA by PCR: NEGATIVE

## 2016-06-13 LAB — TSH: TSH: 2.09 u[IU]/mL (ref 0.350–4.500)

## 2016-06-13 MED ORDER — ACETAMINOPHEN 325 MG PO TABS
650.0000 mg | ORAL_TABLET | Freq: Once | ORAL | Status: AC
  Administered 2016-06-13: 650 mg via ORAL
  Filled 2016-06-13: qty 2

## 2016-06-13 MED ORDER — HYDROCHLOROTHIAZIDE 25 MG PO TABS
25.0000 mg | ORAL_TABLET | Freq: Every day | ORAL | Status: DC
  Administered 2016-06-13 – 2016-06-14 (×2): 25 mg via ORAL
  Filled 2016-06-13 (×2): qty 1

## 2016-06-13 MED ORDER — ABACAVIR-DOLUTEGRAVIR-LAMIVUD 600-50-300 MG PO TABS
1.0000 | ORAL_TABLET | Freq: Every day | ORAL | Status: DC
  Administered 2016-06-13 – 2016-06-15 (×3): 1 via ORAL
  Filled 2016-06-13 (×5): qty 1

## 2016-06-13 MED ORDER — CEPHALEXIN 500 MG PO CAPS
500.0000 mg | ORAL_CAPSULE | Freq: Once | ORAL | Status: AC
  Administered 2016-06-13: 500 mg via ORAL
  Filled 2016-06-13: qty 1

## 2016-06-13 MED ORDER — ONDANSETRON HCL 4 MG/2ML IJ SOLN: 4.0000 mg | Freq: Four times a day (QID) | INTRAMUSCULAR | Status: DC | PRN

## 2016-06-13 MED ORDER — ACETAMINOPHEN 650 MG RE SUPP: 650.0000 mg | Freq: Four times a day (QID) | RECTAL | Status: DC | PRN

## 2016-06-13 MED ORDER — ORAL CARE MOUTH RINSE
15.0000 mL | Freq: Two times a day (BID) | OROMUCOSAL | Status: DC
  Administered 2016-06-13 – 2016-06-14 (×4): 15 mL via OROMUCOSAL

## 2016-06-13 MED ORDER — ENOXAPARIN SODIUM 40 MG/0.4ML ~~LOC~~ SOLN
40.0000 mg | SUBCUTANEOUS | Status: DC
  Administered 2016-06-13 – 2016-06-14 (×2): 40 mg via SUBCUTANEOUS
  Filled 2016-06-13 (×2): qty 0.4

## 2016-06-13 MED ORDER — VALSARTAN-HYDROCHLOROTHIAZIDE 320-25 MG PO TABS: 1.0000 | ORAL_TABLET | Freq: Every day | ORAL | Status: DC

## 2016-06-13 MED ORDER — FUROSEMIDE 10 MG/ML IJ SOLN
20.0000 mg | Freq: Once | INTRAMUSCULAR | Status: AC
  Administered 2016-06-13: 20 mg via INTRAVENOUS
  Filled 2016-06-13: qty 2

## 2016-06-13 MED ORDER — HYDRALAZINE HCL 25 MG PO TABS
100.0000 mg | ORAL_TABLET | Freq: Three times a day (TID) | ORAL | Status: DC
  Administered 2016-06-13 – 2016-06-15 (×6): 100 mg via ORAL
  Filled 2016-06-13 (×2): qty 4
  Filled 2016-06-13: qty 2
  Filled 2016-06-13 (×10): qty 4

## 2016-06-13 MED ORDER — IRBESARTAN 150 MG PO TABS
300.0000 mg | ORAL_TABLET | Freq: Every day | ORAL | Status: DC
  Administered 2016-06-13 – 2016-06-14 (×2): 300 mg via ORAL
  Filled 2016-06-13: qty 2
  Filled 2016-06-13 (×2): qty 1

## 2016-06-13 MED ORDER — ONDANSETRON HCL 4 MG PO TABS: 4.0000 mg | ORAL_TABLET | Freq: Four times a day (QID) | ORAL | Status: DC | PRN

## 2016-06-13 MED ORDER — ACETAMINOPHEN 325 MG PO TABS: 650.0000 mg | ORAL_TABLET | Freq: Four times a day (QID) | ORAL | Status: DC | PRN

## 2016-06-13 MED ORDER — INFLUENZA VAC SPLIT QUAD 0.5 ML IM SUSY: 0.5000 mL | PREFILLED_SYRINGE | INTRAMUSCULAR | Status: DC

## 2016-06-13 MED ORDER — SODIUM CHLORIDE 0.9 % IV SOLN
INTRAVENOUS | Status: DC
  Administered 2016-06-13: 07:00:00 via INTRAVENOUS

## 2016-06-13 NOTE — Progress Notes (Signed)
PROGRESS NOTE  Karen Luna [id number] DOB: Mar 10, 1932 DOA: 06/13/2016 PCP: [name], MD  Brief Narrative: 83 year old woman PMH anemia, HIV, presented with episode of dizziness, possible near syncope although history somewhat difficult with question of dementia. Reportedly has been told in the past that she needed a pacemaker. Concern for Wenckebach with profound bradycardia, possible symptomatic bradycardia. Referred for observation for cardiology evaluation and monitoring.  Assessment/Plan 1. Near syncope with wandering atrial pacemaker, Mobitz 1 bradycardia, frequent nonconducted PACs, junctional escape beats. Not on any AV node blocking agents  Patient not in favor of pacemaker, however comprehension appears to be limited. Per cardiology continue telemetry, follow-up TSH, discussed with son, consider EP evaluation and transfer tomorrow if family in agreement 2. Elevated troponin, asymptomatic. No evidence of ACS. Troponins flat.  Follow troponins 3. Equivocal urinalysis. Not clearly symptomatic, but given dementia will treat.  Follow-up culture data, continue oral antibiotics 4. HIV, stable  Continue outpatient medications 5. Anemia of chronic disease, at baseline 6. Aortic atherosclerosis, asymptomatic 7. Chronic kidney disease stage III, appears to be at baseline 8. Dementia? Recommend outpatient evaluation   Appears stable at this point, continue telemetry, management per cardiology.  DVT prophylaxis: Lovenox Code Status: Full Family Communication: none Disposition Plan: home   Karen Hodgkins, MD  [facility] Direct contact: [phone] --Via amion app OR  --[email or web address]; password [email or web address]  7PM-7AM contact night coverage as above 06/13/2016, 10:39 AM  LOS: 0 days   Consultants:  Cardiology  Procedures:    Antimicrobials:    Interval history/Subjective: Currently feels fine. No shortness of breath or chest pain. No dizziness. No  complaints.  Objective: Vitals:   06/13/16 0624 06/13/16 0700 06/13/16 0800 06/13/16 0900  BP:  (!) 180/77 (!) 150/63 (!) 142/64  Pulse:  79 (!) 51 (!) 55  Resp:  18 19 13   Temp: 97.9 F (36.6 C)  98.1 F (36.7 C)   TempSrc: Oral     SpO2:  100% 100% 100%  Weight: 66.9 kg (147 lb 7.8 oz)     Height: 5\' 7"  (1.702 m)       Intake/Output Summary (Last 24 hours) at 06/13/16 1039 Last data filed at 06/13/16 1000  Gross per 24 hour  Intake              370 ml  Output              650 ml  Net             -280 ml     Filed Weights   06/13/16 0030 06/13/16 0624  Weight: 80.7 kg (178 lb) 66.9 kg (147 lb 7.8 oz)    Exam:    Constitutional:   Appears calm, comfortable Respiratory:   Clear to auscultation bilaterally. No wheezes, rales or rhonchi. Normal respiratory effort. Cardiovascular:   Bradycardic, 2/6 systolic murmur. No rub or gallop. No lower extremity edema.  Telemetry irregular, sinus bradycardia Neurologic:   Grossly nonfocal Psychiatric:   Grossly normal mood and affect.   I have personally reviewed following labs and imaging studies:  Complete metabolic panel unremarkable. BUN only modestly elevated. Creatinine improved over baseline  Troponins equivocal, 0.04. BNP modestly elevated 379  Hemoglobin stable 10.1  Urinalysis equivocal  Scheduled Meds: . abacavir-dolutegravir-lamiVUDine  1 tablet Oral Daily  . enoxaparin (LOVENOX) injection  40 mg Subcutaneous Q24H  . hydrALAZINE  100 mg Oral TID  . irbesartan  300 mg Oral Daily   And  . hydrochlorothiazide  25  mg Oral Daily  . mouth rinse  15 mL Mouth Rinse BID   Continuous Infusions: . sodium chloride 10 mL/hr at 06/13/16 0700    Principal Problem:   Bradycardia Active Problems:   Chronic kidney disease, stage 3   Anemia of chronic disease   Aortic atherosclerosis (HCC)   Near syncope   LOS: 0 days

## 2016-06-13 NOTE — ED Provider Notes (Signed)
[facility] DEPT Provider Note   CSN: [id number] Arrival date & time: 06/13/16  0011  By signing my name below, I, Karen Luna, attest that this documentation has been prepared under the direction and in the presence of Rolland Porter, MD. Electronically Signed: [name], ED Scribe. 06/13/16. 12:45 AM.  Time seen 12:35 AM  History   Chief Complaint Chief Complaint  Patient presents with  . URI   LEVEL V CAVEAT: HPI and ROS limited due to dementia  The history is provided by the patient and a relative. History limited by: dementia. No language interpreter was used.    HPI Comments: Karen Luna is a 83 y.o. female with a h/o anemia, CKD stage III, HIV infection, who presents to the Emergency Department complaining of persistent sensation of near-syncopal dizziness beginning three hours ago. Per pt, she was getting ready for bed when she laid down and her dizziness began. Pt was asymptomatic all day yesterday prior. Her dizziness is not "room-spinning". She states she feels like she is going to pass out. Pt notes associated shortness of breath which began "right now". Her son also reports that she has been coughing and wheezing tonight en route to the ED. He also notes that she has been having increased memory issues recently. She also notes that she  has had some bilateral lower extremity swelling, right worse than the left which is a baseline issue for her. Pt is a non-smoker and does not drink alcohol. She does not use an inhaler or nebulizer, and she does not have a h/o wheezing, per son. Pt denies headache, chest pain, sore throat, rhinorrhea, nausea, vomiting, diarrhea, abdominal pain, or any other associated symptoms.   PCP: [name], MD  Past Medical History:  Diagnosis Date  . Anemia of chronic disease 07/06/2011  . Aortic atherosclerosis ([place]) 01/06/2015   Seen on CT scan, currently asymptomatic  . Cancer ([place])    uterine( per pt)  . Chronic kidney  disease, stage 3 09/01/2008  . Chronic venous insufficiency 10/03/2012  . Essential hypertension 09/19/2006  . Heart murmur    Aortic area  . History of cervical cancer 08/11/2012   s/p hysterectomy, remote, specifics unknown   . Human immunodeficiency virus (HIV) disease ([place]) 09/12/2006  . 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 07/25/2012  . Low grade squamous intraepithelial lesion (LGSIL) on cervical Pap smear 08/06/2007   LGSIL: 08/06/2007, 11/05/2008, 05/11/2009 ASC-US: 05/27/2008, 08/31/2009, 05/24/2010, 05/23/2012 High Risk HPV: Detected   . Onychomycosis of left great toe 09/09/2015  . Overweight (BMI 25.0-29.9) 07/25/2012   Patient Active Problem List   Diagnosis Date Noted  . Bradycardia 06/13/2016  . Dementia without behavioral disturbance 04/18/2016  . Passed out   . Confusion 04/08/2016  . Acute encephalopathy 03/24/2016  . Urinary tract infection without hematuria 03/24/2016  . Hyperammonemia ([place]) 03/24/2016  . Mobitz I 03/24/2016  . Onychomycosis of left great toe 09/09/2015  . Aortic atherosclerosis ([place]) 01/06/2015  . Acquired heart block 12/29/2014  . Encounter for long-term (current) use of medications 09/16/2014  . Screening examination for venereal disease 09/16/2014  . Chronic venous insufficiency 10/03/2012  . History of cervical cancer 08/11/2012  . Overweight (BMI 25.0-29.9) 07/25/2012  . Healthcare maintenance 07/25/2012  . 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 07/25/2012  . Anemia of chronic disease 07/06/2011  . Chronic kidney disease, stage 3 09/01/2008  . Low grade squamous intraepithelial lesion (LGSIL) on cervical Pap smear 08/06/2007  . Essential  hypertension 09/19/2006  . Human immunodeficiency virus (HIV) disease ([place]) 09/12/2006   Past Surgical History:  Procedure Laterality Date  . ABDOMINAL HYSTERECTOMY     OB History    No data available     Home Medications    Prior to Admission medications   Medication Sig  Start Date End Date Taking? Authorizing Provider  abacavir-dolutegravir-lamiVUDine (TRIUMEQ) 600-50-300 MG tablet Take 1 tablet by mouth daily. 05/01/16   [name], MD  amLODipine (NORVASC) 10 MG tablet Take 1 tablet (10 mg total) by mouth daily. 06/06/16   [name], MD  hydrALAZINE (APRESOLINE) 100 MG tablet Take 1 tablet (100 mg total) by mouth 3 (three) times daily. 09/09/15   [name], MD  valsartan-hydrochlorothiazide (DIOVAN-HCT) 320-25 MG tablet Take 1 tablet by mouth daily. 05/27/15   [name], MD   Family History Family History  Problem Relation Age of Onset  . Hypertension Mother   . Heart disease Mother   . Hypertension Father   . Heart disease Father   . Hypertension Sister   . Healthy Brother   . HIV/AIDS Daughter   . Healthy Son   . Healthy Sister   . Healthy Sister   . Healthy Sister   . Healthy Sister   . Early death Sister     Gun shot wound  . Healthy Brother   . Healthy Son   . Cerebrovascular Accident Son    Social History Social History  Substance Use Topics  . Smoking status: Former Smoker    Types: Cigarettes    Quit date: 08/06/1988  . Smokeless tobacco: Never Used  . Alcohol use No  lives at home Lives alone   Allergies   Clonidine derivatives  Review of Systems Review of Systems  Unable to perform ROS: Dementia   Physical Exam Updated Vital Signs BP 158/68 (BP Location: Right Arm)   Pulse 75   Temp 98.1 F (36.7 C) (Oral)   Resp 20   Ht 5\' 7"  (1.702 m)   Wt 178 lb (80.7 kg)   SpO2 99%   BMI 27.88 kg/m   Vital signs normal    Physical Exam  Constitutional: She appears well-developed and well-nourished.  Non-toxic appearance. She does not appear ill. No distress.  HENT:  Head: Normocephalic and atraumatic.  Right Ear: External ear normal.  Left Ear: External ear normal.  Nose: Nose normal. No mucosal edema or rhinorrhea.  Mouth/Throat: Oropharynx is clear and moist and mucous membranes are normal. No dental  abscesses or uvula swelling.  Eyes: Conjunctivae and EOM are normal. Pupils are equal, round, and reactive to light.  Neck: Normal range of motion and full passive range of motion without pain. Neck supple.  Cardiovascular: Normal rate, regular rhythm and normal heart sounds.  Exam reveals no gallop and no friction rub.   No murmur heard. Pulmonary/Chest: Effort normal and breath sounds normal. No respiratory distress. She has no wheezes. She has no rhonchi. She has no rales. She exhibits no tenderness and no crepitus.  Abdominal: Soft. Normal appearance and bowel sounds are normal. She exhibits no distension. There is no tenderness. There is no rebound and no guarding.  Musculoskeletal: Normal range of motion. She exhibits edema. She exhibits no tenderness.  Moves all extremities well. Edema of the RLE, less so on the left.  Neurological: She has normal strength. No cranial nerve deficit.  Skin: Skin is warm, dry and intact. No rash noted. No erythema. No pallor.  Psychiatric: Her speech is  normal. Her mood appears not anxious.  Pt seems upset; however, she will not tell me what is wrong. Sons volunteers he thinks she has dementia, and he gets her days confused and he has to check on her frequently.   Nursing note and vitals reviewed.  ED Treatments / Results  DIAGNOSTIC STUDIES: Oxygen Saturation is 99% on RA, normal by my interpretation.   Labs (all labs ordered are listed, but only abnormal results are displayed) Results for orders placed or performed during the hospital encounter of 06/13/16  CBC with Differential  Result Value Ref Range   WBC 7.0 4.0 - 10.5 K/uL   RBC 2.86 (L) 3.87 - 5.11 MIL/uL   Hemoglobin 10.1 (L) 12.0 - 15.0 g/dL   HCT 30.4 (L) 36.0 - 46.0 %   MCV 106.3 (H) 78.0 - 100.0 fL   MCH 35.3 (H) 26.0 - 34.0 pg   MCHC 33.2 30.0 - 36.0 g/dL   RDW 14.2 11.5 - 15.5 %   Platelets 398 150 - 400 K/uL   Neutrophils Relative % 62 %   Neutro Abs 4.3 1.7 - 7.7 K/uL    Lymphocytes Relative 25 %   Lymphs Abs 1.7 0.7 - 4.0 K/uL   Monocytes Relative 12 %   Monocytes Absolute 0.8 0.1 - 1.0 K/uL   Eosinophils Relative 1 %   Eosinophils Absolute 0.1 0.0 - 0.7 K/uL   Basophils Relative 0 %   Basophils Absolute 0.0 0.0 - 0.1 K/uL  Comprehensive metabolic panel  Result Value Ref Range   Sodium 138 135 - 145 mmol/L   Potassium 4.2 3.5 - 5.1 mmol/L   Chloride 105 101 - 111 mmol/L   CO2 21 (L) 22 - 32 mmol/L   Glucose, Bld 112 (H) 65 - 99 mg/dL   BUN 22 (H) 6 - 20 mg/dL   Creatinine, Ser 1.12 (H) 0.44 - 1.00 mg/dL   Calcium 10.3 8.9 - 10.3 mg/dL   Total Protein 8.0 6.5 - 8.1 g/dL   Albumin 4.2 3.5 - 5.0 g/dL   AST 20 15 - 41 U/L   ALT 19 14 - 54 U/L   Alkaline Phosphatase 60 38 - 126 U/L   Total Bilirubin 0.4 0.3 - 1.2 mg/dL   GFR calc non Af Amer 44 (L) >60 mL/min   GFR calc Af Amer 52 (L) >60 mL/min   Anion gap 12 5 - 15  Troponin I  Result Value Ref Range   Troponin I 0.04 (HH) <0.03 ng/mL  Brain natriuretic peptide  Result Value Ref Range   B Natriuretic Peptide 379.0 (H) 0.0 - 100.0 pg/mL  Urinalysis, Routine w reflex microscopic  Result Value Ref Range   Color, Urine STRAW (A) YELLOW   APPearance CLEAR CLEAR   Specific Gravity, Urine 1.004 (L) 1.005 - 1.030   pH 8.0 5.0 - 8.0   Glucose, UA NEGATIVE NEGATIVE mg/dL   Hgb urine dipstick NEGATIVE NEGATIVE   Bilirubin Urine NEGATIVE NEGATIVE   Ketones, ur NEGATIVE NEGATIVE mg/dL   Protein, ur NEGATIVE NEGATIVE mg/dL   Nitrite NEGATIVE NEGATIVE   Leukocytes, UA MODERATE (A) NEGATIVE   RBC / HPF 0-5 0 - 5 RBC/hpf   WBC, UA 6-30 0 - 5 WBC/hpf   Bacteria, UA FEW (A) NONE SEEN   Laboratory interpretation all normal except possible UTI + troponin    EKG  EKG Interpretation  Date/Time:  Wednesday June 13 2016 02:31:36 EST Ventricular Rate:  58 PR Interval:    QRS Duration:  94 QT Interval:  443 QTC Calculation: 436 R Axis:   72 Text Interpretation:  Wandering atrial pacemaker  Consider left ventricular hypertrophy Baseline wander Since last tracing 11 Apr 2016 was in sinus bradycardia Confirmed by [facility]  MD-I, Butch Otterson ([id number]) on 06/13/2016 2:42:07 AM             Radiology Dg Chest 2 View  Result Date: 06/13/2016 CLINICAL DATA:  Cough. Near syncope and dizziness beginning 3 hours ago. Shortness of breath. History of chronic kidney disease and HIV infection. EXAM: CHEST  2 VIEW COMPARISON:  03/24/2016 FINDINGS: Mild cardiac enlargement without vascular congestion. No edema or consolidation in the lungs. No blunting of costophrenic angles. No pneumothorax. IMPRESSION: No active cardiopulmonary disease. Electronically Signed   By: Lucienne Capers M.D.   On: 06/13/2016 01:16    Procedures Procedures   Medications Ordered in ED Medications  acetaminophen (TYLENOL) tablet 650 mg (650 mg Oral Given 06/13/16 0325)  cephALEXin (KEFLEX) capsule 500 mg (500 mg Oral Given 06/13/16 0354)    Initial Impression / Assessment and Plan / ED Course  I have reviewed the triage vital signs and the nursing notes.  Pertinent labs & imaging results that were available during my care of the patient were reviewed by me and considered in my medical decision making (see chart for details).     COORDINATION OF CARE: 12:42 AM-Discussed next steps with pt and son. This includes laboratory studies and CXR. Son verbalized understanding and is agreeable with the plan.   After reviewing her urinalysis she was started on oral antibiotics. Urine culture was ordered. Son states she has been getting frequent UTIs recently. However when I look at her last culture which was in December it was multiple species.  03:50 AM I went into the room to talk to the patient and her sign about her test results. Second troponin will be pending soon. When I look at the monitor patient is having a irregular heartbeat that almost looks like a Wenkebach pattern and her heart rate would get down to 38. This did not  seem to correspond with her feeling dizzy. Patient actually is hypertensive. I talked to the son that she will need to be admitted for further cardiac monitoring and may need a pacemaker if she is having significant bradycardia.  4:09 AM patient discussed with cardiologist on call, Dr. Waylan Rocher, he recommends stopping her Norvasc and see if that does not improve her bradycardia. He states she does not need a pacemaker at this time. He feels she can be admitted here and monitored.  Patient had external pacemaker placed. It was not turned on.  04:25 AM Dr Darrick Meigs, admit to tele, observation  Patient has in her problem list heart block, however I did not find any cardiology notes when I briefly looked through her prior notes.  Final Clinical Impressions(s) / ED Diagnoses   Final diagnoses:  Bradycardia  Lightheaded  Urinary tract infection without hematuria, site unspecified    Plan admission  Rolland Porter, MD, FACEP   I personally performed the services described in this documentation, which was scribed in my presence. The recorded information has been reviewed and considered.  Rolland Porter, MD, Barbette Or, MD 06/13/16 [phone]

## 2016-06-13 NOTE — Consult Note (Signed)
Cardiology Consultation   Patient ID: Karen Luna; [id number]; 09-Dec-1931   Admit date: 06/13/2016 Date of Consult: 06/13/2016  Referring MD:  Dr. Sarajane Jews Cardiologist: Dr. Bea Graff Consulting Cardiologist: Dr. Harl Bowie  Patient Care Team: [name], MD as PCP - General (Internal Medicine) Thayer Headings, MD as PCP - Infectious Diseases (Infectious Diseases)    Reason for Consultation: near syncope, bradycardia   History of Present Illness: Karen Luna is a 83 y.o. female with a hx of hypertension, HIV, CKD stage III, dementia admitted with dizziness and found to be bradycardic and in Wenckebach. She had an admission 03/2016 with syncope and was not orthostatic. Her EKG was stable at that time and troponins were negative. 2-D echo showed normal LVEF 60-65% with grade 2 DD and severely dilated left atrium. Pulmonary artery pressures were mildly increased.  Patient has memory problems and can give very little history. Can't remember syncope in Dec. Notes say she was hospitalized in [place] a couple months ago with syncope and told she needed a pacemaker. Son not here. She says she has "hot flashes" often and becomes dizzy. She smells rubbing alcohol to help it pass. She says she's been doing that for a long time. Denies chest pain, shortness of breath, palpitations. Has chronic right foot swelling.  Reviewing EKGs back to 2016 she has had sinus bradycardia in the 40s. She did have an EKG 03/24/16 that showed Wenckebach II. This admission troponins flat at 0.03, 0.04, creatinine 1.12, hemoglobin 10.1. Tele shows HR down to 32 with Type II Wenckebach. She says she doesn't want a pacemaker.  Past Medical History:  Diagnosis Date  . Anemia of chronic disease 07/06/2011  . Aortic atherosclerosis ([place]) 01/06/2015   Seen on CT scan, currently asymptomatic  . Cancer ([place])    uterine( per pt)  . Chronic kidney disease, stage 3 09/01/2008  . Chronic venous insufficiency 10/03/2012  .  Essential hypertension 09/19/2006  . Heart murmur    Aortic area  . History of cervical cancer 08/11/2012   s/p hysterectomy, remote, specifics unknown   . Human immunodeficiency virus (HIV) disease ([place]) 09/12/2006  . 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 07/25/2012  . Low grade squamous intraepithelial lesion (LGSIL) on cervical Pap smear 08/06/2007   LGSIL: 08/06/2007, 11/05/2008, 05/11/2009 ASC-US: 05/27/2008, 08/31/2009, 05/24/2010, 05/23/2012 High Risk HPV: Detected   . Onychomycosis of left great toe 09/09/2015  . Overweight (BMI 25.0-29.9) 07/25/2012    Past Surgical History:  Procedure Laterality Date  . ABDOMINAL HYSTERECTOMY        Home Meds: Prior to Admission medications   Medication Sig Start Date End Date Taking? Authorizing Provider  abacavir-dolutegravir-lamiVUDine (TRIUMEQ) 600-50-300 MG tablet Take 1 tablet by mouth daily. 05/01/16   [name], MD  amLODipine (NORVASC) 10 MG tablet Take 1 tablet (10 mg total) by mouth daily. 06/06/16   [name], MD  hydrALAZINE (APRESOLINE) 100 MG tablet Take 1 tablet (100 mg total) by mouth 3 (three) times daily. 09/09/15   [name], MD  valsartan-hydrochlorothiazide (DIOVAN-HCT) 320-25 MG tablet Take 1 tablet by mouth daily. 05/27/15   [name], MD    Current Medications: . abacavir-dolutegravir-lamiVUDine  1 tablet Oral Daily  . enoxaparin (LOVENOX) injection  40 mg Subcutaneous Q24H  . hydrALAZINE  100 mg Oral TID  . irbesartan  300 mg Oral Daily   And  . hydrochlorothiazide  25 mg Oral Daily  . mouth rinse  15 mL Mouth Rinse BID  Allergies:    Allergies  Allergen Reactions  . Clonidine Derivatives Other (See Comments)    Non-specific heart block    Social History:   The patient  reports that she quit smoking about 27 years ago. Her smoking use included Cigarettes. She has never used smokeless tobacco. She reports that she does not drink alcohol or use drugs.    Family History:   The patient's  family history includes Cerebrovascular Accident in her son; Early death in her sister; HIV/AIDS in her daughter; Healthy in her brother, brother, sister, sister, sister, sister, son, and son; Heart disease in her father and mother; Hypertension in her father, mother, and sister.   ROS:  Please see the history of present illness.  Review of Systems  Reason unable to perform ROS: dementia       Vital Signs: Blood pressure (!) 180/77, pulse 79, temperature 97.9 F (36.6 C), temperature source Oral, resp. rate 18, height 5\' 7"  (1.702 m), weight 147 lb 7.8 oz (66.9 kg), SpO2 100 %.   PHYSICAL EXAM: General:  Well nourished, well developed, in no acute distress  HEENT: normal Lymph: no adenopathy Neck: no JVD Endocrine:  No thryomegaly Vascular: right carotid bruit; FA pulses 2+ bilaterally without bruits  Cardiac:  RRR; normal S1, S2;  1/6 systolic murmur apex,no rub, bruit, thrill, or heave Lungs:  Decreased breath sounds with crackles at bases Abd: soft, nontender, no hepatomegaly  Ext: right ankle edema, Good distal pulses bilaterally Musculoskeletal:  No deformities, BUE and BLE strength normal and equal Skin: warm and dry  Neuro:  Poor memory, no focal abnormalities noted Psych:  Normal affect    EKG:  Sinus bradycardia 58 with Wenckebach  Telemetry: sinus brady down to 32 with Type II Wenckebach  Labs:  Recent Labs  06/13/16 0215 06/13/16 0459  TROPONINI 0.04* 0.04*   Lab Results  Component Value Date   WBC 7.0 06/13/2016   HGB 10.1 (L) 06/13/2016   HCT 30.4 (L) 06/13/2016   MCV 106.3 (H) 06/13/2016   PLT 398 06/13/2016    Recent Labs Lab 06/13/16 0215  NA 138  K 4.2  CL 105  CO2 21*  BUN 22*  CREATININE 1.12*  CALCIUM 10.3  PROT 8.0  BILITOT 0.4  ALKPHOS 60  ALT 19  AST 20  GLUCOSE 112*   Lab Results  Component Value Date   CHOL 183 01/10/2016   HDL 60 01/10/2016   LDLCALC 94 01/10/2016   TRIG 144 01/10/2016   No results found for:  DDIMER  Radiology/Studies:  Dg Chest 2 View  Result Date: 06/13/2016 CLINICAL DATA:  Cough. Near syncope and dizziness beginning 3 hours ago. Shortness of breath. History of chronic kidney disease and HIV infection. EXAM: CHEST  2 VIEW COMPARISON:  03/24/2016 FINDINGS: Mild cardiac enlargement without vascular congestion. No edema or consolidation in the lungs. No blunting of costophrenic angles. No pneumothorax. IMPRESSION: No active cardiopulmonary disease. Electronically Signed   By: Lucienne Capers M.D.   On: 06/13/2016 01:16  2Decho 03/2016 Study Conclusions   - Left ventricle: The cavity size was normal. Wall thickness was   normal. Systolic function was vigorous. The estimated ejection   fraction was in the range of 65% to 70%. Wall motion was normal;   there were no regional wall motion abnormalities. Features are   consistent with a pseudonormal left ventricular filling pattern,   with concomitant abnormal relaxation and increased filling   pressure (grade 2 diastolic dysfunction). Doppler parameters  are   consistent with high ventricular filling pressure. - Aortic valve: Mildly calcified annulus. Trileaflet; mildly   thickened leaflets. Valve area (VTI): 1.59 cm^2. Valve area   (Vmax): 1.79 cm^2. - Left atrium: The atrium was severely dilated. - Right atrium: The atrium was moderately dilated. - Atrial septum: No defect or patent foramen ovale was identified. - Pulmonary arteries: Systolic pressure was mildly increased. PA   peak pressure: 39 mm Hg (S). - Technically adequate study.   PROBLEM LIST:  Active Problems:   Bradycardia   Near syncope     ASSESSMENT AND PLAN:  Near syncope with bradycardia down to 32 and Type II Wenckebach. History of syncope. May need pacemaker but patient is reluctant. Not sure she is capable of making an informed decision with her dementia. Will need to speak with her family.  HTN on Diovan/HCTZ, apresoline, norvasc-not on rate lowering  meds.  Diastolic CHF with grade 2 DD on echo. BNP 379, ankle edema and crackles. Would give low dose Lasix 20 mg IV.  CKD stage III  HIV  Dementia  Anemia Hgb was 9.3 7.6, 9.3 in December, now 10    Weston Brass Hulda Humphrey  06/13/2016 7:49 AM   Attending Note Patient seen and discussed with PA Bonnell Public, I agreee with her documentation. 83 yo female with history of HIV, HTN, CKD III, dementia, chronic diastolic HF. Previous admission 04/10/16 with syncope with negative workup. Notes indicate being told previously during an evaluation in [place] that she would need a pacemaker. She remembers this but does not remember when it was or who the physician was.  TSH is pending. She is not on any av nodal agents at home.   Admitted with near syncope. Reports several week history of symptoms. Limits her ability to get around the house.   WBC 7, Hgb 10.1, Plt 398, K 4.2, Cr 1.12, BNP 379, TSH pending. UA moderate LE, few bacteria.  Trop 0.04 -->0.04--> CXR no acute process 03/2016 echo: LVEF 65-70%, no WMAs, grade II diastolic dysfunction EKG SR with wenchebach block  Patient admitted with near syncope. Telemetry shows sinus bradycardia, wandering atrial pacemaker, second degree AV block type I (Wenchebach),frequent nonconducted PACs that drive her heart rates down to the mid 30s, and at times junctional escape beats and rhythm in the 30-40s. She is not in favor of pacemaker, however she seems to have limited comprehension (A&O x 1), though she reports she lives independently. Admitted early this AM, would continue telemetry today and follow up TSH, complete workup of possible UTI. Pending further monitor results and discussion with patient and her son could consider EP evaluation with transfer to [facility] probably tomorrow if family is in agreement. Can continue to hold norvasc.    Zandra Abts MD

## 2016-06-13 NOTE — ED Notes (Signed)
CRITICAL VALUE ALERT  Critical value received:  Troponin 0.04  Date of notification:  06/13/2016  Time of notification:  0225  Critical value read back:Yes.    Nurse who received alert:  Charlies Silvers RN   MD notified (1st page):  Tomi Bamberger  Time of first page:  0226  MD notified (2nd page):  Time of second page:  Responding MD:  Tomi Bamberger  Time MD responded:  [phone]

## 2016-06-13 NOTE — H&P (Signed)
TRH H&P    Patient Demographics:    Karen Luna, is a 83 y.o. female  MRN: [id number]  DOB - 08-Nov-1931  Admit Date - 06/13/2016  Referring MD/NP/PA: Dr. Tomi Bamberger  Outpatient Primary MD for the patient is Eppie Gibson, Damita Lack, MD  Patient coming from: Home  Chief Complaint  Patient presents with  . URI      HPI:    Karen Luna  is a 83 y.o. female, With history of anemia, COPD stage III, HIV who came to hospital with episode of near syncope. He shouldn't says that she was about to go to bed and when she did come she started getting dizziness. She denies passing out.   there was associated shortness of breath.she denies chest pain no nausea vomiting or diarrhea.   as per patient's son that she has been having these episodes 1-2 times a month , she was seen for similar episodes in [place] at that time patient was told that she will need pacemaker.  In the ED monitor showed  Wenkebach pattern and her heart rate would get down to 38.cardiology fellow was consulted and treatment admission for observation    Review of systems:       A full 10 point Review of Systems was done, except as stated above, all other Review of Systems were negative.   With Past History of the following :    Past Medical History:  Diagnosis Date  . Anemia of chronic disease 07/06/2011  . Aortic atherosclerosis ([place]) 01/06/2015   Seen on CT scan, currently asymptomatic  . Cancer ([place])    uterine( per pt)  . Chronic kidney disease, stage 3 09/01/2008  . Chronic venous insufficiency 10/03/2012  . Essential hypertension 09/19/2006  . Heart murmur    Aortic area  . History of cervical cancer 08/11/2012   s/p hysterectomy, remote, specifics unknown   . Human immunodeficiency virus (HIV) disease ([place]) 09/12/2006  . 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 07/25/2012  . Low grade squamous intraepithelial lesion (LGSIL) on  cervical Pap smear 08/06/2007   LGSIL: 08/06/2007, 11/05/2008, 05/11/2009 ASC-US: 05/27/2008, 08/31/2009, 05/24/2010, 05/23/2012 High Risk HPV: Detected   . Onychomycosis of left great toe 09/09/2015  . Overweight (BMI 25.0-29.9) 07/25/2012      Past Surgical History:  Procedure Laterality Date  . ABDOMINAL HYSTERECTOMY        Social History:      Social History  Substance Use Topics  . Smoking status: Former Smoker    Types: Cigarettes    Quit date: 08/06/1988  . Smokeless tobacco: Never Used  . Alcohol use No       Family History :     Family History  Problem Relation Age of Onset  . Hypertension Mother   . Heart disease Mother   . Hypertension Father   . Heart disease Father   . Hypertension Sister   . Healthy Brother   . HIV/AIDS Daughter   . Healthy Son   . Healthy Sister   . Healthy Sister   .  Healthy Sister   . Healthy Sister   . Early death Sister     Gun shot wound  . Healthy Brother   . Healthy Son   . Cerebrovascular Accident Son       Home Medications:   Prior to Admission medications   Medication Sig Start Date End Date Taking? Authorizing Provider  abacavir-dolutegravir-lamiVUDine (TRIUMEQ) 600-50-300 MG tablet Take 1 tablet by mouth daily. 05/01/16   [name], MD  amLODipine (NORVASC) 10 MG tablet Take 1 tablet (10 mg total) by mouth daily. 06/06/16   [name], MD  hydrALAZINE (APRESOLINE) 100 MG tablet Take 1 tablet (100 mg total) by mouth 3 (three) times daily. 09/09/15   [name], MD  valsartan-hydrochlorothiazide (DIOVAN-HCT) 320-25 MG tablet Take 1 tablet by mouth daily. 05/27/15   [name], MD     Allergies:     Allergies  Allergen Reactions  . Clonidine Derivatives Other (See Comments)    Non-specific heart block     Physical Exam:   Vitals  Blood pressure 152/70, pulse (!) 57, temperature 98.1 F (36.7 C), temperature source Oral, resp. rate 17, height 5\' 7"  (1.702 m), weight 80.7 kg (178 lb), SpO2 100 %.  1.   General: Appears in no acute distress  2. Psychiatric:  Intact judgement and  insight, awake alert, oriented x 3.  3. Neurologic: No focal neurological deficits, all cranial nerves intact.Strength 5/5 all 4 extremities, sensation intact all 4 extremities, plantars down going.  4. Eyes :  anicteric sclerae, moist conjunctivae with no lid lag. PERRLA.  5. ENMT:  Oropharynx clear with moist mucous membranes and good dentition  6. Neck:  supple, no cervical lymphadenopathy appriciated, No thyromegaly  7. Respiratory : Normal respiratory effort, good air movement bilaterally,clear to  auscultation bilaterally  8. Cardiovascular : RRR, no gallops, rubs or murmurs, bilateral 1+ pitting edema  9. Gastrointestinal:  Positive bowel sounds, abdomen soft, non-tender to palpation,no hepatosplenomegaly, no rigidity or guarding       10. Skin:  No cyanosis, normal texture and turgor, no rash, lesions or ulcers  11.Musculoskeletal:  Good muscle tone,  joints appear normal , no effusions,  normal range of motion    Data Review:    CBC  Recent Labs Lab 06/13/16 0215  WBC 7.0  HGB 10.1*  HCT 30.4*  PLT 398  MCV 106.3*  MCH 35.3*  MCHC 33.2  RDW 14.2  LYMPHSABS 1.7  MONOABS 0.8  EOSABS 0.1  BASOSABS 0.0   ------------------------------------------------------------------------------------------------------------------  Chemistries   Recent Labs Lab 06/13/16 0215  NA 138  K 4.2  CL 105  CO2 21*  GLUCOSE 112*  BUN 22*  CREATININE 1.12*  CALCIUM 10.3  AST 20  ALT 19  ALKPHOS 60  BILITOT 0.4   ------------------------------------------------------------------------------------------------------------------  ------------------------------------------------------------------------------------------------------------------ GFR: Estimated Creatinine Clearance: 42.3 mL/min (by C-G formula based on SCr of 1.12 mg/dL (H)). Liver Function Tests:  Recent Labs Lab  06/13/16 0215  AST 20  ALT 19  ALKPHOS 60  BILITOT 0.4  PROT 8.0  ALBUMIN 4.2   No results for input(s): LIPASE, AMYLASE in the last 168 hours. No results for input(s): AMMONIA in the last 168 hours. Coagulation Profile: No results for input(s): INR, PROTIME in the last 168 hours. Cardiac Enzymes:  Recent Labs Lab 06/13/16 0215  TROPONINI 0.04*    --------------------------------------------------------------------------------------------------------------- Urine analysis:    Component Value Date/Time   COLORURINE STRAW (A) 06/13/2016 0215   APPEARANCEUR CLEAR 06/13/2016 0215   LABSPEC 1.004 (  L) 06/13/2016 0215   PHURINE 8.0 06/13/2016 0215   GLUCOSEU NEGATIVE 06/13/2016 0215   GLUCOSEU NEG mg/dL 09/12/2006 0000   HGBUR NEGATIVE 06/13/2016 0215   BILIRUBINUR NEGATIVE 06/13/2016 0215   KETONESUR NEGATIVE 06/13/2016 0215   PROTEINUR NEGATIVE 06/13/2016 0215   UROBILINOGEN 0.2 07/23/2013 1431   NITRITE NEGATIVE 06/13/2016 0215   LEUKOCYTESUR MODERATE (A) 06/13/2016 0215      Imaging Results:    Dg Chest 2 View  Result Date: 06/13/2016 CLINICAL DATA:  Cough. Near syncope and dizziness beginning 3 hours ago. Shortness of breath. History of chronic kidney disease and HIV infection. EXAM: CHEST  2 VIEW COMPARISON:  03/24/2016 FINDINGS: Mild cardiac enlargement without vascular congestion. No edema or consolidation in the lungs. No blunting of costophrenic angles. No pneumothorax. IMPRESSION: No active cardiopulmonary disease. Electronically Signed   By: Lucienne Capers M.D.   On: 06/13/2016 01:16    My personal review of EKG: Rhythm Second-degree AV block type I    Assessment & Plan:    Active Problems:   Bradycardia   Near syncope   Near syncope- secondary to heart block, secondary type I Wenckebach's pattern. Will monitor on stepdown unit, consult cartilage in a.m. for possible pacemaker placement.  Hypertension- continue hydralazine, Diovan HCT. But hold  Norvasc at this time as per cardiology recommendation  History of HIV- continueTRiumeq  DVT Prophylaxis-   Lovenox   AM Labs Ordered, also please review Full Orders  Family Communication: Admission, patients condition and plan of care including tests being ordered have been discussed with the patient and her son at bedside who indicate understanding and agree with the plan and Code Status.  Code Status: Full code  Admission status: Observation    Time spent in minutes : 60 minutes   Deran Barro S M.D on 06/13/2016 at 5:33 AM  Between 7am to 7pm - Pager - [phone]. After 7pm go to [email or web address] - password [facility]  [facility] - Office  [phone]

## 2016-06-13 NOTE — ED Triage Notes (Signed)
Pt c/o headache with nasal congestion and dizziness that started today

## 2016-06-14 ENCOUNTER — Encounter (HOSPITAL_COMMUNITY): Payer: Self-pay | Admitting: Nurse Practitioner

## 2016-06-14 DIAGNOSIS — D638 Anemia in other chronic diseases classified elsewhere: Secondary | ICD-10-CM | POA: Diagnosis not present

## 2016-06-14 DIAGNOSIS — N183 Chronic kidney disease, stage 3 (moderate): Secondary | ICD-10-CM | POA: Diagnosis not present

## 2016-06-14 DIAGNOSIS — R55 Syncope and collapse: Secondary | ICD-10-CM

## 2016-06-14 DIAGNOSIS — R001 Bradycardia, unspecified: Secondary | ICD-10-CM

## 2016-06-14 DIAGNOSIS — R42 Dizziness and giddiness: Secondary | ICD-10-CM | POA: Diagnosis not present

## 2016-06-14 DIAGNOSIS — I130 Hypertensive heart and chronic kidney disease with heart failure and stage 1 through stage 4 chronic kidney disease, or unspecified chronic kidney disease: Secondary | ICD-10-CM | POA: Diagnosis not present

## 2016-06-14 DIAGNOSIS — I441 Atrioventricular block, second degree: Secondary | ICD-10-CM | POA: Diagnosis not present

## 2016-06-14 DIAGNOSIS — I700 Atherosclerosis of aorta: Secondary | ICD-10-CM

## 2016-06-14 DIAGNOSIS — I5030 Unspecified diastolic (congestive) heart failure: Secondary | ICD-10-CM | POA: Diagnosis not present

## 2016-06-14 LAB — COMPREHENSIVE METABOLIC PANEL
ALK PHOS: 53 U/L (ref 38–126)
ALT: 14 U/L (ref 14–54)
AST: 16 U/L (ref 15–41)
Albumin: 3.4 g/dL — ABNORMAL LOW (ref 3.5–5.0)
Anion gap: 9 (ref 5–15)
BILIRUBIN TOTAL: 0.4 mg/dL (ref 0.3–1.2)
BUN: 26 mg/dL — AB (ref 6–20)
CALCIUM: 9.4 mg/dL (ref 8.9–10.3)
CO2: 25 mmol/L (ref 22–32)
CREATININE: 1.46 mg/dL — AB (ref 0.44–1.00)
Chloride: 105 mmol/L (ref 101–111)
GFR calc Af Amer: 37 mL/min — ABNORMAL LOW (ref >60)
GFR, EST NON AFRICAN AMERICAN: 32 mL/min — AB (ref >60)
Glucose, Bld: 89 mg/dL (ref 65–99)
Potassium: 3.7 mmol/L (ref 3.5–5.1)
Sodium: 139 mmol/L (ref 135–145)
TOTAL PROTEIN: 6.7 g/dL (ref 6.5–8.1)

## 2016-06-14 LAB — CBC
HEMATOCRIT: 28.1 % — AB (ref 36.0–46.0)
Hemoglobin: 9.5 g/dL — ABNORMAL LOW (ref 12.0–15.0)
MCH: 35.7 pg — AB (ref 26.0–34.0)
MCHC: 33.8 g/dL (ref 30.0–36.0)
MCV: 105.6 fL — AB (ref 78.0–100.0)
Platelets: 370 10*3/uL (ref 150–400)
RBC: 2.66 MIL/uL — AB (ref 3.87–5.11)
RDW: 14 % (ref 11.5–15.5)
WBC: 5.6 10*3/uL (ref 4.0–10.5)

## 2016-06-14 LAB — URINE CULTURE

## 2016-06-14 MED ORDER — SODIUM CHLORIDE 0.9 % IV SOLN
INTRAVENOUS | Status: AC
  Administered 2016-06-14: 19:00:00 via INTRAVENOUS

## 2016-06-14 MED ORDER — CEPHALEXIN 500 MG PO CAPS
500.0000 mg | ORAL_CAPSULE | Freq: Two times a day (BID) | ORAL | Status: DC
  Administered 2016-06-14: 500 mg via ORAL
  Filled 2016-06-14 (×2): qty 1

## 2016-06-14 MED ORDER — ENOXAPARIN SODIUM 30 MG/0.3ML ~~LOC~~ SOLN
30.0000 mg | SUBCUTANEOUS | Status: DC
  Administered 2016-06-15: 30 mg via SUBCUTANEOUS
  Filled 2016-06-14: qty 0.3

## 2016-06-14 MED ORDER — FERROUS SULFATE 325 (65 FE) MG PO TABS
325.0000 mg | ORAL_TABLET | Freq: Every day | ORAL | Status: DC
  Administered 2016-06-15: 325 mg via ORAL
  Filled 2016-06-14: qty 1

## 2016-06-14 MED ORDER — CEFADROXIL 500 MG PO CAPS
500.0000 mg | ORAL_CAPSULE | Freq: Every day | ORAL | Status: DC
  Filled 2016-06-14: qty 1

## 2016-06-14 NOTE — Consult Note (Signed)
ELECTROPHYSIOLOGY CONSULT NOTE    Patient ID: Karen Luna MRN: [id number], DOB/AGE: [birth year] 83 y.o.  Admit date: 06/13/2016 Date of Consult: 06/14/2016  Primary Physician: [name], MD Primary Cardiologist: Harl Bowie Referring MD: Harl Bowie  Reason for Consultation: dizziness and bradycardia   HPI:  Karen Luna is a 83 y.o. female with a past medical history significant for HIV, CKD stage III, dementia, recent unexplained weight loss (20 pounds since 12/2015 unintentionally), longstanding asymptomatic bradycardia, and recent worsening mental status with associated UTI's. She has had 3 admissions to [facility] recently, the first two for worsening confusion. She was diagnosed with UTI during the first admission and completed therapy. She then was readmitted with confusion and recurrent UTI with acute metabolic encephalopathy.  She had a syncopal episode the day of discharge after telemetry had been removed while she was getting dressed. She does not remember the events at all. Notes report negative orthostatics at that time.  She was then readmitted yesterday because she felt like her "BP was high".  She also reports ongoing dizzy spells for years.  She thinks these have been more frequent recently.  She is not able to quantify these spells further.  At [facility], telemetry demonstrated sinus rhythm, intermittent junctional escape beats, sinus arrhythmia, Mobitz I. She was transferred to [facility] for EP evaluation.    She is seen today with her son and daughter in law who assist with history. She is oriented to person only. She did not know that she was at [facility], who the president is, or what year it is.  She lives independently with close family support.  Her family states that her confusion has been worsening recently.  She has not had any dizzy spells since admitted despite some bradycardia on telemetry.  She has longstanding bradycardia that has always been asymptomatic (SR in the  40's in 2016 without symptoms).  She states that her dizzy spells mostly occur while sitting, although I am not sure of how accurate her history is.  Her son states that when she felt pre-syncopal on the day of admission, he checked her BP which was ok and HR was 53 at that time.   She has also had a 20 pound unintentional weight loss since the fall of last year. She denies nausea, vomiting, diarrhea, early satiety.    She currently states that she feels well without chest pain, shortness of breath, recent fevers, chills, nausea or vomiting. She reports compliance with medications.   Echo 03/2016 demonstrated EF [id number], grade 2 diastolic dysfunction, LA severely dilated, RA moderately dilated.  Past Medical History:  Diagnosis Date  . Anemia of chronic disease 07/06/2011  . Aortic atherosclerosis ([place]) 01/06/2015   Seen on CT scan, currently asymptomatic  . Cancer ([place])    uterine( per pt)  . Chronic kidney disease, stage 3 09/01/2008  . Chronic venous insufficiency 10/03/2012  . Essential hypertension 09/19/2006  . History of cervical cancer 08/11/2012   s/p hysterectomy, remote, specifics unknown   . Human immunodeficiency virus (HIV) disease ([place]) 09/12/2006  . 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 07/25/2012  . Low grade squamous intraepithelial lesion (LGSIL) on cervical Pap smear 08/06/2007   LGSIL: 08/06/2007, 11/05/2008, 05/11/2009 ASC-US: 05/27/2008, 08/31/2009, 05/24/2010, 05/23/2012 High Risk HPV: Detected   . Onychomycosis of left great toe 09/09/2015  . Overweight (BMI 25.0-29.9) 07/25/2012     Surgical History:  Past Surgical History:  Procedure Laterality Date  . ABDOMINAL HYSTERECTOMY  Prescriptions Prior to Admission  Medication Sig Dispense Refill Last Dose  . abacavir-dolutegravir-lamiVUDine (TRIUMEQ) 600-50-300 MG tablet Take 1 tablet by mouth daily. 90 tablet 3 06/12/2016 at Unknown time  . amLODipine (NORVASC) 10 MG tablet Take 1 tablet (10 mg total) by mouth  daily. 90 tablet 3 06/12/2016 at Unknown time  . cefadroxil (DURICEF) 500 MG capsule Take 500 mg by mouth daily.   06/12/2016 at Unknown time  . ferrous sulfate 325 (65 FE) MG tablet Take 325 mg by mouth daily with breakfast.   06/12/2016 at Unknown time  . hydrALAZINE (APRESOLINE) 100 MG tablet Take 1 tablet (100 mg total) by mouth 3 (three) times daily. 270 tablet 3 06/12/2016 at Unknown time  . valsartan-hydrochlorothiazide (DIOVAN-HCT) 320-25 MG tablet Take 1 tablet by mouth daily. 90 tablet 3 06/12/2016 at Unknown time    Inpatient Medications:  . abacavir-dolutegravir-lamiVUDine  1 tablet Oral Daily  . cefadroxil  500 mg Oral Daily  . [START ON 06/15/2016] enoxaparin (LOVENOX) injection  30 mg Subcutaneous Q24H  . [START ON 06/15/2016] ferrous sulfate  325 mg Oral Q breakfast  . hydrALAZINE  100 mg Oral TID  . irbesartan  300 mg Oral Daily   And  . hydrochlorothiazide  25 mg Oral Daily  . mouth rinse  15 mL Mouth Rinse BID    Allergies:  Allergies  Allergen Reactions  . Clonidine Derivatives Other (See Comments)    Non-specific heart block    Social History   Social History  . Marital status: Widowed    Spouse name: N/A  . Number of children: N/A  . Years of education: N/A   Occupational History  . Not on file.   Social History Main Topics  . Smoking status: Former Smoker    Types: Cigarettes    Quit date: 08/06/1988  . Smokeless tobacco: Never Used  . Alcohol use No  . Drug use: No  . Sexual activity: Not on file   Other Topics Concern  . Not on file   Social History Narrative  . No narrative on file     Family History  Problem Relation Age of Onset  . Hypertension Mother   . Heart disease Mother   . Hypertension Father   . Heart disease Father   . Hypertension Sister   . HIV/AIDS Daughter   . Early death Sister     Gun shot wound  . Cerebrovascular Accident Son      Review of Systems: All other systems reviewed and are otherwise negative except as  noted above.  Physical Exam: Vitals:   06/14/16 1035 06/14/16 1100 06/14/16 1116 06/14/16 1300  BP: 122/60 (!) 109/46  (!) 137/57  Pulse: (!) 47 61 (!) 59 63  Resp: 16 16 13 15   Temp: 97.9 F (36.6 C)  97.7 F (36.5 C) 98.6 F (37 C)  TempSrc:   Oral Oral  SpO2: 99% 99% 100% 100%  Weight:      Height:        GEN- The patient is elderly appearing, alert and oriented to herself only  HEENT: normocephalic, atraumatic; sclera clear, conjunctiva pink; hearing intact; oropharynx clear; neck supple  Lungs- Clear to ausculation bilaterally, normal work of breathing.  No wheezes, rales, rhonchi Heart- Regular rate and rhythm, 2/6 SEM GI- soft, non-tender, non-distended, bowel sounds present  Extremities- no clubbing, cyanosis, +dependent BLE edema  MS- no significant deformity or atrophy Skin- warm and dry, no rash or lesion Psych- euthymic mood, full affect  Neuro- strength and sensation are intact  Labs:   Lab Results  Component Value Date   WBC 5.6 06/14/2016   HGB 9.5 (L) 06/14/2016   HCT 28.1 (L) 06/14/2016   MCV 105.6 (H) 06/14/2016   PLT 370 06/14/2016     Recent Labs Lab 06/14/16 0436  NA 139  K 3.7  CL 105  CO2 25  BUN 26*  CREATININE 1.46*  CALCIUM 9.4  PROT 6.7  BILITOT 0.4  ALKPHOS 53  ALT 14  AST 16  GLUCOSE 89      Radiology/Studies: Dg Chest 2 View Result Date: 06/13/2016 CLINICAL DATA:  Cough. Near syncope and dizziness beginning 3 hours ago. Shortness of breath. History of chronic kidney disease and HIV infection. EXAM: CHEST  2 VIEW COMPARISON:  03/24/2016 FINDINGS: Mild cardiac enlargement without vascular congestion. No edema or consolidation in the lungs. No blunting of costophrenic angles. No pneumothorax. IMPRESSION: No active cardiopulmonary disease. Electronically Signed   By: Lucienne Capers M.D.   On: 06/13/2016 01:16    [id number] rhythm with junctional escape beats   TELEMETRY: sinus rhythm, sinus brady, junctional escape, Mobitz I    Assessment/Plan: 1.  Sinus bradycardia The patient has a longstanding history of sinus bradycardia (dating back to 2016 and asymptomatic at that time).  She has been noted on telemetry to have sinus brady, junctional escape beats, and Mobitz I heart block but has not had any symptoms of dizziness with these rhythms (reason she came to the hospital).  In the absence of correlation of symptoms with bradycardia, there is no indication for pacemaker. Heart rate was 53 at time of dizzy spell at home per son.  I am not convinced that bradycardia is causing her symptoms Monitor on telemetry overnight and try to correlate symptoms with arrhythmias With advanced age and dementia, she is at increased risk for any procedures.   2.  HIV Management per primary team   3.  Dizziness As above, I am not sure that bradycardia is causing her symptoms. She has had a 20 pound unintentional weight loss since the fall despite normal po intake per family. Will defer work up to primary team. Check orthostatics today Continue to monitor on telemetry  Ambulate with nursing to check HR with activity  4.  Dementia  Worsening per family over the last couple of months  Management per primary team  She currently lives at home alone.   Dr Rayann Heman to see later today.   Signed, Chanetta Marshall, NP 06/14/2016 2:27 PM   I have seen, examined the patient, and reviewed the above assessment and plan.  On exam, she is confused and elderly, NAD, brady regular rhythm  Changes to above are made where necessary.  She has had chronic asymptomatic bradycardia.  I do not think that this is the cause of her current symptoms.  Given recurrent infections (primarily UTIs) and dementia, she is a poor candidate for any EP procedures.  She is clear that she would like to avoid pacing.  I will gently hydrate tonight.   Ambulate in AM and will likely be able to discharge unless hospitalist team feels that she requires additional inpatient  management. I have spoken at length with patient's son who feels that she is safe to return to her previous independent living environment.  Co Sign: Thompson Grayer, MD 06/14/2016 5:22 PM

## 2016-06-14 NOTE — Care Management Note (Signed)
Case Management Note  Patient Details  Name: Karen Luna MRN: [id number] Date of Birth: 24-Dec-1931  Subjective/Objective:      Adm with bradycardia, hx of dementia. Needs pacemaker.        Action/Plan: Awaiting family to arrive at bedside for decision to transfer for pacemaker.   Expected Discharge Date:       06/15/2016           Expected Discharge Plan:  Acute to Acute Transfer  In-House Referral:     Discharge planning Services     Post Acute Care Choice:    Choice offered to:     DME Arranged:    DME Agency:     HH Arranged:    HH Agency:     Status of Service:  In process, will continue to follow  If discussed at Long Length of Stay Meetings, dates discussed:    Additional Comments:  Radonna Bracher, Chauncey Reading, RN 06/14/2016, 9:37 AM

## 2016-06-14 NOTE — Progress Notes (Signed)
PROGRESS NOTE  TAJAE JACEK [id number] DOB: 07/19/1931 DOA: 06/13/2016 PCP: [name], MD  Brief Narrative: 83 year old woman PMH anemia, HIV, presented with episode of dizziness, possible near syncope although history somewhat difficult with question of dementia. Reportedly has been told in the past that she needed a pacemaker. Concern for Wenckebach with profound bradycardia, possible symptomatic bradycardia. Referred for observation for cardiology evaluation and monitoring.  Assessment/Plan 1. Symptomatic bradycardia, near syncope with wandering atrial pacemaker, Mobitz 1, frequent nonconducted PACs, junctional escape beats. Not on any AV node blocking agents. TSH normal.  Agree with cardiology with transferred to [facility] for electrophysiology evaluation.  2. Elevated troponin, asymptomatic. No evidence of ACS. Troponins flat. No further evaluation suggested.  3. Equivocal urinalysis. Not clearly symptomatic, but given dementia will treat.  Follow-up culture data, continue oral antibiotics  4. HIV, stable  Continue outpatient medications  5. Anemia of chronic disease, remains at baseline. 6. Aortic atherosclerosis, asymptomatic 7. Chronic kidney disease stage III. Remains at baseline. 8. Dementia? Recommend outpatient evaluation   Fortunately remained stable. Agree with transfer to [facility] for electrophysiology evaluation. Chronic comorbidities remain stable. TRH will sign off on transfer, please call us if we can be of further assistance.  DVT prophylaxis: Lovenox Code Status: Full Family Communication: Son, daughter-in-law, sister at bedside Disposition Plan:    [name], MD  [facility] Direct contact: [phone] --[address] [place]  --[email or web address]; password [email or web address]  7PM-7AM contact night coverage as above 06/14/2016, 11:04 AM  LOS: 0 days   Consultants:  Cardiology  Procedures:    Antimicrobials:    Interval history/Subjective: No  issues overnight. No pain, dizziness or shortness of breath.  Objective: Vitals:   06/14/16 0700 06/14/16 0734 06/14/16 0800 06/14/16 1035  BP:   126/66 122/60  Pulse: (!) 42 (!) 50 (!) 47 (!) 47  Resp: 14 11 14 16   Temp:  97.9 F (36.6 C)  97.9 F (36.6 C)  TempSrc:  Oral    SpO2: 99% 99% 98% 99%  Weight:      Height:        Intake/Output Summary (Last 24 hours) at 06/14/16 1104 Last data filed at 06/14/16 1025  Gross per 24 hour  Intake             1080 ml  Output             1550 ml  Net             -470 ml     Filed Weights   06/13/16 0030 06/13/16 0624  Weight: 80.7 kg (178 lb) 66.9 kg (147 lb 7.8 oz)    Exam:    Constitutional. Appears calm, comfortable.  Respiratory clear to auscultation bilaterally. No wheezes, rales or rhonchi. Normal respiratory effort.  Cardiovascular bradycardic, 2/6 holosystolic murmur. No rub or gallop. No lower extremity edema.  Alert, speech fluent and clear   I have personally reviewed following labs and imaging studies:  Troponins flat, 0.03  Renal function appears to be at baseline. Remainder CMP unremarkable.  Hemoglobin stable 9.5.  Scheduled Meds: . abacavir-dolutegravir-lamiVUDine  1 tablet Oral Daily  . enoxaparin (LOVENOX) injection  40 mg Subcutaneous Q24H  . hydrALAZINE  100 mg Oral TID  . irbesartan  300 mg Oral Daily   And  . hydrochlorothiazide  25 mg Oral Daily  . mouth rinse  15 mL Mouth Rinse BID   Continuous Infusions: . sodium chloride 10 mL/hr at 06/13/16 0700    Principal  Problem:   Bradycardia Active Problems:   Chronic kidney disease, stage 3   Anemia of chronic disease   Aortic atherosclerosis (HCC)   Near syncope   LOS: 0 days

## 2016-06-14 NOTE — Progress Notes (Addendum)
Progress Note  Patient Name: Karen Luna Date of Encounter: 06/14/2016  Primary Cardiologist: Carlyle Dolly MD  Subjective   No complaints of chest pain, dizziness or dyspnea.   Inpatient Medications    Scheduled Meds: . abacavir-dolutegravir-lamiVUDine  1 tablet Oral Daily  . enoxaparin (LOVENOX) injection  40 mg Subcutaneous Q24H  . hydrALAZINE  100 mg Oral TID  . irbesartan  300 mg Oral Daily   And  . hydrochlorothiazide  25 mg Oral Daily  . mouth rinse  15 mL Mouth Rinse BID   Continuous Infusions: . sodium chloride 10 mL/hr at 06/13/16 0700   PRN Meds: acetaminophen **OR** acetaminophen, ondansetron **OR** ondansetron (ZOFRAN) IV   Vital Signs    Vitals:   06/14/16 0300 06/14/16 0400 06/14/16 0500 06/14/16 0734  BP: (!) 119/43 (!) 92/39 (!) 118/42   Pulse: (!) 45 (!) 42 (!) 37 (!) 50  Resp: 15 14 (!) 24 11  Temp:  98.1 F (36.7 C)  97.9 F (36.6 C)  TempSrc:  Oral  Oral  SpO2: 97% 97% 95% 99%  Weight:      Height:        Intake/Output Summary (Last 24 hours) at 06/14/16 0754 Last data filed at 06/14/16 0300  Gross per 24 hour  Intake              840 ml  Output             1700 ml  Net             -860 ml   Filed Weights   06/13/16 0030 06/13/16 0624  Weight: 178 lb (80.7 kg) 147 lb 7.8 oz (66.9 kg)    Telemetry    Sinus bradycardia, wandering atrial pacemaker, no significant pauses. Rates in the 30 and 40's. - Personally Reviewed  ECG     Personally Reviewed  Physical Exam   GEN: No acute distress.   Neck: No JVD Cardiac: RRR, no murmurs, rubs, or gallops.  Respiratory: Clear to auscultation bilaterally. GI: Soft, nontender, non-distended  MS: No edema; No deformity. Neuro:  Nonfocal  Psych: Normal affect   Labs    Chemistry Recent Labs Lab 06/13/16 0215 06/14/16 0436  NA 138 139  K 4.2 3.7  CL 105 105  CO2 21* 25  GLUCOSE 112* 89  BUN 22* 26*  CREATININE 1.12* 1.46*  CALCIUM 10.3 9.4  PROT 8.0 6.7  ALBUMIN  4.2 3.4*  AST 20 16  ALT 19 14  ALKPHOS 60 53  BILITOT 0.4 0.4  GFRNONAA 44* 32*  GFRAA 52* 37*  ANIONGAP 12 9     Hematology Recent Labs Lab 06/13/16 0215 06/14/16 0436  WBC 7.0 5.6  RBC 2.86* 2.66*  HGB 10.1* 9.5*  HCT 30.4* 28.1*  MCV 106.3* 105.6*  MCH 35.3* 35.7*  MCHC 33.2 33.8  RDW 14.2 14.0  PLT 398 370    Cardiac Enzymes Recent Labs Lab 06/13/16 0459 06/13/16 1101 06/13/16 1640 06/13/16 2313  TROPONINI 0.04* 0.04* 0.03* 0.03*   No results for input(s): TROPIPOC in the last 168 hours.   BNP Recent Labs Lab 06/13/16 0215  BNP 379.0*     DDimer No results for input(s): DDIMER in the last 168 hours.   Radiology    Dg Chest 2 View  Result Date: 06/13/2016 CLINICAL DATA:  Cough. Near syncope and dizziness beginning 3 hours ago. Shortness of breath. History of chronic kidney disease and HIV infection. EXAM: CHEST  2 VIEW  COMPARISON:  03/24/2016 FINDINGS: Mild cardiac enlargement without vascular congestion. No edema or consolidation in the lungs. No blunting of costophrenic angles. No pneumothorax. IMPRESSION: No active cardiopulmonary disease. Electronically Signed   By: Lucienne Capers M.D.   On: 06/13/2016 01:16    Cardiac Studies   Echocardiogram 04/11/2016 Left ventricle: The cavity size was normal. Wall thickness was   normal. Systolic function was vigorous. The estimated ejection   fraction was in the range of 65% to 70%. Wall motion was normal;   there were no regional wall motion abnormalities. Features are   consistent with a pseudonormal left ventricular filling pattern,   with concomitant abnormal relaxation and increased filling   pressure (grade 2 diastolic dysfunction). Doppler parameters are   consistent with high ventricular filling pressure. - Aortic valve: Mildly calcified annulus. Trileaflet; mildly   thickened leaflets. Valve area (VTI): 1.59 cm^2. Valve area   (Vmax): 1.79 cm^2. - Left atrium: The atrium was severely  dilated. - Right atrium: The atrium was moderately dilated. - Atrial septum: No defect or patent foramen ovale was identified. - Pulmonary arteries: Systolic pressure was mildly increased. PA   peak pressure: 39 mm Hg (S). - Technically adequate study.  Patient Profile     83 y.o. female admitted with bradycardia,  with history of syncope, hypertensin, diastolic CHF, dementia.   Assessment & Plan    1. Symptomatic Bradycardia: Hx of syncope. Telemetry demonstrates continued bradycardia with wandering pacemaker, no significant pauses overnight. She has external pacemaker applied. She has dementia and therefore awaiting son to arrive around 9 am to discuss agreement for PPM implant. Amlodipine has been discontinued.   2. Hypertension:  BP is stable, on irbesartan and HCTZ, hydralazine. Creatinine is increased to 1.46 after receiving one dose of IV lasix is the setting of mild edema. .   3. Diastolic CHF: Grade II diastolic dysfunction. No evidence of decompensation at this time.  Not on daily diuretics with the exception of HCTZ. I/O since admission, 1.100.  4. Anemia: Likely related to CKD. Hgb is 9.5 this am, down from 10.1.  On iron replacement at home. Consider restarting.   4. Dementia: She is unable to make decision concerning proceeding with PPM. Awaiting son to discuss wishes of family.   Signed, Jory Sims, NP  06/14/2016, 7:54 AM    Patient seen and discussed with NP Purcell Nails. Several week history of dizziness, preysncope, and syncope.  Telemetry shows sinus bradycardia, wandering atrial pacemaker, second degree AV block type I (Wenchebach),frequent nonconducted PACs that drive her heart rates down to the mid 30s, and at times junctional escape beats and rhythm in the 30-40s. She is not on any av nodal agents at home. TSH is normal. She requires EP evaluation, she may need consideration for possible pacemaker or perhaps suppression of her frequently blocked PACs may be  sufficient to improve her severe bradycardia and symptoms. She herself is somewhat opposed to pacemaker but seems to lack understanding given her dementia. We will discuss with family today   26AM addendum Discussed with patient's son at the bedside. After discussing with him, both he and the patient agree for transfer to [facility] to be evaluated by EP. Despite some memory deficits/dementia patient remains independent. She lives alone, cooks and cleans, grooms and dresses herself. Her episodes of presyncope and syncope are affecting her quality of life, and family has concern about possible future injury. [facility] will [facility] to [facility] for [facility] evaluation.    Carlyle Dolly MD

## 2016-06-14 NOTE — Progress Notes (Signed)
PT BEING TRANSFERRED TO [facility] BY [facility]. VSS. O2 SAT 100% ONROOM AIR. HR 37 TO 53 IN 2ND DEGREE HEART BLOCK TYPE 1. DENIES ANY CHEST PAIN OR SOB. LT AC IV  NSL PATENT. TRANSFER REPORT CALLED TO JESSICA RN ON [facility]WEST.

## 2016-06-15 DIAGNOSIS — Z79899 Other long term (current) drug therapy: Secondary | ICD-10-CM

## 2016-06-15 DIAGNOSIS — F0390 Unspecified dementia without behavioral disturbance: Secondary | ICD-10-CM | POA: Diagnosis not present

## 2016-06-15 DIAGNOSIS — I441 Atrioventricular block, second degree: Secondary | ICD-10-CM | POA: Diagnosis not present

## 2016-06-15 DIAGNOSIS — Z21 Asymptomatic human immunodeficiency virus [HIV] infection status: Secondary | ICD-10-CM

## 2016-06-15 DIAGNOSIS — R001 Bradycardia, unspecified: Secondary | ICD-10-CM | POA: Diagnosis not present

## 2016-06-15 DIAGNOSIS — R42 Dizziness and giddiness: Secondary | ICD-10-CM | POA: Diagnosis not present

## 2016-06-15 DIAGNOSIS — I129 Hypertensive chronic kidney disease with stage 1 through stage 4 chronic kidney disease, or unspecified chronic kidney disease: Secondary | ICD-10-CM

## 2016-06-15 DIAGNOSIS — N183 Chronic kidney disease, stage 3 (moderate): Secondary | ICD-10-CM | POA: Diagnosis not present

## 2016-06-15 DIAGNOSIS — I517 Cardiomegaly: Secondary | ICD-10-CM

## 2016-06-15 DIAGNOSIS — R8271 Bacteriuria: Secondary | ICD-10-CM

## 2016-06-15 MED ORDER — PAROXETINE HCL 10 MG PO TABS: 10.0000 mg | ORAL_TABLET | Freq: Every day | ORAL | 1 refills | Status: DC

## 2016-06-15 MED ORDER — SODIUM CHLORIDE 0.9 % IV BOLUS (SEPSIS): 250.0000 mL | Freq: Once | INTRAVENOUS | Status: DC

## 2016-06-15 MED ORDER — DEXTROSE 5 % IV SOLN
1.0000 g | Freq: Every day | INTRAVENOUS | Status: DC
  Filled 2016-06-15: qty 10

## 2016-06-15 NOTE — Discharge Summary (Signed)
Name: Karen Luna MRN: [id number] DOB: Apr 01, 1932 83 y.o. PCP: [name], MD  Date of Admission: 06/13/2016 12:21 AM Date of Discharge: 06/15/2016 Attending Physician: Annia Belt, MD  Discharge Diagnosis: 1. Dizziness 2. Bradycardia 3. Dementia Principal Problem:   Bradycardia Active Problems:   Chronic kidney disease, stage 3   Anemia of chronic disease   Aortic atherosclerosis (HCC)   Near syncope   Discharge Medications: Allergies as of 06/15/2016      Reactions   Clonidine Derivatives Other (See Comments)   Non-specific heart block      Medication List    STOP taking these medications   amLODipine 10 MG tablet Commonly known as:  NORVASC   cefadroxil 500 MG capsule Commonly known as:  DURICEF     TAKE these medications   abacavir-dolutegravir-lamiVUDine 600-50-300 MG tablet Commonly known as:  TRIUMEQ Take 1 tablet by mouth daily.   ferrous sulfate 325 (65 FE) MG tablet Take 325 mg by mouth daily with breakfast.   hydrALAZINE 100 MG tablet Commonly known as:  APRESOLINE Take 1 tablet (100 mg total) by mouth 3 (three) times daily.   PARoxetine 10 MG tablet Commonly known as:  PAXIL Take 1 tablet (10 mg total) by mouth daily. At night   valsartan-hydrochlorothiazide 320-25 MG tablet Commonly known as:  DIOVAN-HCT Take 1 tablet by mouth daily.       Disposition and follow-up:   Karen Luna was discharged from [facility] in Stable condition.  At the hospital follow up visit please address:  1.   Dizziness: --any further episodes of dizziness? Falls?  HTN: Patient was taken off of amlodipine and continued on hydralazine and valsartan-HCTZ.  --has her BP been stable? --any signs or symptoms of hypertension?  Dementia, depression: --is the patient tolerating paxil well?  Placement: --has patient accepted going to PACE? --will need help filling out paperwork for assisted living facilities; have  already placed SW consult for Karen Luna  2.  Labs / imaging needed at time of follow-up: BMP for renal function  3.  Pending labs/ test needing follow-up: none  Follow-up Appointments: Follow-up Information    [facility] Follow up.   Why:  Registered Nurse and Education officer, museum.  Contact information: [address] [place] [address] [phone]           Hospital Course by problem list: Principal Problem:   Bradycardia Active Problems:   Chronic kidney disease, stage 3   Anemia of chronic disease   Aortic atherosclerosis (HCC)   Near syncope   Karen Luna is a 83yo female with PMH of HIV, dCHF, HTN, CKD 3, dementia, and chronic bradycardia. Patient evaluated at [facility] ED for 3hr h/o of dizziness and dyspnea and was found to be bradycardic. She was transferred to [facility] for eval by EP for possible Psychologist, forensic.   Dizziness: Patient admitted for dizziness thought to be due to bradycardia. EP evaluated patient and did not find that her episodes of dizziness when inpatient correlated to worsening bradycardia from her baseline and recommended correlating her symptoms with arrhythmias. They did not think she was a candidate for pace maker or other EP procedures due to her recurrent UTI's and dementia. She was found to have orthostatic vitals and was given IVF. Patient's HTN meds have been adjusted as her BP has been labile, and she has lost ~20lbs over the last few months. Her amlodipine has been discontinued; we continued the previous  doses of hydralazine and valsartan-HCTZ.   Sinus bradycardia: Patient with history of sinus bradycardia. She was evaluated by EP at [facility] who do not think she is a candidate for pacemaker or other EP procedures. Her episodes of dizziness do not correlate to worsening bradycardia.   Dementia, depression: Patient with worsening issues with memory over the last few months. She has had normal B12, folate, TSH, and nonreactive RPR. On  discussion with patient, she is aware that she is not oriented which upsets her. Family reports that her episodes of worsening confusion are usually what bring her in the ED; son has noted that this usually happens when she is unable to sleep. CT head in Dec 2017 shows extensive subcortical and periventricular white matter hypodensities. PT evaluated patient and recommend 24hr supervision due to her confusion, which we agree with. Patient was previously living alone. She has been accepted to PACE but refused in the past. Family will attempt again to place her in PACE program and also will look into assisted living facilities. HH RN and SW has been ordered. [facility] SW has also been consulted for assistance in paperwork for ALF. Patient was started on Paxil qhs; this can be titrated up as needed.   Discharge Vitals:   BP (!) 173/58 (BP Location: Left Arm)   Pulse (!) 56   Temp 98.5 F (36.9 C) (Oral)   Resp 14   Ht 5\' 7"  (1.702 m)   Wt 142 lb 1.6 oz (64.5 kg)   SpO2 100%   BMI 22.26 kg/m   Pertinent Labs, Studies, and Procedures:  CBC Latest Ref Rng & Units 06/14/2016 06/13/2016 04/10/2016  WBC 4.0 - 10.5 K/uL 5.6 7.0 5.6  Hemoglobin 12.0 - 15.0 g/dL 9.5(L) 10.1(L) 9.3(L)  Hematocrit 36.0 - 46.0 % 28.1(L) 30.4(L) 28.1(L)  Platelets 150 - 400 K/uL 370 398 446(H)   BMP Latest Ref Rng & Units 06/14/2016 06/13/2016 04/09/2016  Glucose 65 - 99 mg/dL 89 112(H) 83  BUN 6 - 20 mg/dL 26(H) 22(H) 21(H)  Creatinine 0.44 - 1.00 mg/dL 1.46(H) 1.12(H) 1.51(H)  BUN/Creat Ratio 11 - 26 - - -  Sodium 135 - 145 mmol/L 139 138 140  Potassium 3.5 - 5.1 mmol/L 3.7 4.2 4.3  Chloride 101 - 111 mmol/L 105 105 110  CO2 22 - 32 mmol/L 25 21(L) 24  Calcium 8.9 - 10.3 mg/dL 9.4 10.3 9.1   TSH 06/13/2016: 2.09  Discharge Instructions: Discharge Instructions    Ambulatory referral to Social Work    Complete by:  As directed    Patient and family are interested in applying for assisted living facilities (pos through  [facility]). She has also been accepted into the [facility] program   Call MD for:  difficulty breathing, headache or visual disturbances    Complete by:  As directed    Call MD for:  extreme fatigue    Complete by:  As directed    Call MD for:  persistant nausea and vomiting    Complete by:  As directed    Call MD for:  severe uncontrolled pain    Complete by:  As directed    Call MD for:  temperature >100.4    Complete by:  As directed    Diet - low sodium heart healthy    Complete by:  As directed    Discharge instructions    Complete by:  As directed    For blood pressure: --stop amlodipine --continue hydralazine 100mg  TID, valsartan-HCTZ 320-25mg  daily  For depression and sleep: --Start Paxil 10mg  at night --this medicine has a sedating effect so take it at night time --it may take up to 4-6 weeks for full effect to take place  You should be contacted about setting up times for Home Health nurse and social work.  We will work on getting started on paperwork for assisted living facilities at your follow up appointment.   Increase activity slowly    Complete by:  As directed      Signed: Alphonzo Grieve, MD 06/15/2016, 5:19 PM   Pager [phone]

## 2016-06-15 NOTE — Progress Notes (Addendum)
Transfer note  83 yo F with dementia, CKD III, HIV, LVH, diastolic CHF with severely dilated LA, chronic bradycardia, who presented to the [facility] ED on 2/14 with dizziness and near syncope. Had syncope on December at [place] and was told that she may need a pacemaker.  Ok EKG she had second degree type I heart block, wandering arterial pacemaker,  HR going to mid 30's, at times junctional escape beats. Was not on AV nodal blockers at home. Cards talked the family and convinced them for PPM placement and transfer to [facility] for EP evaluation. [facility], was seen by Dr. Rayann Heman of EP. EP thinks that he bradycardia events are not related to her symptoms b/c her HR was 53 when she was dizzy at home per son. They recommended continuing to correlate symptoms with arrythmias.   orthostat + (systolic BP dropped from [id number] to 103) yesterday.  Subjective:   Patient is tearful and confused about why she is in the hospital. Does not remember what happened. No agitation overnight. Remains in sinus brady. Vitals otherwise stable. Denies any chest pain, sob, dizziness, dysuria, or any other symptoms.  Objective:  Vital signs in last 24 hours: Vitals:   06/14/16 1609 06/14/16 2018 06/14/16 2110 06/15/16 0428  BP: 113/89  126/62 (!) 148/77  Pulse:    99  Resp:    10  Temp:  98.1 F (36.7 C)  98.3 F (36.8 C)  TempSrc:  Oral  Oral  SpO2:    100%  Weight:    142 lb 1.6 oz (64.5 kg)  Height:       Vitals reviewed. General: resting in bed, tearful, NAD HEENT: PERRL, EOMI, no scleral icterus Cardiac: slow rate, no m/r/g Pulm: clear to auscultation bilaterally, no wheezes, rales, or rhonchi Abd: soft, nontender, nondistended, BS present Ext: warm and well perfused, no pedal edema Neuro: alert to self, tearful, confused, does not know why she is here. Is able to name her son.    Assessment/Plan:  Principal Problem:   Bradycardia Active Problems:   Chronic kidney disease, stage 3  Anemia of chronic disease   Aortic atherosclerosis (HCC)   Near syncope  83 yo female with chronic sinus bradycardia presents with dizziness.  Dizziness Could be 2/2 to orthostatic hypotension. +orthostat yesterday. She has had sinus brady chronically but it has not been symptomatic in the past and does not clearly correlate with her dizziness. EP does not think her dizziness is cardiogenic. Recommends no pacemaker at this time. Her orthostatic hypotension could be 2/2 to volume depletion or from autonomic dysfunction from her dementia. - bolus 250cc today. Recheck orthostatic vitals. - work with PT to make sure she will be safe at home. She lives by herself currently.  - hold HCTZ and amlodipine.   Sinus brady - with type 2 block on admission. Not on AV nodal blockers. No signs of ischemia (trop only 0.04 peaked). EP evaluated and recs no pace maker at this oint gvien her dementia and her dizziness not being directly related to her sinus brady. - cont to monitor. Avoid AV nodal blockers.  Dementia -  patient has significant dementia affecting her orientation. She currently lives by herself. Her son comes to check on her. However, given her dizziness and disorientation, it may not be safe for her to live by herself anymore. We will ask PT to evaluate and give recs. - will talk to the son regarding dispo after PT eval is done.  HTN -  -cont valsartan.  - Hold hctz and amlodipine as above. She is getting dizzy, we may need to set higher BP goal for her.  HIV - well controlled, cont home meds.   equivocal UA -(mod leuk, few bactereia)  patient was treated with her equivocal UA for possible UTI given her dementia. However, she denies any Urinary symptoms and ucx showed multiple species. - Will d/c ceftriaxone.   Dispo: Anticipated discharge in approximately 1-2 day(s).   Dellia Nims, MD 06/15/2016, 7:26 AM Pager: [phone]

## 2016-06-15 NOTE — Care Management Note (Addendum)
Case Management Note  Patient Details  Name: Karen Luna MRN: [id number] Date of Birth: 1932-01-26  Subjective/Objective: Pt transferred from [facility] due to bradycardia. Plan was for possible PPM. Pt is from home alone. Pt has support of son [name] that is at bedside.                    Action/Plan: CM did discuss with family in regards to plan of care. Pt is from home and has worsening dementia. Per son pt has been fine at home, however has shown some signs of dementia. CM discussed [facility] and how often an agency would be in the home. PT recommendations for 24 hour supervision. Pt's son currently works from 7 am -3:00 pm. No other family to provide the 24 hour supervision. CM did speak with pt in regards to Medicaid Benefit could allow for long term facility placement. We also discussed [facility]- CM did call [facility]- received voicemail. Awaiting call back in regards to disposition needs.  Expected Discharge Date:                  Expected Discharge Plan:  [place]  In-House Referral:  NA  Discharge planning Services  CM Consult  Post Acute Care Choice:  Home Health Choice offered to:   Patient, Adult Children  DME Arranged:   N/A DME Agency:   N/A  HH Arranged:   RN, SW HH Agency:   [facility]  Status of Service:  Completed.  If discussed at [place] of Stay Meetings, dates discussed:    Additional Comments: 1645 06-15-16 Jacqlyn Krauss, RN, BSN [phone] CM did speak with Edwin Cap of PACE- pt has been approved for PACE, however pt declined once process had been completed. Pt has FL2 and physical has been completed. [facility] is connected with the [facility] [place]. Family in agreement with [facility]. Agency List provided and pt chose [facility] has used in the past. Services for [employer] and SW- to assist with placement in [facility]. CM did reach out to MD in regards that paperwork will need to be  completed for assistance to [facility]. CSW did provide pt with information to family in regards to ALF's. Patient's son [name] will provide 24 hour supervision. No further needs from CM at this time. Bethena Roys, RN 06/15/2016, 3:37 PM

## 2016-06-15 NOTE — Progress Notes (Signed)
SUBJECTIVE: The patient is confused.  She is not aware that she is in the hospital.  No acute complaints.  Marland Kitchen abacavir-dolutegravir-lamiVUDine  1 tablet Oral Daily  . cefTRIAXone (ROCEPHIN)  IV  1 g Intravenous Daily  . enoxaparin (LOVENOX) injection  30 mg Subcutaneous Q24H  . ferrous sulfate  325 mg Oral Q breakfast  . hydrALAZINE  100 mg Oral TID  . irbesartan  300 mg Oral Daily  . mouth rinse  15 mL Mouth Rinse BID   . sodium chloride 10 mL/hr at 06/13/16 0700    OBJECTIVE: Physical Exam: Vitals:   06/14/16 1609 06/14/16 2018 06/14/16 2110 06/15/16 0428  BP: 113/89  126/62 (!) 148/77  Pulse:    99  Resp:    10  Temp:  98.1 F (36.7 C)  98.3 F (36.8 C)  TempSrc:  Oral  Oral  SpO2:    100%  Weight:    142 lb 1.6 oz (64.5 kg)  Height:        Intake/Output Summary (Last 24 hours) at 06/15/16 0756 Last data filed at 06/15/16 0319  Gross per 24 hour  Intake              942 ml  Output             1400 ml  Net             -458 ml    Telemetry reveals sinus rhythm with frequent PACs.  I do not see any afib.  There is sinus with nonconduction PACs (Brief) which is misinterpreted as mobitz II AV block.  I do not see any advanced AV block.  Heart rates are mostly 60s-70s   GEN- The patient is very confused this am Head- normocephalic, atraumatic Eyes-  Sclera clear, conjunctiva pink Ears- hearing intact Oropharynx- clear Neck- supple,   Lungs- Clear to ausculation bilaterally, normal work of breathing Heart- Regular rate and rhythm  GI- soft, NT, ND, + BS Extremities- no clubbing, cyanosis, or edema Skin- no rash or lesion Psych- euthymic mood, full affect Neuro- strength and sensation are intact  LABS: Basic Metabolic Panel:  Recent Labs  06/13/16 0215 06/14/16 0436  NA 138 139  K 4.2 3.7  CL 105 105  CO2 21* 25  GLUCOSE 112* 89  BUN 22* 26*  CREATININE 1.12* 1.46*  CALCIUM 10.3 9.4   Liver Function Tests:  Recent Labs  06/13/16 0215  06/14/16 0436  AST 20 16  ALT 19 14  ALKPHOS 60 53  BILITOT 0.4 0.4  PROT 8.0 6.7  ALBUMIN 4.2 3.4*   No results for input(s): LIPASE, AMYLASE in the last 72 hours. CBC:  Recent Labs  06/13/16 0215 06/14/16 0436  WBC 7.0 5.6  NEUTROABS 4.3  --   HGB 10.1* 9.5*  HCT 30.4* 28.1*  MCV 106.3* 105.6*  PLT 398 370   Cardiac Enzymes:  Recent Labs  06/13/16 1101 06/13/16 1640 06/13/16 2313  TROPONINI 0.04* 0.03* 0.03*   Thyroid Function Tests:  Recent Labs  06/13/16 1101  TSH 2.090    RADIOLOGY: Dg Chest 2 View  Result Date: 06/13/2016 CLINICAL DATA:  Cough. Near syncope and dizziness beginning 3 hours ago. Shortness of breath. History of chronic kidney disease and HIV infection. EXAM: CHEST  2 VIEW COMPARISON:  03/24/2016 FINDINGS: Mild cardiac enlargement without vascular congestion. No edema or consolidation in the lungs. No blunting of costophrenic angles. No pneumothorax. IMPRESSION: No active cardiopulmonary disease. Electronically Signed  By: Lucienne Capers M.D.   On: 06/13/2016 01:16    ASSESSMENT AND PLAN:   1.  Sinus bradycardia The patient has a longstanding history of sinus bradycardia (dating back to 2016 and asymptomatic at that time). Her heart rates are mostly stable and she has had no symptoms with arrhythmia overnight.   No indication for pacing presently.  I do not think that this is the cause of her current symptoms.  Given recurrent infections (primarily UTIs) and dementia, she is a poor candidate for any EP procedures.  She is clear that she would like to avoid pacing.   2.  HIV Management per primary team   3.  Dizziness Documented to be orthostatic yesterday.  She has had significant weight loss and failure to thrive.  IV hydrated yesterday afternoon.    4.  Dementia  Worsening per family over the last couple of months  Management per primary team  She currently lives at home alone.  Her family's preference is that she return to home  if able  OK to discharge from EP standpoint once other medicine issues are stable.  Electrophysiology team to see as needed while here. Please call with questions.   Thompson Grayer, MD 06/15/2016 7:56 AM

## 2016-06-15 NOTE — Progress Notes (Signed)
D/c instructions reviewed with pt and her son. Copy of instructions given to pt/son. Script sent to pt's pharmacy by MD. CM & SW have spoke with pt/son on discharge needs, [facility] and SW have been arranged for home. Pt d/c'd via wheelchair with belongings with family escorted by unit NT.

## 2016-06-15 NOTE — Clinical Social Work Note (Signed)
CSW spoke with patient's son. Patient's son [name] states that he plans to bring the patient home with him to provide supervision. CSW signing off.  Liz Beach MSW, [place], [place], [id number]

## 2016-06-15 NOTE — Evaluation (Signed)
Physical Therapy Evaluation Patient Details Name: Karen Luna MRN: [id number] DOB: 12/19/1931 Today's Date: 06/15/2016   History of Present Illness  Karen Luna  is a 83 y.o. female, With history of anemia, COPD stage III, HIV who came to hospital with episode of near syncope.  Clinical Impression  Pt admitted with above diagnosis. Pt currently with functional limitations due to the deficits listed below (see PT Problem List).  Pt will benefit from skilled PT to increase their independence and safety with mobility to allow discharge to the venue listed below.  Pt ambulating with min/guard to S level with no AD with HR 66- 93 bpm.  Orthostatics:  Supine 153/75 hr 62 Sitting 151/85 HR 53 Standing 160/76 HR 70 Standing after 3 mins 131/71 HR 65  Pt tearful and reports being confused throughout eval, not realizing she was in hospital or why she was here. Pt unable to remember after being cued earlier in session. At this time recommend 24 hour S due to decreased safety awareness with decreased recall.       Follow Up Recommendations Supervision/Assistance - 24 hour;No PT follow up    Equipment Recommendations  None recommended by PT    Recommendations for Other Services       Precautions / Restrictions Precautions Precautions: Fall Restrictions Weight Bearing Restrictions: No      Mobility  Bed Mobility Overal bed mobility: Independent                Transfers Overall transfer level: Needs assistance   Transfers: Sit to/from Stand Sit to Stand: Supervision            Ambulation/Gait Ambulation/Gait assistance: Min guard;Supervision Ambulation Distance (Feet): 150 Feet Assistive device: None Gait Pattern/deviations: Decreased step length - right;Decreased step length - left Gait velocity: decreased   General Gait Details: Pt with decreased step length. HR 66-93 during gait  Stairs            Wheelchair Mobility    Modified Rankin (Stroke  Patients Only)       Balance Overall balance assessment: History of Falls (syncope)                                           Pertinent Vitals/Pain Pain Assessment: No/denies pain    Home Living Family/patient expects to be discharged to:: Private residence Living Arrangements: Alone Available Help at Discharge: Family;Available PRN/intermittently Type of Home: House Home Access: Stairs to enter   [employer] of Steps: 2 steps.  Home Layout: One level Home Equipment: None Additional Comments: Info from previous admission, but consistent with what pt said today    Prior Function Level of Independence: Independent               Hand Dominance        Extremity/Trunk Assessment   Upper Extremity Assessment Upper Extremity Assessment: Overall WFL for tasks assessed    Lower Extremity Assessment Lower Extremity Assessment: Overall WFL for tasks assessed    Cervical / Trunk Assessment Cervical / Trunk Assessment: Normal  Communication   Communication: No difficulties  Cognition     Overall Cognitive Status: Impaired/Different from baseline Area of Impairment: Orientation;Safety/judgement;Memory;Attention;Awareness Orientation Level: Disoriented to;Place;Time;Situation Current Attention Level: Focused Memory: Decreased short-term memory   Safety/Judgement: Decreased awareness of safety Awareness: Intellectual   General Comments: Upon entry, pt tearful stating, "I'm confused".  Crying  at various times during session.    General Comments General comments (skin integrity, edema, etc.): orthostatics taken with nurse in room and entered into computer system.  No dizziness noted.    Exercises     Assessment/Plan    PT Assessment Patient needs continued PT services  PT Problem List Decreased activity tolerance;Decreased balance;Decreased mobility;Decreased safety awareness          PT Treatment Interventions Gait  training;Stair training;Functional mobility training;Balance training    PT Goals (Current goals can be found in the Care Plan section)  Acute Rehab PT Goals Patient Stated Goal: see her son and have him take her out of here PT Goal Formulation: Patient unable to participate in goal setting Time For Goal Achievement: 06/29/16 Potential to Achieve Goals: Good    Frequency Min 3X/week   Barriers to discharge Decreased caregiver support      Co-evaluation               End of Session Equipment Utilized During Treatment: Gait belt Activity Tolerance: Patient tolerated treatment well Patient left: in chair;with call bell/phone within reach;with chair alarm set Nurse Communication: Mobility status    Functional Assessment Tool Used: clinical judgement and objective findings Functional Limitation: Mobility: Walking and moving around Mobility: Walking and Moving Around Current Status [phone]): At least 1 percent but less than 20 percent impaired, limited or restricted Mobility: Walking and Moving Around Goal Status [phone]): At least 1 percent but less than 20 percent impaired, limited or restricted    Time: 0841-0909 PT Time Calculation (min) (ACUTE ONLY): 28 min   Charges:   PT Evaluation $PT Eval Low Complexity: 1 Procedure PT Treatments $Gait Training: 8-22 mins   PT G Codes:   PT G-Codes **NOT FOR INPATIENT CLASS** Functional Assessment Tool Used: clinical judgement and objective findings Functional Limitation: Mobility: Walking and moving around Mobility: Walking and Moving Around Current Status [phone]): At least 1 percent but less than 20 percent impaired, limited or restricted Mobility: Walking and Moving Around Goal Status [phone]): At least 1 percent but less than 20 percent impaired, limited or restricted    [facility] LUBECK 06/15/2016, 10:46 AM

## 2016-06-18 DIAGNOSIS — D500 Iron deficiency anemia secondary to blood loss (chronic): Secondary | ICD-10-CM | POA: Diagnosis not present

## 2016-06-18 DIAGNOSIS — I5032 Chronic diastolic (congestive) heart failure: Secondary | ICD-10-CM | POA: Diagnosis not present

## 2016-06-18 DIAGNOSIS — I130 Hypertensive heart and chronic kidney disease with heart failure and stage 1 through stage 4 chronic kidney disease, or unspecified chronic kidney disease: Secondary | ICD-10-CM | POA: Diagnosis not present

## 2016-06-18 DIAGNOSIS — J449 Chronic obstructive pulmonary disease, unspecified: Secondary | ICD-10-CM | POA: Diagnosis not present

## 2016-06-18 DIAGNOSIS — I700 Atherosclerosis of aorta: Secondary | ICD-10-CM | POA: Diagnosis not present

## 2016-06-18 DIAGNOSIS — N183 Chronic kidney disease, stage 3 (moderate): Secondary | ICD-10-CM | POA: Diagnosis not present

## 2016-06-18 DIAGNOSIS — R001 Bradycardia, unspecified: Secondary | ICD-10-CM | POA: Diagnosis not present

## 2016-06-18 DIAGNOSIS — B20 Human immunodeficiency virus [HIV] disease: Secondary | ICD-10-CM | POA: Diagnosis not present

## 2016-06-18 DIAGNOSIS — J069 Acute upper respiratory infection, unspecified: Secondary | ICD-10-CM | POA: Diagnosis not present

## 2016-06-21 ENCOUNTER — Ambulatory Visit (INDEPENDENT_AMBULATORY_CARE_PROVIDER_SITE_OTHER): Payer: Medicare HMO | Admitting: Internal Medicine

## 2016-06-21 DIAGNOSIS — F323 Major depressive disorder, single episode, severe with psychotic features: Secondary | ICD-10-CM | POA: Diagnosis not present

## 2016-06-21 DIAGNOSIS — J3489 Other specified disorders of nose and nasal sinuses: Secondary | ICD-10-CM | POA: Diagnosis not present

## 2016-06-21 DIAGNOSIS — I5032 Chronic diastolic (congestive) heart failure: Secondary | ICD-10-CM

## 2016-06-21 DIAGNOSIS — Z79899 Other long term (current) drug therapy: Secondary | ICD-10-CM | POA: Diagnosis not present

## 2016-06-21 DIAGNOSIS — R0981 Nasal congestion: Secondary | ICD-10-CM

## 2016-06-21 DIAGNOSIS — Z21 Asymptomatic human immunodeficiency virus [HIV] infection status: Secondary | ICD-10-CM | POA: Diagnosis not present

## 2016-06-21 DIAGNOSIS — I130 Hypertensive heart and chronic kidney disease with heart failure and stage 1 through stage 4 chronic kidney disease, or unspecified chronic kidney disease: Secondary | ICD-10-CM | POA: Diagnosis not present

## 2016-06-21 DIAGNOSIS — Z5189 Encounter for other specified aftercare: Secondary | ICD-10-CM | POA: Diagnosis not present

## 2016-06-21 DIAGNOSIS — Z87891 Personal history of nicotine dependence: Secondary | ICD-10-CM

## 2016-06-21 DIAGNOSIS — Z8679 Personal history of other diseases of the circulatory system: Secondary | ICD-10-CM | POA: Diagnosis not present

## 2016-06-21 DIAGNOSIS — I10 Essential (primary) hypertension: Secondary | ICD-10-CM

## 2016-06-21 DIAGNOSIS — R001 Bradycardia, unspecified: Secondary | ICD-10-CM | POA: Diagnosis not present

## 2016-06-21 DIAGNOSIS — N183 Chronic kidney disease, stage 3 (moderate): Secondary | ICD-10-CM | POA: Diagnosis not present

## 2016-06-21 MED ORDER — AMLODIPINE BESYLATE 5 MG PO TABS
5.0000 mg | ORAL_TABLET | Freq: Every day | ORAL | 11 refills | Status: DC
Start: 2016-06-21 — End: 2019-03-16

## 2016-06-21 NOTE — Patient Instructions (Signed)
It was a pleasure to meet you today Karen Luna,   For your high blood pressure,  Start taking amlodipine 5 mg (you were previously on a 10 mg dose)   For your sinus congestion,  Try taking a nasal spray like Nasacort, this is an over the counter medication that may help with the feeling that your having   If there is any delay in your PACE appointment scheduled in two weeks please schedule an appointment to be seen at this clinic at that time again so that we can check your blood pressure

## 2016-06-21 NOTE — Progress Notes (Signed)
CC: follow up for depression, hospital follow up   HPI: [name] is a 83 y.o. with past medical history chronic diastolic dysfunction, hypertension, and chronic bradycardia, CKD 3, and HIV who presents to clinic for follow up after recent hopitalization. Hospitalized 2/14-2/16 for dizziness suspected to be related to orthostatic hypotension. She has a longstanding history of bradycardia but this was not felt to correlate with her dizziness, EP felt she was not a candidate for pacemaker. At discharge amlodipine was held and hydralazine and valsartan-HCTZ were continued. Today she says her dizziness has improved and she has had no further falls.   During hospitalization she also had clinical depression and worsening of baseline confusion and memory loss with suspicion for pseudodementia. She was started on paxil. Today she says that her mood has improved, her daughter is with her at this visit and agrees. They still report difficulties with memory.   She has had sinus congestion and pressure. Denies cough, sore throat, or nasal congestion. They have not tried anything for with concern that a medication may worsen her memory.    Please see problem list for status of the pt's chronic medical problems.  Past Medical History:  Diagnosis Date  . Anemia of chronic disease 07/06/2011  . Aortic atherosclerosis ([place]) 01/06/2015   Seen on CT scan, currently asymptomatic  . Cancer ([place])    uterine( per pt)  . Chronic kidney disease, stage 3 09/01/2008  . Chronic venous insufficiency 10/03/2012  . Essential hypertension 09/19/2006  . History of cervical cancer 08/11/2012   s/p hysterectomy, remote, specifics unknown   . Human immunodeficiency virus (HIV) disease ([place]) 09/12/2006  . 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 07/25/2012  . Low grade squamous intraepithelial lesion (LGSIL) on cervical Pap smear 08/06/2007   LGSIL: 08/06/2007, 11/05/2008, 05/11/2009 ASC-US: 05/27/2008, 08/31/2009,  05/24/2010, 05/23/2012 High Risk HPV: Detected   . Onychomycosis of left great toe 09/09/2015  . Overweight (BMI 25.0-29.9) 07/25/2012    Review of Systems:  Please see each problem below for a pertinent review of systems.  Physical Exam:  Vitals:   06/21/16 1421 06/21/16 1431  BP: (!) 202/66 (!) 189/71  Pulse: 75   Temp: 98.5 F (36.9 C)   TempSrc: Oral   SpO2: 100%   Weight: 149 lb 12.8 oz (67.9 kg)    Physical Exam  Constitutional: She appears well-developed and well-nourished. No distress.  HENT:  Mouth/Throat: No oropharyngeal exudate.  No maxillary or frontal sinus tenderness   Cardiovascular: Normal rate and regular rhythm.   Murmur heard. Systolic murmur heard loudest over right upper sternal border  1+ bilateral lower extremity pitting edema   Pulmonary/Chest: Effort normal. No respiratory distress.  Lymphadenopathy:    She has no cervical adenopathy.  Neurological: She is alert.  Skin: She is not diaphoretic.  Psychiatric: She has a normal mood and affect. Her behavior is normal.   Assessment & Plan:   See Encounters Tab for problem based charting.  Hypertension  Recently hospitalized for dizziness suspected to be related to orthostatic hypotension. Prior to hospitalization she had been taking amlodipine 10 mg daily, hydralizine 100 mg TID, and valsartan-HCTZ 320-25 mg daily. At discharge amlodipine was discontinued. Today she is hypertensive, initial blood pressure was 202/66 with repeat 189/71. We will restart amlodipine at a lower dose today. She has a component of vascular dementia and would benefit from better bp control. I am concerned that she could develop orthostatic hypotension again and have precautioned her on  this.  - prescribed amlodipine 5 mg daily  -continue hydralizine 100 mg TID and valsartan-HCTZ 320-25 mg daily -[facility] and will follow up with them March 8th  Depression  Reports improvement on current regiment. She is not  tearful on exam today.  - continue paxil 10 mg qHS    Sinus congestion  Describes sinus congestion with no sinus tenderness illicited on exam today. There is no need for antibiotics at this time.  - trial of nasonex spray   Patient discussed with Dr. Eppie Gibson

## 2016-06-22 DIAGNOSIS — I130 Hypertensive heart and chronic kidney disease with heart failure and stage 1 through stage 4 chronic kidney disease, or unspecified chronic kidney disease: Secondary | ICD-10-CM | POA: Diagnosis not present

## 2016-06-22 DIAGNOSIS — R001 Bradycardia, unspecified: Secondary | ICD-10-CM | POA: Diagnosis not present

## 2016-06-22 DIAGNOSIS — J069 Acute upper respiratory infection, unspecified: Secondary | ICD-10-CM | POA: Diagnosis not present

## 2016-06-22 DIAGNOSIS — D500 Iron deficiency anemia secondary to blood loss (chronic): Secondary | ICD-10-CM | POA: Diagnosis not present

## 2016-06-22 DIAGNOSIS — I700 Atherosclerosis of aorta: Secondary | ICD-10-CM | POA: Diagnosis not present

## 2016-06-22 DIAGNOSIS — J449 Chronic obstructive pulmonary disease, unspecified: Secondary | ICD-10-CM | POA: Diagnosis not present

## 2016-06-22 DIAGNOSIS — B20 Human immunodeficiency virus [HIV] disease: Secondary | ICD-10-CM | POA: Diagnosis not present

## 2016-06-22 DIAGNOSIS — N183 Chronic kidney disease, stage 3 (moderate): Secondary | ICD-10-CM | POA: Diagnosis not present

## 2016-06-22 DIAGNOSIS — I5032 Chronic diastolic (congestive) heart failure: Secondary | ICD-10-CM | POA: Diagnosis not present

## 2016-06-27 DIAGNOSIS — R0981 Nasal congestion: Secondary | ICD-10-CM | POA: Insufficient documentation

## 2016-06-27 NOTE — Assessment & Plan Note (Signed)
During hospitalization she also had clinical depression and worsening of baseline confusion and memory loss with suspicion for pseudodementia. She was started on paxil. Today she says that her mood has improved, her daughter is with her at this visit and agrees. They report continued difficulties with memory.   - continue paxil 10 mg qHS

## 2016-06-27 NOTE — Progress Notes (Signed)
Case discussed with Dr. Blum at the time of the visit. We reviewed the resident's history and exam and pertinent patient test results. I agree with the assessment, diagnosis, and plan of care documented in the resident's note. 

## 2016-06-27 NOTE — Assessment & Plan Note (Signed)
She has had sinus congestion and pressure. Denies cough, sore throat, or nasal congestion. They have not tried anything for with concern that a medication may worsen her memory.    Describes sinus congestion with no sinus tenderness illicited on exam today. There is no need for antibiotics at this time.  - trial of nasonex spray

## 2016-06-27 NOTE — Assessment & Plan Note (Addendum)
Recently hospitalized for dizziness suspected to be related to orthostatic hypotension. Prior to hospitalization she had been taking amlodipine 10 mg daily, hydralizine 100 mg TID, and valsartan-HCTZ 320-25 mg daily. At discharge amlodipine was discontinued. Since discharge dizziness has resolved. Today she is hypertensive, initial blood pressure was 202/66 with repeat 189/71. We will restart amlodipine at a lower dose today. She has a component of vascular dementia and would benefit from better bp control. I am concerned that she could develop orthostatic hypotension again and have precautioned her on this.  - prescribed amlodipine 5 mg daily  -continue hydralizine 100 mg TID and valsartan-HCTZ 320-25 mg daily -[facility] with [facility] and will follow up with them March 8th

## 2016-06-29 ENCOUNTER — Other Ambulatory Visit (HOSPITAL_COMMUNITY): Payer: Self-pay | Admitting: Internal Medicine

## 2016-06-29 DIAGNOSIS — J449 Chronic obstructive pulmonary disease, unspecified: Secondary | ICD-10-CM | POA: Diagnosis not present

## 2016-06-29 DIAGNOSIS — R011 Cardiac murmur, unspecified: Secondary | ICD-10-CM

## 2016-06-29 DIAGNOSIS — I5032 Chronic diastolic (congestive) heart failure: Secondary | ICD-10-CM | POA: Diagnosis not present

## 2016-06-29 DIAGNOSIS — D500 Iron deficiency anemia secondary to blood loss (chronic): Secondary | ICD-10-CM | POA: Diagnosis not present

## 2016-06-29 DIAGNOSIS — N183 Chronic kidney disease, stage 3 (moderate): Secondary | ICD-10-CM | POA: Diagnosis not present

## 2016-06-29 DIAGNOSIS — B20 Human immunodeficiency virus [HIV] disease: Secondary | ICD-10-CM | POA: Diagnosis not present

## 2016-06-29 DIAGNOSIS — R001 Bradycardia, unspecified: Secondary | ICD-10-CM | POA: Diagnosis not present

## 2016-06-29 DIAGNOSIS — I130 Hypertensive heart and chronic kidney disease with heart failure and stage 1 through stage 4 chronic kidney disease, or unspecified chronic kidney disease: Secondary | ICD-10-CM | POA: Diagnosis not present

## 2016-06-29 DIAGNOSIS — J069 Acute upper respiratory infection, unspecified: Secondary | ICD-10-CM | POA: Diagnosis not present

## 2016-06-29 DIAGNOSIS — I700 Atherosclerosis of aorta: Secondary | ICD-10-CM | POA: Diagnosis not present

## 2016-07-05 ENCOUNTER — Ambulatory Visit: Payer: Self-pay | Admitting: Internal Medicine

## 2016-07-06 ENCOUNTER — Ambulatory Visit: Payer: Self-pay | Admitting: Internal Medicine

## 2016-07-17 ENCOUNTER — Ambulatory Visit (INDEPENDENT_AMBULATORY_CARE_PROVIDER_SITE_OTHER): Payer: Medicare (Managed Care) | Admitting: Internal Medicine

## 2016-07-17 ENCOUNTER — Encounter: Payer: Self-pay | Admitting: Internal Medicine

## 2016-07-17 VITALS — BP 171/64 | HR 75 | Temp 98.1°F | Wt 143.0 lb

## 2016-07-17 DIAGNOSIS — R410 Disorientation, unspecified: Secondary | ICD-10-CM | POA: Diagnosis not present

## 2016-07-17 DIAGNOSIS — B20 Human immunodeficiency virus [HIV] disease: Secondary | ICD-10-CM

## 2016-07-17 DIAGNOSIS — N183 Chronic kidney disease, stage 3 unspecified: Secondary | ICD-10-CM

## 2016-07-17 DIAGNOSIS — N390 Urinary tract infection, site not specified: Secondary | ICD-10-CM | POA: Insufficient documentation

## 2016-07-17 DIAGNOSIS — F0390 Unspecified dementia without behavioral disturbance: Secondary | ICD-10-CM | POA: Diagnosis not present

## 2016-07-17 NOTE — Progress Notes (Signed)
Patient ID: Karen Luna, female   DOB: Nov 15, 1931, 83 y.o.   MRN: [id number]  Subjective:    Patient ID: Karen Luna, female    DOB: Dec 30, 1931, 83 y.o.   MRN: [id number]  HPI She comes in for routine follow up of HIV.  On Triumeq and doing well.  No missed doses.  CD4 270, viral load undetectable last visit.  No new issues. Creat has remained stable, and was 1.46 last month.  Continues to have some weight loss.   Has recently been hospitalized several times.  First was told it was a "UTI" but did not appear to have any symptoms according to her daughter in law present during the visit and then brought in for symptomatic bradycardia.  No dizziness or other symptoms now.  Daughter in law is asking to have urine checked for a UTI.  No dysuria, no pyuria, no fever.     Review of Systems  Constitutional: Negative for appetite change, fatigue and fever.  HENT: Negative for trouble swallowing.   Gastrointestinal: Negative for diarrhea.  Genitourinary: Negative for decreased urine volume, difficulty urinating, dysuria and urgency.  Skin: Negative for rash.  Neurological: Negative for dizziness.       Objective:   Physical Exam  Constitutional: She appears well-developed and well-nourished. No distress.  HENT:  Mouth/Throat: Oropharynx is clear and moist. No oropharyngeal exudate.  Cardiovascular: Normal rate, regular rhythm and normal heart sounds.  Exam reveals no gallop and no friction rub.   No murmur heard. Pulmonary/Chest: Effort normal and breath sounds normal. No respiratory distress.  Lymphadenopathy:    She has no cervical adenopathy.   Social History   Social History  . Marital status: Widowed    Spouse name: N/A  . Number of children: N/A  . Years of education: N/A   Occupational History  . Not on file.   Social History Main Topics  . Smoking status: Former Smoker    Types: Cigarettes    Quit date: 08/06/1988  . Smokeless tobacco: Never Used  . Alcohol use No   . Drug use: No  . Sexual activity: Not on file   Other Topics Concern  . Not on file   Social History Narrative  . No narrative on file         Assessment & Plan:

## 2016-07-17 NOTE — Assessment & Plan Note (Signed)
Previous hospitalizations reviewed.  Initially she was told it was due to [facility] though reports that she had no symptoms at the time.  UA noted and did have WBCs but I suspect a poor collection and urine was contaminated.  I discussed with her daughter in law that if she is asymptomatic there is no indication to check her urine for a UTI and no indication for treatment and I doubt, if she had no urinary symptoms in December, that she had a UTI.

## 2016-07-17 NOTE — Assessment & Plan Note (Signed)
Doing well with this.  Labs today and rtc 6 months unless concerns.

## 2016-07-17 NOTE — Assessment & Plan Note (Signed)
Recent creat reviewed and is stable.  No dose adjustments needed at this time.

## 2016-07-17 NOTE — Assessment & Plan Note (Signed)
She is now on Namenda.

## 2016-07-18 LAB — T-HELPER CELL (CD4) - (RCID CLINIC ONLY)
CD4 % Helper T Cell: 27 % — ABNORMAL LOW (ref 33–55)
CD4 T Cell Abs: 310 /uL — ABNORMAL LOW (ref 400–2700)

## 2016-07-19 LAB — HIV-1 RNA QUANT-NO REFLEX-BLD
HIV 1 RNA Quant: 34 copies/mL — ABNORMAL HIGH
HIV-1 RNA Quant, Log: 1.53 Log copies/mL — ABNORMAL HIGH

## 2016-07-23 ENCOUNTER — Other Ambulatory Visit (HOSPITAL_COMMUNITY): Payer: Self-pay

## 2016-09-14 ENCOUNTER — Emergency Department (HOSPITAL_COMMUNITY): Payer: Medicare (Managed Care)

## 2016-09-14 ENCOUNTER — Encounter (HOSPITAL_COMMUNITY): Payer: Self-pay

## 2016-09-14 ENCOUNTER — Emergency Department (HOSPITAL_COMMUNITY)
Admission: EM | Admit: 2016-09-14 | Discharge: 2016-09-15 | Disposition: A | Discharge status: Home / Self Care | Payer: Medicare (Managed Care) | Attending: Emergency Medicine | Admitting: Emergency Medicine

## 2016-09-14 DIAGNOSIS — I129 Hypertensive chronic kidney disease with stage 1 through stage 4 chronic kidney disease, or unspecified chronic kidney disease: Secondary | ICD-10-CM | POA: Diagnosis not present

## 2016-09-14 DIAGNOSIS — N183 Chronic kidney disease, stage 3 (moderate): Secondary | ICD-10-CM | POA: Insufficient documentation

## 2016-09-14 DIAGNOSIS — Z79899 Other long term (current) drug therapy: Secondary | ICD-10-CM | POA: Insufficient documentation

## 2016-09-14 DIAGNOSIS — G44209 Tension-type headache, unspecified, not intractable: Secondary | ICD-10-CM | POA: Diagnosis not present

## 2016-09-14 DIAGNOSIS — Z87891 Personal history of nicotine dependence: Secondary | ICD-10-CM | POA: Diagnosis not present

## 2016-09-14 DIAGNOSIS — Z21 Asymptomatic human immunodeficiency virus [HIV] infection status: Secondary | ICD-10-CM | POA: Diagnosis not present

## 2016-09-14 DIAGNOSIS — R51 Headache: Secondary | ICD-10-CM | POA: Diagnosis present

## 2016-09-14 LAB — CBC WITH DIFFERENTIAL/PLATELET
BASOS ABS: 0 10*3/uL (ref 0.0–0.1)
Basophils Relative: 1 %
EOS PCT: 1 %
Eosinophils Absolute: 0.1 10*3/uL (ref 0.0–0.7)
HCT: 28.2 % — ABNORMAL LOW (ref 36.0–46.0)
Hemoglobin: 9.7 g/dL — ABNORMAL LOW (ref 12.0–15.0)
LYMPHS PCT: 36 %
Lymphs Abs: 1.9 10*3/uL (ref 0.7–4.0)
MCH: 35.3 pg — ABNORMAL HIGH (ref 26.0–34.0)
MCHC: 34.4 g/dL (ref 30.0–36.0)
MCV: 102.5 fL — AB (ref 78.0–100.0)
Monocytes Absolute: 0.7 10*3/uL (ref 0.1–1.0)
Monocytes Relative: 14 %
NEUTROS ABS: 2.6 10*3/uL (ref 1.7–7.7)
Neutrophils Relative %: 48 %
PLATELETS: 381 10*3/uL (ref 150–400)
RBC: 2.75 MIL/uL — AB (ref 3.87–5.11)
RDW: 14.2 % (ref 11.5–15.5)
WBC: 5.3 10*3/uL (ref 4.0–10.5)

## 2016-09-14 LAB — COMPREHENSIVE METABOLIC PANEL
ALT: 12 U/L — ABNORMAL LOW (ref 14–54)
AST: 15 U/L (ref 15–41)
Albumin: 3.8 g/dL (ref 3.5–5.0)
Alkaline Phosphatase: 57 U/L (ref 38–126)
Anion gap: 6 (ref 5–15)
BUN: 16 mg/dL (ref 6–20)
CHLORIDE: 105 mmol/L (ref 101–111)
CO2: 27 mmol/L (ref 22–32)
CREATININE: 1.03 mg/dL — AB (ref 0.44–1.00)
Calcium: 10 mg/dL (ref 8.9–10.3)
GFR calc Af Amer: 57 mL/min — ABNORMAL LOW (ref >60)
GFR, EST NON AFRICAN AMERICAN: 49 mL/min — AB (ref >60)
GLUCOSE: 100 mg/dL — AB (ref 65–99)
Potassium: 3.6 mmol/L (ref 3.5–5.1)
Sodium: 138 mmol/L (ref 135–145)
Total Bilirubin: 0.4 mg/dL (ref 0.3–1.2)
Total Protein: 7.4 g/dL (ref 6.5–8.1)

## 2016-09-14 LAB — URINALYSIS, ROUTINE W REFLEX MICROSCOPIC
Bilirubin Urine: NEGATIVE
Glucose, UA: NEGATIVE mg/dL
HGB URINE DIPSTICK: NEGATIVE
Ketones, ur: NEGATIVE mg/dL
LEUKOCYTES UA: NEGATIVE
Nitrite: NEGATIVE
PROTEIN: NEGATIVE mg/dL
Specific Gravity, Urine: 1.005 (ref 1.005–1.030)
pH: 7 (ref 5.0–8.0)

## 2016-09-14 NOTE — ED Triage Notes (Signed)
Family called EMS for SOB. Family reports pt can not walk and is more confused than normal. Pt A&O X 4. Pt C/O headache at this time. Pt recently started on antibiotic for UTI per EMS.

## 2016-09-14 NOTE — Discharge Instructions (Signed)
Return if any problems.

## 2016-09-15 ENCOUNTER — Other Ambulatory Visit: Payer: Self-pay

## 2016-09-15 NOTE — ED Notes (Signed)
Pt picked up by son. Pt and son verbalized understanding of discharge instructions.

## 2016-09-15 NOTE — ED Provider Notes (Signed)
[place] DEPT Provider Note   CSN: [id number] Arrival date & time: 09/14/16  2116     History   Chief Complaint Chief Complaint  Patient presents with  . Headache    HPI Karen Luna is a 83 y.o. female.  The history is provided by the patient. No language interpreter was used.  Headache   This is a new problem. The current episode started 12 to 24 hours ago. The problem occurs constantly. The problem has not changed since onset.The headache is associated with nothing. The pain is located in the frontal region. The pain is moderate. The pain does not radiate. Associated symptoms include nausea. She has tried nothing for the symptoms. The treatment provided no relief.  Pt complains of a headache.  Family reported pt was short of breath and had trouble walking.   Past Medical History:  Diagnosis Date  . Anemia of chronic disease 07/06/2011  . Aortic atherosclerosis ([place]) 01/06/2015   Seen on CT scan, currently asymptomatic  . Cancer ([place])    uterine( per pt)  . Chronic kidney disease, stage 3 09/01/2008  . Chronic venous insufficiency 10/03/2012  . Essential hypertension 09/19/2006  . History of cervical cancer 08/11/2012   s/p hysterectomy, remote, specifics unknown   . Human immunodeficiency virus (HIV) disease ([place]) 09/12/2006  . 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 07/25/2012  . Low grade squamous intraepithelial lesion (LGSIL) on cervical Pap smear 08/06/2007   LGSIL: 08/06/2007, 11/05/2008, 05/11/2009 ASC-US: 05/27/2008, 08/31/2009, 05/24/2010, 05/23/2012 High Risk HPV: Detected   . Onychomycosis of left great toe 09/09/2015  . Overweight (BMI 25.0-29.9) 07/25/2012    Patient Active Problem List   Diagnosis Date Noted  . Dementia 07/17/2016  . Confusion 07/17/2016  . Sinus congestion 06/27/2016  . Severe major depression, single episode, with psychotic features ([place]) 06/21/2016  . Onychomycosis of left great toe 09/09/2015  . Aortic atherosclerosis ([place])  01/06/2015  . Acquired heart block 12/29/2014  . Encounter for long-term (current) use of medications 09/16/2014  . Screening examination for venereal disease 09/16/2014  . Chronic venous insufficiency 10/03/2012  . History of cervical cancer 08/11/2012  . Overweight (BMI 25.0-29.9) 07/25/2012  . Healthcare maintenance 07/25/2012  . 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 07/25/2012  . Anemia of chronic disease 07/06/2011  . Chronic kidney disease, stage 3 09/01/2008  . Low grade squamous intraepithelial lesion (LGSIL) on cervical Pap smear 08/06/2007  . Essential hypertension 09/19/2006  . Human immunodeficiency virus (HIV) disease ([place]) 09/12/2006    Past Surgical History:  Procedure Laterality Date  . ABDOMINAL HYSTERECTOMY      OB History    No data available       Home Medications    Prior to Admission medications   Medication Sig Start Date End Date Taking? Authorizing Provider  abacavir-dolutegravir-lamiVUDine (TRIUMEQ) 600-50-300 MG tablet Take 1 tablet by mouth daily. 05/01/16  Yes [name], MD  amLODipine (NORVASC) 5 MG tablet Take 1 tablet (5 mg total) by mouth daily. 06/21/16 06/21/17 Yes Ledell Noss, MD  citalopram (CELEXA) 20 MG tablet Take 20 mg by mouth daily.    Yes [provider]  hydrALAZINE (APRESOLINE) 100 MG tablet Take 1 tablet (100 mg total) by mouth 3 (three) times daily. 09/09/15  Yes [name], MD  memantine (NAMENDA) 10 MG tablet Take 10 mg by mouth 2 (two) times daily.    Yes [provider]  valsartan-hydrochlorothiazide (DIOVAN-HCT) 320-25 MG tablet Take 1 tablet by mouth daily. 05/27/15  Yes [name], MD  vitamin B-12 (CYANOCOBALAMIN) 100 MCG tablet Take 100 mcg by mouth daily.   Yes [provider]  cholecalciferol (VITAMIN D) 1000 units tablet Take 1,000 Units by mouth daily.    [provider]  ferrous sulfate 325 (65 FE) MG tablet Take 325 mg by mouth daily with breakfast.     [provider]    Family History Family History  Problem Relation Age of Onset  . Hypertension Mother   . Heart disease Mother   . Hypertension Father   . Heart disease Father   . Hypertension Sister   . HIV/AIDS Daughter   . Early death Sister        Gun shot wound  . Cerebrovascular Accident Son     Social History Social History  Substance Use Topics  . Smoking status: Former Smoker    Types: Cigarettes    Quit date: 08/06/1988  . Smokeless tobacco: Never Used  . Alcohol use No     Allergies   Clonidine derivatives   Review of Systems Review of Systems  Gastrointestinal: Positive for nausea.  Neurological: Positive for headaches.  All other systems reviewed and are negative.    Physical Exam Updated Vital Signs BP (!) 178/71   Pulse (!) 47   Temp 98 F (36.7 C) (Oral)   Resp 13   SpO2 100%   Physical Exam  Constitutional: She appears well-developed and well-nourished. No distress.  HENT:  Head: Normocephalic and atraumatic.  Eyes: Conjunctivae are normal.  Neck: Neck supple.  Cardiovascular: Normal rate and regular rhythm.   No murmur heard. Pulmonary/Chest: Effort normal and breath sounds normal. No respiratory distress.  Abdominal: Soft. There is no tenderness.  Musculoskeletal: She exhibits no edema.  Neurological: She is alert.  Skin: Skin is warm and dry.  Psychiatric: She has a normal mood and affect.  Nursing note and vitals reviewed.    ED Treatments / Results  Labs (all labs ordered are listed, but only abnormal results are displayed) Labs Reviewed  URINALYSIS, ROUTINE W REFLEX MICROSCOPIC - Abnormal; Notable for the following:       Result Value   Color, Urine STRAW (*)    All other components within normal limits  CBC WITH DIFFERENTIAL/PLATELET - Abnormal; Notable for the following:    RBC 2.75 (*)    Hemoglobin 9.7 (*)    HCT 28.2 (*)    MCV 102.5 (*)    MCH 35.3 (*)    All other components within normal limits    COMPREHENSIVE METABOLIC PANEL - Abnormal; Notable for the following:    Glucose, Bld 100 (*)    Creatinine, Ser 1.03 (*)    ALT 12 (*)    GFR calc non Af Amer 49 (*)    GFR calc Af Amer 57 (*)    All other components within normal limits    EKG  EKG Interpretation  Date/Time:  Friday Sep 14 2016 21:23:40 EDT Ventricular Rate:  56 PR Interval:    QRS Duration: 88 QT Interval:  475 QTC Calculation: 459 R Axis:   73 Text Interpretation:  Sinus rhythm Left ventricular hypertrophy Confirmed by LIU MD, DANA [phone]) on 09/14/2016 9:36:44 PM       Radiology Dg Chest 2 View  Result Date: 09/14/2016 CLINICAL DATA:  Dyspnea and headache x2 days EXAM: CHEST  2 VIEW COMPARISON:  06/13/2016 FINDINGS: Stable cardiomegaly with tortuous calcified thoracic aorta. Mild hyperinflation of the lungs upper lobe predominant without  pneumonic consolidation, effusion or pneumothorax. Degenerative changes are seen about both shoulders and dorsal spine. IMPRESSION: No active cardiopulmonary disease. Mild hyperinflated appearance of the lungs bilaterally. Electronically Signed   By: Ashley Royalty M.D.   On: 09/14/2016 23:11   Ct Head Wo Contrast  Result Date: 09/14/2016 CLINICAL DATA:  Increased confusion and weakness EXAM: CT HEAD WITHOUT CONTRAST TECHNIQUE: Contiguous axial images were obtained from the base of the skull through the vertex without intravenous contrast. COMPARISON:  04/10/2016 and 03/24/2016 head CT FINDINGS: Brain: Normal variant cavum velum interpositum. Moderate small vessel ischemic disease of periventricular and deep white matter. Mild superficial atrophy. No hydrocephalus. No intra-axial mass nor extra-axial fluid collections. The basal cisterns and fourth ventricle are midline. The cerebellum is unremarkable. Vascular: Atherosclerotic calcifications of the left vertebral and basilar arteries as well as the cavernous sinus carotids. No hyperdense vessels. Skull: No acute fracture or  suspicious osseous lesions. Sinuses/Orbits: Chronic opacification of the visualized right maxillary sinus with slight bulging of medial wall. A mucocele of the right maxillary sinus might have this appearance. This is a chronic finding however. No acute sinus disease. Mastoids are clear. Orbits are unremarkable Other: None IMPRESSION: No acute intracranial abnormality. Chronic small vessel ischemic disease of white matter. Electronically Signed   By: Ashley Royalty M.D.   On: 09/14/2016 23:16    Procedures Procedures (including critical care time)  Medications Ordered in ED Medications - No data to display   Initial Impression / Assessment and Plan / ED Course  I have reviewed the triage vital signs and the nursing notes.  Pertinent labs & imaging results that were available during my care of the patient were reviewed by me and considered in my medical decision making (see chart for details).     Notes reviewed.  bradycardia seems to be baseline.  No uti.  Pt reports headache resolved.   No treatment given   Final Clinical Impressions(s) / ED Diagnoses   Final diagnoses:  Tension-type headache, not intractable, unspecified chronicity pattern    New Prescriptions New Prescriptions   No medications on file  An After Visit Summary was printed and given to the patient.    Fransico Meadow, PA-C 09/15/16 0016    Forde Dandy, MD 09/15/16 [phone]

## 2016-09-15 NOTE — ED Notes (Signed)
Pt ambulatory around nurses station. NAD. Denies SOB, lightheadedness, and dizziness.

## 2016-09-18 ENCOUNTER — Other Ambulatory Visit: Payer: Self-pay | Admitting: Internal Medicine

## 2016-09-18 DIAGNOSIS — R51 Headache: Principal | ICD-10-CM

## 2016-09-18 DIAGNOSIS — R519 Headache, unspecified: Secondary | ICD-10-CM

## 2016-09-25 ENCOUNTER — Ambulatory Visit
Admission: RE | Admit: 2016-09-25 | Discharge: 2016-09-25 | Disposition: A | Discharge status: Home / Self Care | Payer: Medicare (Managed Care) | Source: Ambulatory Visit | Attending: Internal Medicine | Admitting: Internal Medicine

## 2016-09-25 DIAGNOSIS — R51 Headache: Principal | ICD-10-CM

## 2016-09-25 DIAGNOSIS — R519 Headache, unspecified: Secondary | ICD-10-CM

## 2017-01-01 ENCOUNTER — Ambulatory Visit: Payer: Medicare (Managed Care) | Admitting: Internal Medicine

## 2017-01-08 ENCOUNTER — Encounter: Payer: Self-pay | Admitting: Internal Medicine

## 2017-01-08 ENCOUNTER — Ambulatory Visit (INDEPENDENT_AMBULATORY_CARE_PROVIDER_SITE_OTHER): Payer: Medicare (Managed Care) | Admitting: Internal Medicine

## 2017-01-08 VITALS — BP 158/64 | HR 82 | Temp 97.2°F | Wt 140.0 lb

## 2017-01-08 DIAGNOSIS — Z113 Encounter for screening for infections with a predominantly sexual mode of transmission: Secondary | ICD-10-CM | POA: Diagnosis not present

## 2017-01-08 DIAGNOSIS — B20 Human immunodeficiency virus [HIV] disease: Secondary | ICD-10-CM

## 2017-01-08 DIAGNOSIS — N183 Chronic kidney disease, stage 3 unspecified: Secondary | ICD-10-CM

## 2017-01-08 DIAGNOSIS — Z23 Encounter for immunization: Secondary | ICD-10-CM

## 2017-01-08 DIAGNOSIS — Z7185 Encounter for immunization safety counseling: Secondary | ICD-10-CM

## 2017-01-08 DIAGNOSIS — Z7189 Other specified counseling: Secondary | ICD-10-CM

## 2017-01-08 DIAGNOSIS — Z79899 Other long term (current) drug therapy: Secondary | ICD-10-CM

## 2017-01-08 NOTE — Assessment & Plan Note (Signed)
Will screen today 

## 2017-01-08 NOTE — Progress Notes (Signed)
Patient ID: Karen Luna, female   DOB: 1931/11/07, 83 y.o.   MRN: [id number]  Subjective:    Patient ID: Karen Luna, female    DOB: 1931-12-06, 83 y.o.   MRN: [id number]  HPI She comes in for routine follow up of HIV.  On Triumeq and doing well.  No missed doses.  CD4 310, viral load just 34 copies last visit.  No new issues except some nasal congestion. Creat has remained stable, last at 1.03   Was in the ED for a headache in May.  No significant weight loss.  No associate n/v/d.      Review of Systems  Constitutional: Negative for appetite change and fatigue.  Gastrointestinal: Negative for diarrhea.  Skin: Negative for rash.       Objective:   Physical Exam  Constitutional: She appears well-developed and well-nourished. No distress.  HENT:  Mouth/Throat: Oropharynx is clear and moist. No oropharyngeal exudate.  Cardiovascular: Normal rate and regular rhythm.   Murmur heard. 2-3/6 SEM  Pulmonary/Chest: Effort normal and breath sounds normal. No respiratory distress.  Lymphadenopathy:    She has no cervical adenopathy.   Social History   Social History  . Marital status: Widowed    Spouse name: N/A  . Number of children: N/A  . Years of education: N/A   Occupational History  . Not on file.   Social History Main Topics  . Smoking status: Former Smoker    Types: Cigarettes    Quit date: 08/06/1988  . Smokeless tobacco: Never Used  . Alcohol use No  . Drug use: No  . Sexual activity: Not on file   Other Topics Concern  . Not on file   Social History Narrative  . No narrative on file   Goes to a day program; son involved in her care      Assessment & Plan:

## 2017-01-08 NOTE — Assessment & Plan Note (Signed)
Seems to be doing well.  Labs today and rtc 6 months unless concerns.

## 2017-01-08 NOTE — Assessment & Plan Note (Signed)
Will check her creat today, on Triumeq.

## 2017-01-08 NOTE — Assessment & Plan Note (Signed)
I will check her lipids today, otherwise managed by her PCP

## 2017-01-08 NOTE — Assessment & Plan Note (Signed)
Flu shot given

## 2017-01-09 LAB — CBC WITH DIFFERENTIAL/PLATELET
BASOS PCT: 0.7 %
Basophils Absolute: 39 cells/uL (ref 0–200)
EOS ABS: 90 {cells}/uL (ref 15–500)
Eosinophils Relative: 1.6 %
HCT: 27.4 % — ABNORMAL LOW (ref 35.0–45.0)
HEMOGLOBIN: 9.3 g/dL — AB (ref 11.7–15.5)
Lymphs Abs: 1562 cells/uL (ref 850–3900)
MCH: 35.2 pg — AB (ref 27.0–33.0)
MCHC: 33.9 g/dL (ref 32.0–36.0)
MCV: 103.8 fL — AB (ref 80.0–100.0)
MPV: 9.3 fL (ref 7.5–12.5)
Monocytes Relative: 12.1 %
NEUTROS ABS: 3231 {cells}/uL (ref 1500–7800)
Neutrophils Relative %: 57.7 %
PLATELETS: 422 10*3/uL — AB (ref 140–400)
RBC: 2.64 10*6/uL — AB (ref 3.80–5.10)
RDW: 12.5 % (ref 11.0–15.0)
Total Lymphocyte: 27.9 %
WBC mixed population: 678 cells/uL (ref 200–950)
WBC: 5.6 10*3/uL (ref 3.8–10.8)

## 2017-01-09 LAB — COMPLETE METABOLIC PANEL WITH GFR
AG Ratio: 1.1 (calc) (ref 1.0–2.5)
ALBUMIN MSPROF: 4 g/dL (ref 3.6–5.1)
ALKALINE PHOSPHATASE (APISO): 71 U/L (ref 33–130)
ALT: 9 U/L (ref 6–29)
AST: 13 U/L (ref 10–35)
BUN/Creatinine Ratio: 20 (calc) (ref 6–22)
BUN: 26 mg/dL — AB (ref 7–25)
CALCIUM: 10.3 mg/dL (ref 8.6–10.4)
CO2: 24 mmol/L (ref 20–32)
CREATININE: 1.33 mg/dL — AB (ref 0.60–0.88)
Chloride: 104 mmol/L (ref 98–110)
GFR, Est African American: 43 mL/min/{1.73_m2} — ABNORMAL LOW (ref >=60)
GFR, Est Non African American: 37 mL/min/{1.73_m2} — ABNORMAL LOW (ref >=60)
Globulin: 3.6 g/dL (calc) (ref 1.9–3.7)
Glucose, Bld: 111 mg/dL — ABNORMAL HIGH (ref 65–99)
Potassium: 4 mmol/L (ref 3.5–5.3)
Sodium: 139 mmol/L (ref 135–146)
Total Bilirubin: 0.3 mg/dL (ref 0.2–1.2)
Total Protein: 7.6 g/dL (ref 6.1–8.1)

## 2017-01-09 LAB — T-HELPER CELL (CD4) - (RCID CLINIC ONLY)
CD4 % Helper T Cell: 22 % — ABNORMAL LOW (ref 33–55)
CD4 T CELL ABS: 370 /uL — AB (ref 400–2700)

## 2017-01-09 LAB — RPR: RPR: NONREACTIVE

## 2017-01-09 LAB — LIPID PANEL
CHOL/HDL RATIO: 2.3 (calc) (ref <5.0)
CHOLESTEROL: 175 mg/dL (ref <200)
HDL: 77 mg/dL (ref >50)
LDL CHOLESTEROL (CALC): 83 mg/dL
Non-HDL Cholesterol (Calc): 98 mg/dL (calc) (ref <130)
Triglycerides: 71 mg/dL (ref <150)

## 2017-01-10 LAB — HIV-1 RNA QUANT-NO REFLEX-BLD
HIV 1 RNA Quant: <20 copies/mL — AB
HIV-1 RNA QUANT, LOG: DETECTED {Log_copies}/mL — AB

## 2017-06-06 IMAGING — CT CT HEAD W/O CM
3 series · 15 of 47 positions shown, 18 images · non-contrast
Comparison: 04/10/2016 and 03/24/2016 head CT

CLINICAL DATA: Increased confusion and weakness

EXAM:
CT HEAD WITHOUT CONTRAST
TECHNIQUE: Contiguous axial images were obtained from the base of the skull
through the vertex without intravenous contrast.

[Series 2: head wo · axial · 0.41mm/px · z∈[+2,+127]mm · 9 of 30 slices shown, 12 images]
[im 3/30  brain]
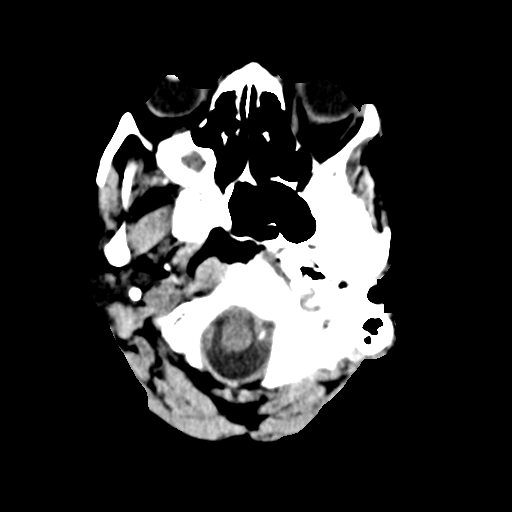
[im 3/30  bone]
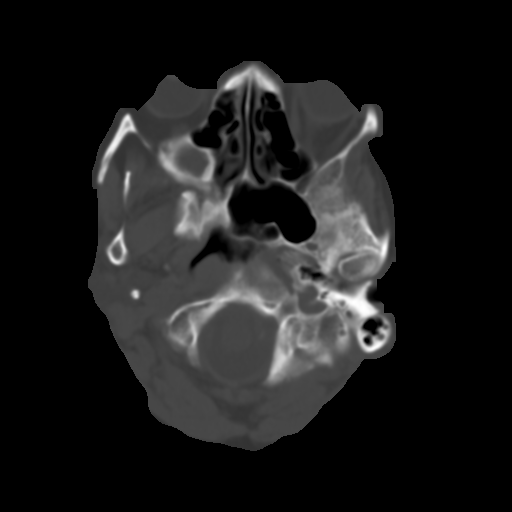
[im 6/30  brain]
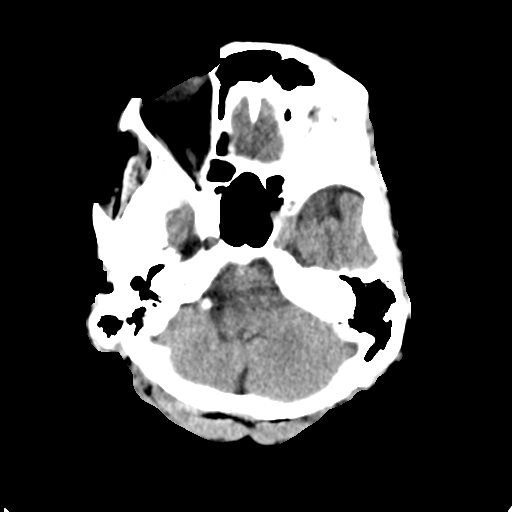
[im 9/30  brain]
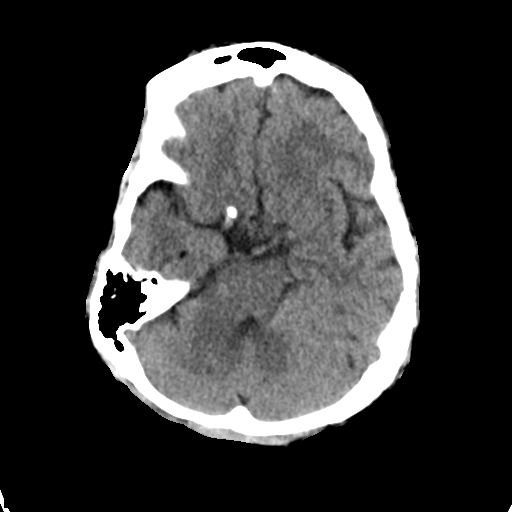
[im 12/30  brain]
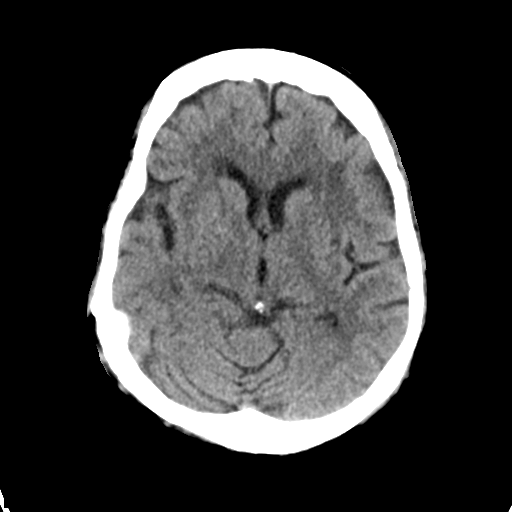
[im 16/30  brain]
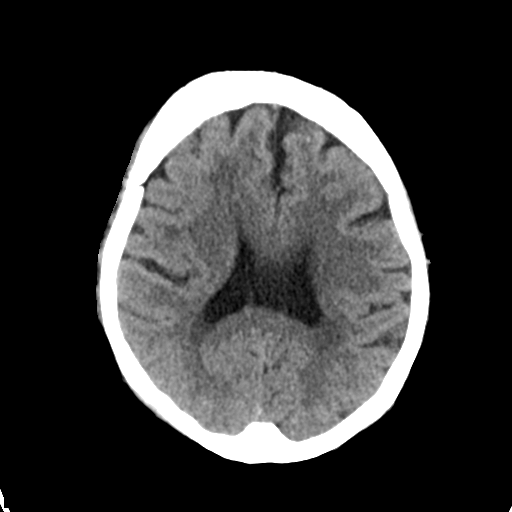
[im 16/30  bone]
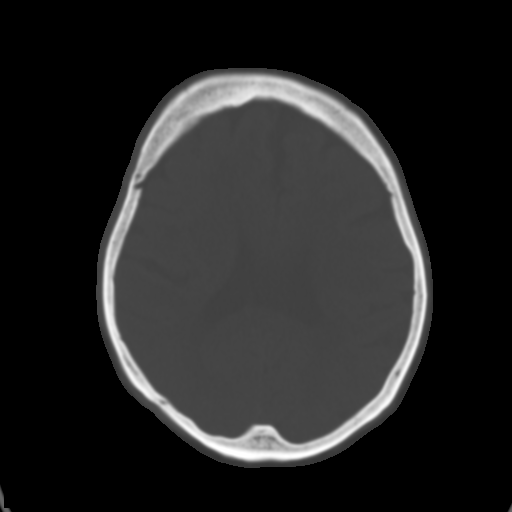
[im 19/30  brain]
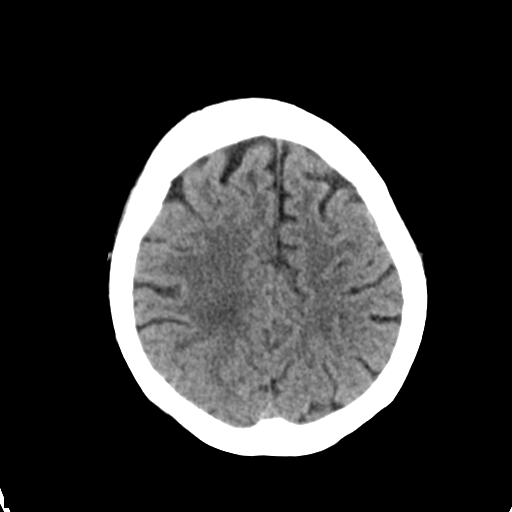
[im 22/30  brain]
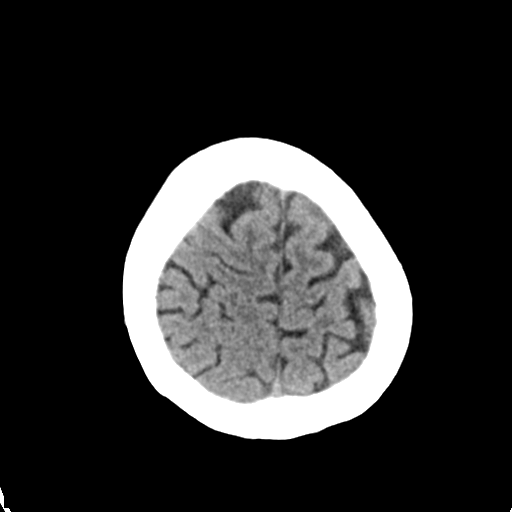
[im 25/30  brain]
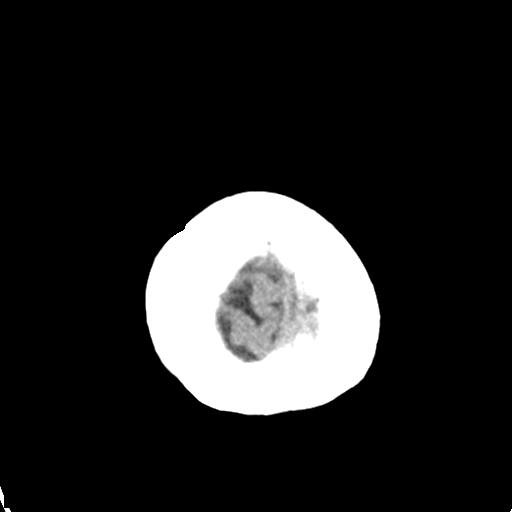
[im 28/30  brain]
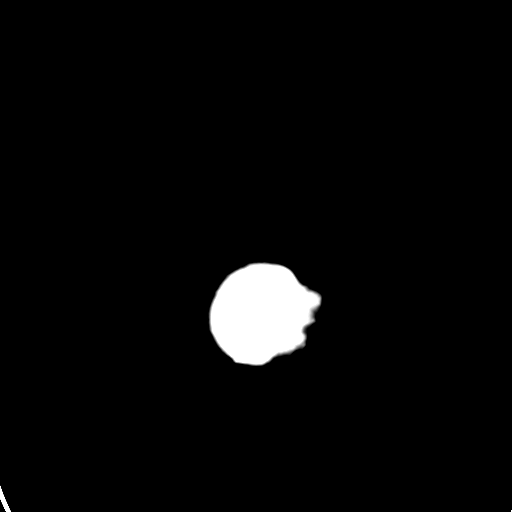
[im 28/30  bone]
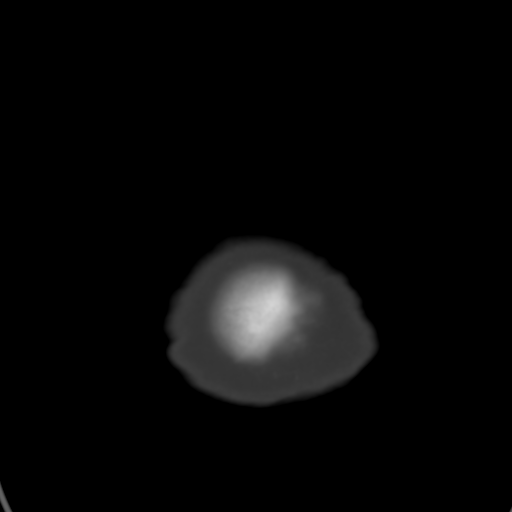

[Series 4: coronal soft tissue · coronal · 0.30mm/px · 3 of 67 slices shown]
[im 23/67  brain]
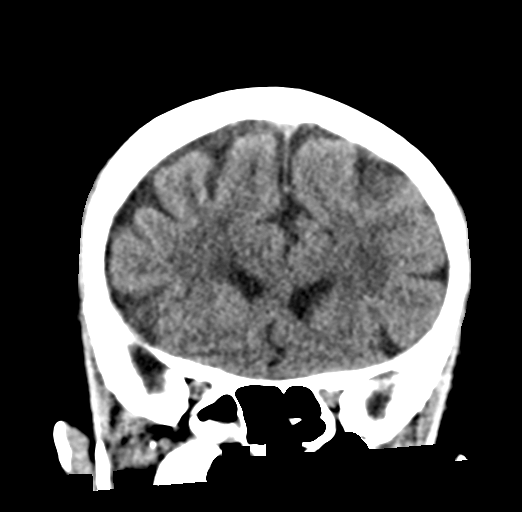
[im 30/67  brain]
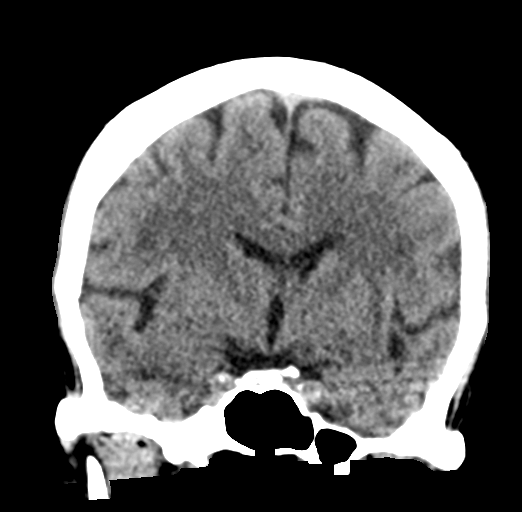
[im 37/67  brain]
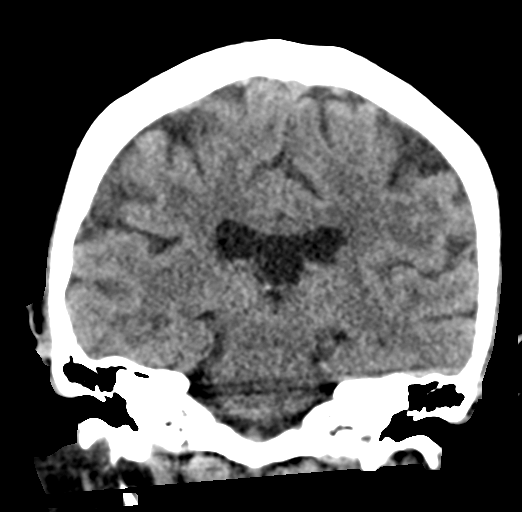

[Series 5: sagittal soft tissue · sagittal · 0.31mm/px · 3 of 53 slices shown]
[im 18/53  brain]
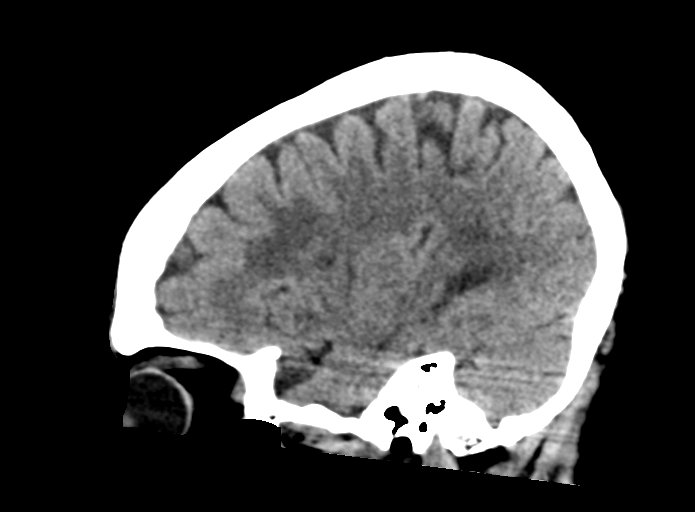
[im 27/53  brain]
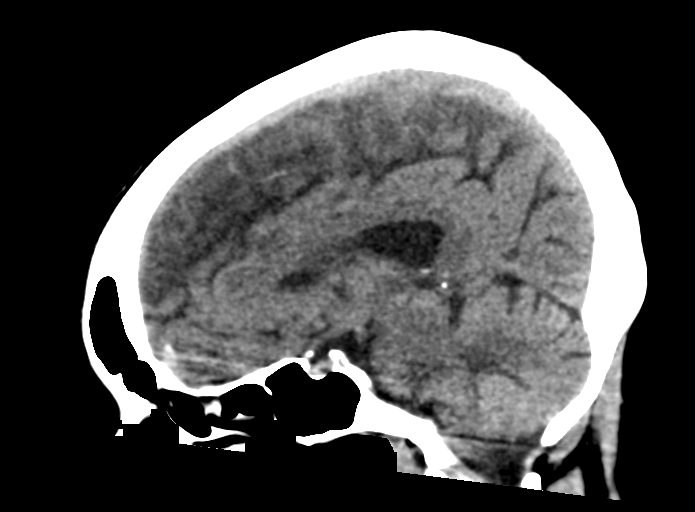
[im 35/53  brain]
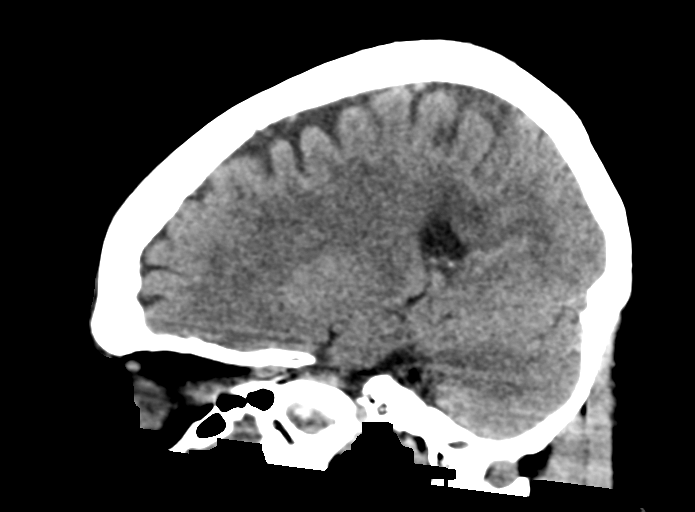

[15 of 47 positions shown; findings below may reference images not displayed]

FINDINGS: Brain: Normal variant cavum velum interpositum. Moderate small
vessel ischemic disease of periventricular and deep white matter.
Mild superficial atrophy. No hydrocephalus. No intra-axial mass nor
extra-axial fluid collections. The basal cisterns and fourth
ventricle are midline. The cerebellum is unremarkable.

Vascular: Atherosclerotic calcifications of the left vertebral and
basilar arteries as well as the cavernous sinus carotids. No
hyperdense vessels.

Skull: No acute fracture or suspicious osseous lesions.

Sinuses/Orbits: Chronic opacification of the visualized right
maxillary sinus with slight bulging of medial wall. A mucocele of
the right maxillary sinus might have this appearance. This is a
chronic finding however. No acute sinus disease. Mastoids are clear.
Orbits are unremarkable

Other: None
IMPRESSION: No acute intracranial abnormality. Chronic small vessel ischemic
disease of white matter.

## 2017-07-18 ENCOUNTER — Ambulatory Visit (INDEPENDENT_AMBULATORY_CARE_PROVIDER_SITE_OTHER): Payer: Medicare (Managed Care) | Admitting: Internal Medicine

## 2017-07-18 ENCOUNTER — Encounter: Payer: Self-pay | Admitting: Internal Medicine

## 2017-07-18 VITALS — BP 136/61 | HR 66 | Temp 97.6°F | Ht 70.0 in | Wt 144.0 lb

## 2017-07-18 DIAGNOSIS — R42 Dizziness and giddiness: Secondary | ICD-10-CM | POA: Diagnosis not present

## 2017-07-18 DIAGNOSIS — B20 Human immunodeficiency virus [HIV] disease: Secondary | ICD-10-CM

## 2017-07-18 NOTE — Progress Notes (Signed)
Patient ID: Alan Mulder, female   DOB: June 18, 1931, 84 y.o.   MRN: [id number]  Subjective:    Patient ID: Alan Mulder, female    DOB: 21-Jul-1931, 84 y.o.   MRN: [id number]  HPI She comes in for routine follow up of HIV.  On Triumeq and doing well.  No missed doses.  CD4 370, viral load <20 last visit. Creat has remained stable, last at 1.33.  No significant weight loss.  No associate n/v/d.   Some occasional dizziness, does not complain of orthostasis.     Review of Systems  Constitutional: Negative for appetite change and fatigue.  Gastrointestinal: Negative for diarrhea.  Skin: Negative for rash.  Neurological: Positive for dizziness.       Objective:   Physical Exam  Constitutional: She appears well-developed and well-nourished. No distress.  HENT:  Mouth/Throat: Oropharynx is clear and moist. No oropharyngeal exudate.  Cardiovascular:  Murmur heard. Bradycardic, 2-3/6 SEM  Pulmonary/Chest: Effort normal and breath sounds normal. No respiratory distress.  Lymphadenopathy:    She has no cervical adenopathy.  Psychiatric:  Does not answer much    SH: Goes to a day program/[facility]; son involved in her care and here today      Assessment & Plan:

## 2017-07-18 NOTE — Assessment & Plan Note (Signed)
She does not endorse orthostasis but son reports she does not drink much water.  She also is bradycardic.  He is going to discuss this with her caretakers at [facility].

## 2017-07-18 NOTE — Assessment & Plan Note (Signed)
Doing well, labs today and rtc 6 months unless concerns.

## 2017-07-19 LAB — T-HELPER CELL (CD4) - (RCID CLINIC ONLY)
CD4 % Helper T Cell: 21 % — ABNORMAL LOW (ref 33–55)
CD4 T Cell Abs: 420 /uL (ref 400–2700)

## 2017-07-20 LAB — HIV-1 RNA QUANT-NO REFLEX-BLD
HIV 1 RNA Quant: <20 copies/mL
HIV-1 RNA Quant, Log: <1.3 Log copies/mL

## 2018-01-20 ENCOUNTER — Ambulatory Visit (INDEPENDENT_AMBULATORY_CARE_PROVIDER_SITE_OTHER): Payer: Medicare (Managed Care) | Admitting: Internal Medicine

## 2018-01-20 ENCOUNTER — Encounter: Payer: Self-pay | Admitting: Internal Medicine

## 2018-01-20 VITALS — Wt 140.0 lb

## 2018-01-20 DIAGNOSIS — N183 Chronic kidney disease, stage 3 unspecified: Secondary | ICD-10-CM

## 2018-01-20 DIAGNOSIS — B20 Human immunodeficiency virus [HIV] disease: Secondary | ICD-10-CM

## 2018-01-20 DIAGNOSIS — Z23 Encounter for immunization: Secondary | ICD-10-CM | POA: Diagnosis not present

## 2018-01-20 DIAGNOSIS — Z113 Encounter for screening for infections with a predominantly sexual mode of transmission: Secondary | ICD-10-CM | POA: Diagnosis not present

## 2018-01-20 DIAGNOSIS — Z79899 Other long term (current) drug therapy: Secondary | ICD-10-CM

## 2018-01-20 DIAGNOSIS — Z7189 Other specified counseling: Secondary | ICD-10-CM

## 2018-01-20 DIAGNOSIS — Z7185 Encounter for immunization safety counseling: Secondary | ICD-10-CM

## 2018-01-20 NOTE — Progress Notes (Signed)
Patient ID: Karen Luna, female   DOB: 08-05-1931, 84 y.o.   MRN: [id number]  Subjective:    Patient ID: Karen Luna, female    DOB: 08/08/1931, 84 y.o.   MRN: [id number]  HPI She comes in for routine follow up of HIV.  On Triumeq and doing well.  No missed doses.  CD4 420, viral load <20 last visit. Creat has remained stable, last at 1.33.  No significant weight loss.  No associate n/v/d.     Review of Systems  Constitutional: Negative for appetite change and fatigue.  Gastrointestinal: Negative for diarrhea.  Skin: Negative for rash.  Neurological: Negative for dizziness.       Objective:   Physical Exam  Constitutional: She appears well-developed and well-nourished. No distress.  HENT:  Mouth/Throat: Oropharynx is clear and moist. No oropharyngeal exudate.  Cardiovascular:  Murmur heard. Bradycardic, 2-3/6 SEM  Pulmonary/Chest: Effort normal and breath sounds normal. No respiratory distress.  Lymphadenopathy:    She has no cervical adenopathy.  Psychiatric:  Does not answer much    SH: Continues to go to a day program/[facility]; here with her daughter in law      Assessment & Plan:

## 2018-01-20 NOTE — Assessment & Plan Note (Signed)
No reported sexual activity.  Will screen today

## 2018-01-20 NOTE — Assessment & Plan Note (Signed)
Doing well on Triumeq.  Gets her medication provided at [facility] so no missed doses.  Fells well.  Can rtc in 1 year.

## 2018-01-20 NOTE — Assessment & Plan Note (Signed)
Will recheck her creat today.  No dose adjustments indicated to date.

## 2018-01-20 NOTE — Assessment & Plan Note (Signed)
Discussed flu shot and given today Completed two Pneumovax and one Prevnar

## 2018-01-21 LAB — T-HELPER CELL (CD4) - (RCID CLINIC ONLY)
CD4 T CELL ABS: 340 /uL — AB (ref 400–2700)
CD4 T CELL HELPER: 25 % — AB (ref 33–55)

## 2018-01-22 LAB — COMPLETE METABOLIC PANEL WITH GFR
AG RATIO: 1.2 (calc) (ref 1.0–2.5)
ALT: 8 U/L (ref 6–29)
AST: 15 U/L (ref 10–35)
Albumin: 4.4 g/dL (ref 3.6–5.1)
Alkaline phosphatase (APISO): 73 U/L (ref 33–130)
BILIRUBIN TOTAL: 0.4 mg/dL (ref 0.2–1.2)
BUN/Creatinine Ratio: 18 (calc) (ref 6–22)
BUN: 31 mg/dL — ABNORMAL HIGH (ref 7–25)
CALCIUM: 10.3 mg/dL (ref 8.6–10.4)
CHLORIDE: 107 mmol/L (ref 98–110)
CO2: 21 mmol/L (ref 20–32)
Creat: 1.68 mg/dL — ABNORMAL HIGH (ref 0.60–0.88)
GFR, EST AFRICAN AMERICAN: 32 mL/min/{1.73_m2} — AB (ref >=60)
GFR, Est Non African American: 28 mL/min/{1.73_m2} — ABNORMAL LOW (ref >=60)
Globulin: 3.6 g/dL (calc) (ref 1.9–3.7)
Glucose, Bld: 95 mg/dL (ref 65–99)
POTASSIUM: 4.4 mmol/L (ref 3.5–5.3)
SODIUM: 139 mmol/L (ref 135–146)
TOTAL PROTEIN: 8 g/dL (ref 6.1–8.1)

## 2018-01-22 LAB — CBC WITH DIFFERENTIAL/PLATELET
BASOS ABS: 41 {cells}/uL (ref 0–200)
BASOS PCT: 0.9 %
EOS ABS: 59 {cells}/uL (ref 15–500)
Eosinophils Relative: 1.3 %
HCT: 27.8 % — ABNORMAL LOW (ref 35.0–45.0)
Hemoglobin: 9.5 g/dL — ABNORMAL LOW (ref 11.7–15.5)
LYMPHS ABS: 1467 {cells}/uL (ref 850–3900)
MCH: 35.3 pg — AB (ref 27.0–33.0)
MCHC: 34.2 g/dL (ref 32.0–36.0)
MCV: 103.3 fL — AB (ref 80.0–100.0)
MPV: 9.7 fL (ref 7.5–12.5)
Monocytes Relative: 11 %
NEUTROS ABS: 2439 {cells}/uL (ref 1500–7800)
Neutrophils Relative %: 54.2 %
Platelets: 408 10*3/uL — ABNORMAL HIGH (ref 140–400)
RBC: 2.69 10*6/uL — ABNORMAL LOW (ref 3.80–5.10)
RDW: 13.3 % (ref 11.0–15.0)
Total Lymphocyte: 32.6 %
WBC mixed population: 495 cells/uL (ref 200–950)
WBC: 4.5 10*3/uL (ref 3.8–10.8)

## 2018-01-22 LAB — LIPID PANEL
Cholesterol: 181 mg/dL (ref <200)
HDL: 67 mg/dL (ref >50)
LDL Cholesterol (Calc): 96 mg/dL (calc)
Non-HDL Cholesterol (Calc): 114 mg/dL (calc) (ref <130)
TRIGLYCERIDES: 90 mg/dL (ref <150)
Total CHOL/HDL Ratio: 2.7 (calc) (ref <5.0)

## 2018-01-22 LAB — HIV-1 RNA QUANT-NO REFLEX-BLD
HIV 1 RNA Quant: <20 copies/mL
HIV-1 RNA Quant, Log: <1.3 Log copies/mL

## 2018-01-22 LAB — RPR: RPR: NONREACTIVE

## 2019-01-21 ENCOUNTER — Ambulatory Visit: Payer: Medicare (Managed Care) | Admitting: Internal Medicine

## 2019-02-17 ENCOUNTER — Ambulatory Visit: Payer: Medicare (Managed Care) | Admitting: Internal Medicine

## 2019-02-18 ENCOUNTER — Ambulatory Visit: Payer: Medicare (Managed Care) | Admitting: Internal Medicine

## 2019-02-19 ENCOUNTER — Ambulatory Visit: Payer: Medicare (Managed Care) | Admitting: Internal Medicine

## 2019-03-16 ENCOUNTER — Encounter: Payer: Self-pay | Admitting: Internal Medicine

## 2019-03-16 ENCOUNTER — Other Ambulatory Visit: Payer: Self-pay

## 2019-03-16 ENCOUNTER — Ambulatory Visit (INDEPENDENT_AMBULATORY_CARE_PROVIDER_SITE_OTHER): Payer: Medicare (Managed Care) | Admitting: Internal Medicine

## 2019-03-16 ENCOUNTER — Other Ambulatory Visit (HOSPITAL_COMMUNITY)
Admission: RE | Admit: 2019-03-16 | Discharge: 2019-03-16 | Disposition: A | Discharge status: Home / Self Care | Payer: Medicare (Managed Care) | Source: Ambulatory Visit | Attending: Internal Medicine | Admitting: Internal Medicine

## 2019-03-16 VITALS — BP 135/85 | HR 81 | Temp 97.8°F | Ht 68.0 in | Wt 130.0 lb

## 2019-03-16 DIAGNOSIS — Z113 Encounter for screening for infections with a predominantly sexual mode of transmission: Secondary | ICD-10-CM | POA: Insufficient documentation

## 2019-03-16 DIAGNOSIS — Z79899 Other long term (current) drug therapy: Secondary | ICD-10-CM | POA: Diagnosis not present

## 2019-03-16 DIAGNOSIS — N1831 Chronic kidney disease, stage 3a: Secondary | ICD-10-CM

## 2019-03-16 DIAGNOSIS — B20 Human immunodeficiency virus [HIV] disease: Secondary | ICD-10-CM | POA: Insufficient documentation

## 2019-03-16 NOTE — Progress Notes (Signed)
Patient ID: Karen Luna, female   DOB: [birth year], 85 y.o.   MRN: [id number]  Subjective:    Patient ID: Karen Luna, female    DOB: 1931-10-19, 85 y.o.   MRN: [id number]  HPI Here for follow up of HIV She continues on Triumeq and denies any missed doses.  She is seen yearly and last CD4 340, vrial load < 20.  No new issues.  [facility] currently closed and patient is staying home with COVID.  No associated n/v.  No weight loss.  Had her flu shot by [facility].     Review of Systems  Constitutional: Negative for appetite change and fatigue.  Gastrointestinal: Negative for diarrhea.  Skin: Negative for rash.  Neurological: Negative for dizziness.       Objective:   Physical Exam  Constitutional: She appears well-developed and well-nourished. No distress.  HENT:  Mouth/Throat: Oropharynx is clear and moist. No oropharyngeal exudate.  Cardiovascular: Normal rate and regular rhythm.  Murmur heard.  3-4/6 SEM  Pulmonary/Chest: Effort normal and breath sounds normal. No respiratory distress.  Musculoskeletal:        General: No edema.  Lymphadenopathy:    She has no cervical adenopathy.  Psychiatric:  Does not answer much    SH: no tobacco      Assessment & Plan:

## 2019-03-16 NOTE — Assessment & Plan Note (Signed)
Doing well on her medication and no issues.  No changes needed.  rtc 1 year

## 2019-03-16 NOTE — Assessment & Plan Note (Signed)
Lipid panel today

## 2019-03-16 NOTE — Assessment & Plan Note (Signed)
Will screen today 

## 2019-03-16 NOTE — Assessment & Plan Note (Signed)
This has been stable.  Will recheck today

## 2019-03-17 LAB — URINE CYTOLOGY ANCILLARY ONLY
Chlamydia: NEGATIVE
Comment: NEGATIVE
Comment: NORMAL
Neisseria Gonorrhea: NEGATIVE

## 2019-03-17 LAB — T-HELPER CELL (CD4) - (RCID CLINIC ONLY)
CD4 % Helper T Cell: 28 % — ABNORMAL LOW (ref 33–65)
CD4 T Cell Abs: 263 /uL — ABNORMAL LOW (ref 400–1790)

## 2019-03-27 LAB — CBC WITH DIFFERENTIAL/PLATELET
Absolute Monocytes: 445 cells/uL (ref 200–950)
Basophils Absolute: 21 cells/uL (ref 0–200)
Basophils Relative: 0.5 %
Eosinophils Absolute: 21 cells/uL (ref 15–500)
Eosinophils Relative: 0.5 %
HCT: 27.2 % — ABNORMAL LOW (ref 35.0–45.0)
Hemoglobin: 9.2 g/dL — ABNORMAL LOW (ref 11.7–15.5)
Lymphs Abs: 911 cells/uL (ref 850–3900)
MCH: 36.7 pg — ABNORMAL HIGH (ref 27.0–33.0)
MCHC: 33.8 g/dL (ref 32.0–36.0)
MCV: 108.4 fL — ABNORMAL HIGH (ref 80.0–100.0)
MPV: 9.7 fL (ref 7.5–12.5)
Monocytes Relative: 10.6 %
Neutro Abs: 2801 cells/uL (ref 1500–7800)
Neutrophils Relative %: 66.7 %
Platelets: 379 10*3/uL (ref 140–400)
RBC: 2.51 10*6/uL — ABNORMAL LOW (ref 3.80–5.10)
RDW: 13.2 % (ref 11.0–15.0)
Total Lymphocyte: 21.7 %
WBC: 4.2 10*3/uL (ref 3.8–10.8)

## 2019-03-27 LAB — LIPID PANEL
Cholesterol: 196 mg/dL (ref <200)
HDL: 72 mg/dL (ref >=50)
LDL Cholesterol (Calc): 105 mg/dL (calc) — ABNORMAL HIGH
Non-HDL Cholesterol (Calc): 124 mg/dL (calc) (ref <130)
Total CHOL/HDL Ratio: 2.7 (calc) (ref <5.0)
Triglycerides: 91 mg/dL (ref <150)

## 2019-03-27 LAB — COMPLETE METABOLIC PANEL WITH GFR
AG Ratio: 1.4 (calc) (ref 1.0–2.5)
ALT: 6 U/L (ref 6–29)
AST: 12 U/L (ref 10–35)
Albumin: 4.4 g/dL (ref 3.6–5.1)
Alkaline phosphatase (APISO): 45 U/L (ref 37–153)
BUN/Creatinine Ratio: 16 (calc) (ref 6–22)
BUN: 29 mg/dL — ABNORMAL HIGH (ref 7–25)
CO2: 26 mmol/L (ref 20–32)
Calcium: 10.6 mg/dL — ABNORMAL HIGH (ref 8.6–10.4)
Chloride: 104 mmol/L (ref 98–110)
Creat: 1.79 mg/dL — ABNORMAL HIGH (ref 0.60–0.88)
GFR, Est African American: 29 mL/min/{1.73_m2} — ABNORMAL LOW (ref >=60)
GFR, Est Non African American: 25 mL/min/{1.73_m2} — ABNORMAL LOW (ref >=60)
Globulin: 3.2 g/dL (calc) (ref 1.9–3.7)
Glucose, Bld: 102 mg/dL — ABNORMAL HIGH (ref 65–99)
Potassium: 4.4 mmol/L (ref 3.5–5.3)
Sodium: 139 mmol/L (ref 135–146)
Total Bilirubin: 0.4 mg/dL (ref 0.2–1.2)
Total Protein: 7.6 g/dL (ref 6.1–8.1)

## 2019-03-27 LAB — HIV-1 RNA QUANT-NO REFLEX-BLD
HIV 1 RNA Quant: <20 copies/mL
HIV-1 RNA Quant, Log: <1.3 Log copies/mL

## 2019-03-27 LAB — RPR: RPR Ser Ql: NONREACTIVE

## 2019-12-23 ENCOUNTER — Other Ambulatory Visit: Payer: Self-pay | Admitting: Internal Medicine

## 2019-12-23 ENCOUNTER — Telehealth: Payer: Self-pay

## 2019-12-23 MED ORDER — BICTEGRAVIR-EMTRICITAB-TENOFOV 50-200-25 MG PO TABS: 1.0000 | ORAL_TABLET | Freq: Every day | ORAL | 11 refills | Status: DC

## 2019-12-23 NOTE — Telephone Encounter (Signed)
Called patient to update on medication change. Spoke her son who states he is her power of attorney. Updated her son about medication change. Does not have any questions at this time. [place]

## 2020-06-08 ENCOUNTER — Encounter: Payer: Self-pay | Admitting: Internal Medicine

## 2020-06-08 ENCOUNTER — Other Ambulatory Visit: Payer: Self-pay

## 2020-06-08 ENCOUNTER — Ambulatory Visit (INDEPENDENT_AMBULATORY_CARE_PROVIDER_SITE_OTHER): Payer: Medicare (Managed Care) | Admitting: Internal Medicine

## 2020-06-08 VITALS — BP 162/71 | HR 71 | Temp 97.7°F | Resp 16 | Ht 68.0 in | Wt 125.2 lb

## 2020-06-08 DIAGNOSIS — Z7185 Encounter for immunization safety counseling: Secondary | ICD-10-CM

## 2020-06-08 DIAGNOSIS — Z113 Encounter for screening for infections with a predominantly sexual mode of transmission: Secondary | ICD-10-CM | POA: Diagnosis not present

## 2020-06-08 DIAGNOSIS — Z79899 Other long term (current) drug therapy: Secondary | ICD-10-CM | POA: Diagnosis not present

## 2020-06-08 DIAGNOSIS — N1831 Chronic kidney disease, stage 3a: Secondary | ICD-10-CM

## 2020-06-08 DIAGNOSIS — B20 Human immunodeficiency virus [HIV] disease: Secondary | ICD-10-CM

## 2020-06-08 DIAGNOSIS — Z23 Encounter for immunization: Secondary | ICD-10-CM | POA: Diagnosis not present

## 2020-06-08 LAB — CBC WITH DIFFERENTIAL/PLATELET
Basophils Absolute: 42 cells/uL (ref 0–200)
Eosinophils Relative: 1.4 %

## 2020-06-08 NOTE — Assessment & Plan Note (Signed)
Will screen 

## 2020-06-08 NOTE — Progress Notes (Signed)
   Subjective:    Patient ID: Karen Luna, female    DOB: 1932-01-27, 87 y.o.   MRN: [id number]  HPI She is here for follow up of HIV She is here for her yearly follow up and no concerns.  She was changed to [place] earlier this year and having no issues.  No complaints today.  No labs prior to the visit. She was dropped off by her son.  No new medical issues.  [facility].    Review of Systems  Constitutional: Negative for fatigue.  Gastrointestinal: Negative for diarrhea and nausea.  Skin: Negative for rash.       Objective:   Physical Exam Eyes:     General: No scleral icterus. Cardiovascular:     Rate and Rhythm: Normal rate and regular rhythm.  Pulmonary:     Effort: Pulmonary effort is normal.  Neurological:     Mental Status: She is alert.  Psychiatric:        Mood and Affect: Mood normal.    SH: no tobacco       Assessment & Plan:

## 2020-06-08 NOTE — Assessment & Plan Note (Signed)
Flu shot today 

## 2020-06-08 NOTE — Assessment & Plan Note (Signed)
Creat has been stable and I have changed her to [facility] which doesn't need a dose adjustment.  Tolerating well and no concerns. Will check creat today.

## 2020-06-08 NOTE — Addendum Note (Signed)
Addended by: Caffie Pinto on: 06/08/2020 02:05 PM   Modules accepted: Orders

## 2020-06-08 NOTE — Assessment & Plan Note (Signed)
Lipid panel today; lipids managed by her PCP

## 2020-06-08 NOTE — Assessment & Plan Note (Signed)
She continues to do well with no concerns.  Labs today and she will follow up in 1 year.

## 2020-06-09 LAB — T-HELPER CELL (CD4) - (RCID CLINIC ONLY)
CD4 % Helper T Cell: 31 % — ABNORMAL LOW (ref 33–65)
CD4 T Cell Abs: 408 /uL (ref 400–1790)

## 2020-06-09 LAB — COMPLETE METABOLIC PANEL WITH GFR
AST: 11 U/L (ref 10–35)
BUN/Creatinine Ratio: 20 (calc) (ref 6–22)
Creat: 1.48 mg/dL — ABNORMAL HIGH (ref 0.60–0.88)
GFR, Est Non African American: 32 mL/min/{1.73_m2} — ABNORMAL LOW (ref >=60)

## 2020-06-09 LAB — LIPID PANEL: Cholesterol: 178 mg/dL (ref <200)

## 2020-06-09 LAB — RPR: RPR Ser Ql: NONREACTIVE

## 2020-06-11 LAB — LIPID PANEL
HDL: 68 mg/dL (ref >=50)
LDL Cholesterol (Calc): 87 mg/dL (calc)
Non-HDL Cholesterol (Calc): 110 mg/dL (calc) (ref <130)
Total CHOL/HDL Ratio: 2.6 (calc) (ref <5.0)
Triglycerides: 125 mg/dL (ref <150)

## 2020-06-11 LAB — COMPLETE METABOLIC PANEL WITH GFR
AG Ratio: 1.4 (calc) (ref 1.0–2.5)
ALT: 9 U/L (ref 6–29)
Albumin: 4.4 g/dL (ref 3.6–5.1)
Alkaline phosphatase (APISO): 51 U/L (ref 37–153)
BUN: 29 mg/dL — ABNORMAL HIGH (ref 7–25)
CO2: 26 mmol/L (ref 20–32)
Calcium: 10.1 mg/dL (ref 8.6–10.4)
Chloride: 107 mmol/L (ref 98–110)
GFR, Est African American: 37 mL/min/{1.73_m2} — ABNORMAL LOW (ref >=60)
Globulin: 3.2 g/dL (calc) (ref 1.9–3.7)
Glucose, Bld: 96 mg/dL (ref 65–99)
Potassium: 4.3 mmol/L (ref 3.5–5.3)
Sodium: 142 mmol/L (ref 135–146)
Total Bilirubin: 0.3 mg/dL (ref 0.2–1.2)
Total Protein: 7.6 g/dL (ref 6.1–8.1)

## 2020-06-11 LAB — CBC WITH DIFFERENTIAL/PLATELET
Absolute Monocytes: 542 cells/uL (ref 200–950)
Basophils Relative: 1 %
Eosinophils Absolute: 59 cells/uL (ref 15–500)
HCT: 26.6 % — ABNORMAL LOW (ref 35.0–45.0)
Hemoglobin: 9.1 g/dL — ABNORMAL LOW (ref 11.7–15.5)
Lymphs Abs: 1348 cells/uL (ref 850–3900)
MCH: 36.7 pg — ABNORMAL HIGH (ref 27.0–33.0)
MCHC: 34.2 g/dL (ref 32.0–36.0)
MCV: 107.3 fL — ABNORMAL HIGH (ref 80.0–100.0)
MPV: 9.8 fL (ref 7.5–12.5)
Monocytes Relative: 12.9 %
Neutro Abs: 2209 cells/uL (ref 1500–7800)
Neutrophils Relative %: 52.6 %
Platelets: 380 10*3/uL (ref 140–400)
RBC: 2.48 10*6/uL — ABNORMAL LOW (ref 3.80–5.10)
RDW: 12.4 % (ref 11.0–15.0)
Total Lymphocyte: 32.1 %
WBC: 4.2 10*3/uL (ref 3.8–10.8)

## 2020-06-11 LAB — HIV-1 RNA QUANT-NO REFLEX-BLD
HIV 1 RNA Quant: <20 Copies/mL — ABNORMAL HIGH
HIV-1 RNA Quant, Log: <1.3 Log cps/mL — ABNORMAL HIGH

## 2020-08-08 ENCOUNTER — Telehealth: Payer: Self-pay

## 2020-08-08 NOTE — Telephone Encounter (Signed)
MD from [facility] called requesting to dose adjust patient's lamuvidine. Returned call to inform provider that the patient is on [place] and has been since 11/2019. MD stated that the patient has been taking Triumeq since their office is the one filling her medications, despite refills being sent to [facility]. Pharmacist at [facility] notified MD that dosage needed to be changed. Instructed provider that the patient is supposed to be on Biktarvy; gave correct dosage and instructions. Provider apologized and stated he would make sure the switch was made.   Forwarding to Dr. Linus Salmons so he's aware.  Karen Bromell Lorita Officer, RN

## 2021-06-08 NOTE — Progress Notes (Shared)
[facility] Note  06/13/2021     CHIEF COMPLAINT Patient presents for No chief complaint on file.   HISTORY OF PRESENT ILLNESS: Karen Luna is a 88 y.o. female who presents to the clinic today for:     Referring physician: [facility] [place], [place] [id number]  HISTORICAL INFORMATION:   Selected notes from the MEDICAL RECORD NUMBER Ocular Hx- possible ARMD PMH- HTN, HIV    CURRENT MEDICATIONS: No current outpatient medications on file. (Ophthalmic Drugs)   No current facility-administered medications for this visit. (Ophthalmic Drugs)   Current Outpatient Medications (Other)  Medication Sig   acetaminophen (TYLENOL) 650 MG CR tablet Take 650 mg by mouth 3 (three) times daily as needed for pain.   amLODipine (NORVASC) 10 MG tablet Take 10 mg by mouth daily.   bictegravir-emtricitabine-tenofovir AF (BIKTARVY) 50-200-25 MG TABS tablet Take 1 tablet by mouth daily.   chlorthalidone (HYGROTON) 50 MG tablet Take 50 mg by mouth daily.   citalopram (CELEXA) 20 MG tablet Take 20 mg by mouth daily.    fexofenadine (ALLEGRA) 60 MG tablet Take 60 mg by mouth 2 (two) times daily.   hydrALAZINE (APRESOLINE) 100 MG tablet Take 1 tablet (100 mg total) by mouth 3 (three) times daily.   Memantine HCl-Donepezil HCl 21-10 MG CP24 Take 1 capsule by mouth daily.   Multiple Vitamins-Minerals (PRESERVISION AREDS 2) CAPS Take 1 capsule by mouth daily.   olmesartan (BENICAR) 40 MG tablet Take by mouth.   No current facility-administered medications for this visit. (Other)      REVIEW OF SYSTEMS:    ALLERGIES Allergies  Allergen Reactions   Clonidine Derivatives Other (See Comments)    Non-specific heart block    PAST MEDICAL HISTORY Past Medical History:  Diagnosis Date   Anemia of chronic disease 07/06/2011   Aortic atherosclerosis ([place]) 01/06/2015   Seen on CT scan, currently asymptomatic   Cancer ([place])    uterine( per pt)    Chronic kidney disease, stage 3 ([place]) 09/01/2008   Chronic venous insufficiency 10/03/2012   Essential hypertension 09/19/2006   History of cervical cancer 08/11/2012   s/p hysterectomy, remote, specifics unknown    Human immunodeficiency virus (HIV) disease ([place]) 09/12/2006   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 07/25/2012   Low grade squamous intraepithelial lesion (LGSIL) on cervical Pap smear 08/06/2007   LGSIL: 08/06/2007, 11/05/2008, 05/11/2009 ASC-US: 05/27/2008, 08/31/2009, 05/24/2010, 05/23/2012 High Risk HPV: Detected    Onychomycosis of left great toe 09/09/2015   Overweight (BMI 25.0-29.9) 07/25/2012   Past Surgical History:  Procedure Laterality Date   ABDOMINAL HYSTERECTOMY      FAMILY HISTORY Family History  Problem Relation Age of Onset   Hypertension Mother    Heart disease Mother    Hypertension Father    Heart disease Father    Hypertension Sister    HIV/AIDS Daughter    Early death Sister        Gun shot wound   Cerebrovascular Accident Son     SOCIAL HISTORY Social History   Tobacco Use   Smoking status: Former    Types: Cigarettes    Quit date: 08/06/1988    Years since quitting: 32.8   Smokeless tobacco: Never  Substance Use Topics   Alcohol use: No    Alcohol/week: 0.0 standard drinks   Drug use: No         OPHTHALMIC EXAM:  Not recorded  IMAGING AND PROCEDURES  Imaging and Procedures for 06/13/2021           ASSESSMENT/PLAN:    ICD-10-CM   1. Retinal edema  H35.81       1.  2.  3.  Ophthalmic Meds Ordered this visit:  No orders of the defined types were placed in this encounter.      No follow-ups on file.  There are no Patient Instructions on file for this visit.   Explained the diagnoses, plan, and follow up with the patient and they expressed understanding.  Patient expressed understanding of the importance of proper follow up care.   This document serves as a record of services personally performed by  Gardiner Sleeper, MD, PhD. It was created on their behalf by Leonie Douglas, an ophthalmic technician. The creation of this record is the provider's dictation and/or activities during the visit.    Electronically signed by: Leonie Douglas COA, 06/08/21  10:25 AM'  Gardiner Sleeper, M.D., Ph.D. Diseases & Surgery of the Retina and Vitreous [facility] [place]: M myopia (nearsighted); A astigmatism; H hyperopia (farsighted); P presbyopia; Mrx spectacle prescription;  CTL contact lenses; OD right eye; OS left eye; OU both eyes  XT exotropia; ET esotropia; PEK punctate epithelial keratitis; PEE punctate epithelial erosions; DES dry eye syndrome; MGD meibomian gland dysfunction; ATs artificial tears; PFAT's preservative free artificial tears; [place] nuclear sclerotic cataract; PSC posterior subcapsular cataract; ERM epi-retinal membrane; PVD posterior vitreous detachment; RD retinal detachment; DM diabetes mellitus; DR diabetic retinopathy; NPDR non-proliferative diabetic retinopathy; PDR proliferative diabetic retinopathy; CSME clinically significant macular edema; DME diabetic macular edema; dbh dot blot hemorrhages; CWS cotton wool spot; POAG primary open angle glaucoma; C/D cup-to-disc ratio; HVF humphrey visual field; GVF goldmann visual field; OCT optical coherence tomography; IOP intraocular pressure; BRVO Branch retinal vein occlusion; CRVO central retinal vein occlusion; CRAO central retinal artery occlusion; BRAO branch retinal artery occlusion; RT retinal tear; SB scleral buckle; PPV pars plana vitrectomy; VH Vitreous hemorrhage; PRP panretinal laser photocoagulation; IVK intravitreal kenalog; VMT vitreomacular traction; MH Macular hole;  NVD neovascularization of the disc; NVE neovascularization elsewhere; AREDS age related eye disease study; ARMD age related macular degeneration; POAG primary open angle glaucoma; EBMD epithelial/anterior basement membrane dystrophy; ACIOL  anterior chamber intraocular lens; IOL intraocular lens; PCIOL posterior chamber intraocular lens; Phaco/IOL phacoemulsification with intraocular lens placement; [place] photorefractive keratectomy; LASIK laser assisted in situ keratomileusis; HTN hypertension; DM diabetes mellitus; COPD chronic obstructive pulmonary disease

## 2021-06-13 ENCOUNTER — Encounter (INDEPENDENT_AMBULATORY_CARE_PROVIDER_SITE_OTHER): Payer: Medicare Other | Admitting: Ophthalmology

## 2021-06-13 DIAGNOSIS — H3581 Retinal edema: Secondary | ICD-10-CM

## 2021-06-15 ENCOUNTER — Ambulatory Visit (INDEPENDENT_AMBULATORY_CARE_PROVIDER_SITE_OTHER): Payer: Medicare (Managed Care) | Admitting: Internal Medicine

## 2021-06-15 ENCOUNTER — Encounter: Payer: Self-pay | Admitting: Internal Medicine

## 2021-06-15 ENCOUNTER — Other Ambulatory Visit: Payer: Self-pay

## 2021-06-15 VITALS — BP 133/61 | HR 63 | Temp 98.2°F | Wt 131.0 lb

## 2021-06-15 DIAGNOSIS — D638 Anemia in other chronic diseases classified elsewhere: Secondary | ICD-10-CM | POA: Diagnosis not present

## 2021-06-15 DIAGNOSIS — B20 Human immunodeficiency virus [HIV] disease: Secondary | ICD-10-CM | POA: Diagnosis not present

## 2021-06-15 DIAGNOSIS — N1831 Chronic kidney disease, stage 3a: Secondary | ICD-10-CM | POA: Diagnosis not present

## 2021-06-15 DIAGNOSIS — I10 Essential (primary) hypertension: Secondary | ICD-10-CM

## 2021-06-15 MED ORDER — BICTEGRAVIR-EMTRICITAB-TENOFOV 50-200-25 MG PO TABS: 1.0000 | ORAL_TABLET | Freq: Every day | ORAL | 11 refills | Status: DC

## 2021-06-15 NOTE — Assessment & Plan Note (Signed)
Stable creat at 1.62, similar to previous numbers here.  No medication changes indicated.

## 2021-06-15 NOTE — Assessment & Plan Note (Signed)
Now started on iron pills.

## 2021-06-15 NOTE — Assessment & Plan Note (Signed)
BP checked and stable.

## 2021-06-15 NOTE — Assessment & Plan Note (Signed)
She continues to do well on Biktarvy and no new issues.  No changes indicated and labs reviewed from [facility] and CD4 good at 236, viral load < 40.   She can follow up again in 1 year.  Labs done by [facility] and recommend the same labs next year.

## 2021-06-15 NOTE — Progress Notes (Signed)
° °  Subjective:    Patient ID: Karen Luna, female    DOB: 12-31-1931, 88 y.o.   MRN: [id number]  HPI Here for follow up of HIV She continues on Biktarvy and denies any missed doses.  She has had no issues with getting, taking or tolerating the Biktarvy.  She is here with her daughter in law.  She is managed by [facility] and recently started on iron pills.  No new issues otherwise.   Exercising and eating well.    Review of Systems  Constitutional:  Negative for fatigue and unexpected weight change.  Gastrointestinal:  Negative for diarrhea and nausea.  Skin:  Negative for rash.      Objective:   Physical Exam Eyes:     General: No scleral icterus. Pulmonary:     Effort: Pulmonary effort is normal.  Musculoskeletal:     Right lower leg: No edema.     Left lower leg: No edema.  Skin:    Findings: No rash.  Neurological:     General: No focal deficit present.     Mental Status: She is alert.   SH: no tobacco       Assessment & Plan:

## 2021-07-14 NOTE — Progress Notes (Shared)
?[facility] Note ? ?07/18/2021 ? ?  ? ?CHIEF COMPLAINT ?Patient presents for No chief complaint on file. ? ? ?HISTORY OF PRESENT ILLNESS: ?Karen Luna is a 88 y.o. female who presents to the clinic today for:  ? ? ? ?Referring physician: ?[facility] ?[place],   [id number] ? ?HISTORICAL INFORMATION:  ? ?Selected notes from the [place] ?Referred by [facility] for concern of ARMD and HTN ret OU ?LEE:  ?Ocular Hx- ?PMH- ?  ? ?CURRENT MEDICATIONS: ?No current outpatient medications on file. (Ophthalmic Drugs)  ? ?No current facility-administered medications for this visit. (Ophthalmic Drugs)  ? ?Current Outpatient Medications (Other)  ?Medication Sig  ? acetaminophen (TYLENOL) 650 MG CR tablet Take 650 mg by mouth 3 (three) times daily as needed for pain.  ? amLODipine (NORVASC) 10 MG tablet Take 10 mg by mouth daily.  ? bictegravir-emtricitabine-tenofovir AF (BIKTARVY) 50-200-25 MG TABS tablet Take 1 tablet by mouth daily.  ? chlorthalidone (HYGROTON) 50 MG tablet Take 50 mg by mouth daily.  ? citalopram (CELEXA) 20 MG tablet Take 20 mg by mouth daily.   ? Ferrous Sulfate (IRON PO) Take by mouth.  ? fexofenadine (ALLEGRA) 60 MG tablet Take 60 mg by mouth 2 (two) times daily.  ? hydrALAZINE (APRESOLINE) 100 MG tablet Take 1 tablet (100 mg total) by mouth 3 (three) times daily.  ? Memantine HCl-Donepezil HCl 21-10 MG CP24 Take 1 capsule by mouth daily.  ? Multiple Vitamins-Minerals (PRESERVISION AREDS 2) CAPS Take 1 capsule by mouth daily.  ? olmesartan (BENICAR) 40 MG tablet Take by mouth.  ? ?No current facility-administered medications for this visit. (Other)  ? ? ? ? ?REVIEW OF SYSTEMS: ? ? ? ?ALLERGIES ?Allergies  ?Allergen Reactions  ? Clonidine Derivatives Other (See Comments)  ?  Non-specific heart block  ? ? ?PAST MEDICAL HISTORY ?Past Medical History:  ?Diagnosis Date  ? Anemia of chronic disease 07/06/2011  ? Aortic  atherosclerosis ([place]) 01/06/2015  ? Seen on CT scan, currently asymptomatic  ? Cancer [facility])   ? uterine( per pt)  ? Chronic kidney disease, stage 3 ([place]) 09/01/2008  ? Chronic venous insufficiency 10/03/2012  ? Essential hypertension 09/19/2006  ? History of cervical cancer 08/11/2012  ? s/p hysterectomy, remote, specifics unknown   ? Human immunodeficiency virus (HIV) disease ([place]) 09/12/2006  ? 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 07/25/2012  ? Low grade squamous intraepithelial lesion (LGSIL) on cervical Pap smear 08/06/2007  ? LGSIL: 08/06/2007, 11/05/2008, 05/11/2009 ASC-US: 05/27/2008, 08/31/2009, 05/24/2010, 05/23/2012 High Risk HPV: Detected   ? Onychomycosis of left great toe 09/09/2015  ? Overweight (BMI 25.0-29.9) 07/25/2012  ? ?Past Surgical History:  ?Procedure Laterality Date  ? ABDOMINAL HYSTERECTOMY    ? ? ?FAMILY HISTORY ?Family History  ?Problem Relation Age of Onset  ? Hypertension Mother   ? Heart disease Mother   ? Hypertension Father   ? Heart disease Father   ? Hypertension Sister   ? HIV/AIDS Daughter   ? Early death Sister   ?     Gun shot wound  ? Cerebrovascular Accident Son   ? ? ?SOCIAL HISTORY ?Social History  ? ?Tobacco Use  ? Smoking status: Former  ?  Types: Cigarettes  ?  Quit date: 08/06/1988  ?  Years since quitting: 32.9  ? Smokeless tobacco: Never  ?Substance Use Topics  ? Alcohol use: No  ?  Alcohol/week: 0.0  standard drinks  ? Drug use: No  ? ?  ? ?  ? ?OPHTHALMIC EXAM: ? ?Not recorded ?  ? ? ?IMAGING AND PROCEDURES  ?Imaging and Procedures for 07/18/2021 ? ? ? ?  ?  ? ?  ?ASSESSMENT/PLAN: ? ?  ICD-10-CM   ?1. Retinal edema  H35.81   ?  ? ? ?1. ? ?2. ? ?3. ? ?Ophthalmic Meds Ordered this visit:  ?No orders of the defined types were placed in this encounter. ? ? ?  ? ?No follow-ups on file. ? ?There are no Patient Instructions on file for this visit. ? ? ?Explained the diagnoses, plan, and follow up with the patient and they expressed understanding.  Patient expressed understanding of  the importance of proper follow up care.  ? ?This document serves as a record of services personally performed by Gardiner Sleeper, MD, PhD. It was created on their behalf by San Jetty. Owens Shark, OA an ophthalmic technician. The creation of this record is the provider's dictation and/or activities during the visit.   ? ?Electronically signed by: San Jetty. Owens Shark, [place] 03.17.2023 1:07 PM ? ? ?Gardiner Sleeper, M.D., Ph.D. ?Diseases & Surgery of the Retina and Vitreous ?[place] ? ? ? ? ? ?Abbreviations: ?M myopia (nearsighted); A astigmatism; H hyperopia (farsighted); P presbyopia; Mrx spectacle prescription;  CTL contact lenses; OD right eye; OS left eye; OU both eyes  XT exotropia; ET esotropia; PEK punctate epithelial keratitis; PEE punctate epithelial erosions; DES dry eye syndrome; MGD meibomian gland dysfunction; ATs artificial tears; PFAT's preservative free artificial tears; [place] nuclear sclerotic cataract; PSC posterior subcapsular cataract; ERM epi-retinal membrane; PVD posterior vitreous detachment; RD retinal detachment; DM diabetes mellitus; DR diabetic retinopathy; NPDR non-proliferative diabetic retinopathy; PDR proliferative diabetic retinopathy; CSME clinically significant macular edema; DME diabetic macular edema; dbh dot blot hemorrhages; CWS cotton wool spot; POAG primary open angle glaucoma; C/D cup-to-disc ratio; HVF humphrey visual field; GVF goldmann visual field; OCT optical coherence tomography; IOP intraocular pressure; BRVO Branch retinal vein occlusion; CRVO central retinal vein occlusion; CRAO central retinal artery occlusion; BRAO branch retinal artery occlusion; RT retinal tear; SB scleral buckle; PPV pars plana vitrectomy; VH Vitreous hemorrhage; PRP panretinal laser photocoagulation; IVK intravitreal kenalog; VMT vitreomacular traction; MH Macular hole;  NVD neovascularization of the disc; NVE neovascularization elsewhere; AREDS age related eye disease study; ARMD age  related macular degeneration; POAG primary open angle glaucoma; EBMD epithelial/anterior basement membrane dystrophy; ACIOL anterior chamber intraocular lens; IOL intraocular lens; PCIOL posterior chamber intraocular lens; Phaco/IOL phacoemulsification with intraocular lens placement; [place] photorefractive keratectomy; LASIK laser assisted in situ keratomileusis; HTN hypertension; DM diabetes mellitus; COPD chronic obstructive pulmonary disease ? ?

## 2021-07-18 ENCOUNTER — Encounter (INDEPENDENT_AMBULATORY_CARE_PROVIDER_SITE_OTHER): Payer: Medicare (Managed Care) | Admitting: Ophthalmology

## 2021-07-18 DIAGNOSIS — H3581 Retinal edema: Secondary | ICD-10-CM

## 2022-06-26 ENCOUNTER — Encounter: Payer: Self-pay | Admitting: Internal Medicine

## 2022-06-26 ENCOUNTER — Other Ambulatory Visit: Payer: Self-pay

## 2022-06-26 ENCOUNTER — Ambulatory Visit (INDEPENDENT_AMBULATORY_CARE_PROVIDER_SITE_OTHER): Payer: Medicare (Managed Care) | Admitting: Internal Medicine

## 2022-06-26 VITALS — BP 157/81 | HR 65 | Temp 97.4°F | Wt 114.0 lb

## 2022-06-26 DIAGNOSIS — B20 Human immunodeficiency virus [HIV] disease: Secondary | ICD-10-CM

## 2022-06-26 MED ORDER — BICTEGRAVIR-EMTRICITAB-TENOFOV 50-200-25 MG PO TABS: 1.0000 | ORAL_TABLET | Freq: Every day | ORAL | 11 refills | Status: DC

## 2022-06-26 NOTE — Progress Notes (Signed)
   Subjective:    Patient ID: Karen Luna, female    DOB: 03-23-1932, 89 y.o.   MRN: [id number]  HPI Karen Luna is here for follow up of HIV She continues on [place] with no missed doses.  No issues with getting or taking Biktarvy.  She is here with her daughter in law.  She is managed by [facility] and recently started on iron pills.  No new issues otherwise.     Review of Systems  Constitutional:  Negative for fatigue.  Gastrointestinal:  Negative for diarrhea and nausea.  Skin:  Negative for rash.       Objective:   Physical Exam Eyes:     General: No scleral icterus. Pulmonary:     Effort: Pulmonary effort is normal.  Musculoskeletal:     Right lower leg: No edema.     Left lower leg: No edema.  Skin:    Findings: No rash.  Neurological:     General: No focal deficit present.     Mental Status: She is alert.    SH: no tobacco       Assessment & Plan:

## 2022-06-26 NOTE — Assessment & Plan Note (Signed)
Doing well with no concerns.  No labs provided from [facility] so will do today. Refills provided.  She otherwise can rtc in 1 year All other labs and vaccines provided by [facility]  I have personally spent 30 minutes involved in face-to-face and non-face-to-face activities for this patient on the day of the visit. Professional time spent includes the following activities: Preparing to see the patient (review of tests), Obtaining and/or reviewing separately obtained history (admission/discharge record), Performing a medically appropriate examination and/or evaluation , Ordering medications/tests/procedures, referring and communicating with other health care professionals, Documenting clinical information in the EMR, Independently interpreting results (not separately reported), Communicating results to the patient/family/caregiver, Counseling and educating the patient/family/caregiver and Care coordination (not separately reported).

## 2022-06-27 LAB — T-HELPER CELL (CD4) - (RCID CLINIC ONLY)
CD4 % Helper T Cell: 27 % — ABNORMAL LOW (ref 33–65)
CD4 T Cell Abs: 374 /uL — ABNORMAL LOW (ref 400–1790)

## 2022-06-29 LAB — HIV-1 RNA QUANT-NO REFLEX-BLD
HIV 1 RNA Quant: <20 Copies/mL — ABNORMAL HIGH
HIV-1 RNA Quant, Log: <1.3 Log cps/mL — ABNORMAL HIGH

## 2022-09-28 ENCOUNTER — Other Ambulatory Visit: Payer: Self-pay | Admitting: Internal Medicine

## 2022-09-28 DIAGNOSIS — G459 Transient cerebral ischemic attack, unspecified: Secondary | ICD-10-CM

## 2022-10-04 ENCOUNTER — Ambulatory Visit (HOSPITAL_COMMUNITY)
Admission: RE | Admit: 2022-10-04 | Discharge: 2022-10-04 | Disposition: A | Discharge status: Home / Self Care | Payer: Medicare (Managed Care) | Source: Ambulatory Visit | Attending: Internal Medicine | Admitting: Internal Medicine

## 2022-10-04 ENCOUNTER — Ambulatory Visit (HOSPITAL_COMMUNITY): Payer: Medicare (Managed Care)

## 2022-10-04 DIAGNOSIS — G459 Transient cerebral ischemic attack, unspecified: Secondary | ICD-10-CM | POA: Diagnosis present

## 2022-10-15 ENCOUNTER — Ambulatory Visit (HOSPITAL_COMMUNITY): Payer: Medicare (Managed Care)

## 2022-10-16 ENCOUNTER — Other Ambulatory Visit (HOSPITAL_COMMUNITY): Payer: Medicare (Managed Care)

## 2022-12-10 ENCOUNTER — Emergency Department (HOSPITAL_COMMUNITY): Payer: Medicare (Managed Care)

## 2022-12-10 ENCOUNTER — Emergency Department (HOSPITAL_COMMUNITY)
Admission: EM | Admit: 2022-12-10 | Discharge: 2022-12-10 | Disposition: A | Discharge status: Home / Self Care | Payer: Medicare (Managed Care) | Attending: Emergency Medicine | Admitting: Emergency Medicine

## 2022-12-10 ENCOUNTER — Encounter (HOSPITAL_COMMUNITY): Payer: Self-pay

## 2022-12-10 ENCOUNTER — Other Ambulatory Visit: Payer: Self-pay

## 2022-12-10 DIAGNOSIS — Z79899 Other long term (current) drug therapy: Secondary | ICD-10-CM | POA: Insufficient documentation

## 2022-12-10 DIAGNOSIS — R55 Syncope and collapse: Secondary | ICD-10-CM | POA: Insufficient documentation

## 2022-12-10 DIAGNOSIS — I10 Essential (primary) hypertension: Secondary | ICD-10-CM | POA: Diagnosis present

## 2022-12-10 DIAGNOSIS — F0390 Unspecified dementia without behavioral disturbance: Secondary | ICD-10-CM | POA: Insufficient documentation

## 2022-12-10 DIAGNOSIS — N1832 Chronic kidney disease, stage 3b: Secondary | ICD-10-CM | POA: Diagnosis present

## 2022-12-10 DIAGNOSIS — R001 Bradycardia, unspecified: Secondary | ICD-10-CM | POA: Diagnosis not present

## 2022-12-10 DIAGNOSIS — R030 Elevated blood-pressure reading, without diagnosis of hypertension: Secondary | ICD-10-CM

## 2022-12-10 DIAGNOSIS — N390 Urinary tract infection, site not specified: Secondary | ICD-10-CM | POA: Diagnosis not present

## 2022-12-10 DIAGNOSIS — I441 Atrioventricular block, second degree: Secondary | ICD-10-CM | POA: Diagnosis not present

## 2022-12-10 DIAGNOSIS — D649 Anemia, unspecified: Secondary | ICD-10-CM | POA: Diagnosis not present

## 2022-12-10 LAB — CBC
HCT: 29.7 % — ABNORMAL LOW (ref 36.0–46.0)
Hemoglobin: 9.4 g/dL — ABNORMAL LOW (ref 12.0–15.0)
MCH: 34.8 pg — ABNORMAL HIGH (ref 26.0–34.0)
MCHC: 31.6 g/dL (ref 30.0–36.0)
MCV: 110 fL — ABNORMAL HIGH (ref 80.0–100.0)
Platelets: 384 10*3/uL (ref 150–400)
RBC: 2.7 MIL/uL — ABNORMAL LOW (ref 3.87–5.11)
RDW: 13.2 % (ref 11.5–15.5)
WBC: 4.5 10*3/uL (ref 4.0–10.5)
nRBC: 0 % (ref 0.0–0.2)

## 2022-12-10 LAB — BASIC METABOLIC PANEL
Anion gap: 10 (ref 5–15)
BUN: 31 mg/dL — ABNORMAL HIGH (ref 8–23)
CO2: 23 mmol/L (ref 22–32)
Calcium: 9.8 mg/dL (ref 8.9–10.3)
Chloride: 103 mmol/L (ref 98–111)
Creatinine, Ser: 1.46 mg/dL — ABNORMAL HIGH (ref 0.44–1.00)
GFR, Estimated: 34 mL/min — ABNORMAL LOW (ref >60)
Glucose, Bld: 131 mg/dL — ABNORMAL HIGH (ref 70–99)
Potassium: 3.8 mmol/L (ref 3.5–5.1)
Sodium: 136 mmol/L (ref 135–145)

## 2022-12-10 LAB — URINALYSIS, ROUTINE W REFLEX MICROSCOPIC
Bacteria, UA: NONE SEEN
Bilirubin Urine: NEGATIVE
Glucose, UA: NEGATIVE mg/dL
Hgb urine dipstick: NEGATIVE
Ketones, ur: NEGATIVE mg/dL
Nitrite: NEGATIVE
Protein, ur: 30 mg/dL — AB
Specific Gravity, Urine: 1.012 (ref 1.005–1.030)
pH: 5 (ref 5.0–8.0)

## 2022-12-10 LAB — CBG MONITORING, ED: Glucose-Capillary: 108 mg/dL — ABNORMAL HIGH (ref 70–99)

## 2022-12-10 MED ORDER — CEPHALEXIN 500 MG PO CAPS
500.0000 mg | ORAL_CAPSULE | Freq: Once | ORAL | Status: AC
  Administered 2022-12-10: 500 mg via ORAL
  Filled 2022-12-10: qty 1

## 2022-12-10 MED ORDER — CEPHALEXIN 500 MG PO CAPS: 500.0000 mg | ORAL_CAPSULE | Freq: Two times a day (BID) | ORAL | 0 refills | Status: AC

## 2022-12-10 NOTE — Assessment & Plan Note (Signed)
Likely due to her history of [place] type I.  Son states the patient was told she needed a pacemaker about 10 years ago.  She has not seen a cardiologist since then.  She does not normally have syncope.  She is not on any AV nodal blockers.  Patient does not want to be hospitalized.  Son states that he is comfortable taking her home.  Will consult TOC for cardiology referral.  She will likely need Zio patch as an outpatient.

## 2022-12-10 NOTE — Assessment & Plan Note (Signed)
Patient is not taking any AV nodal blockers.

## 2022-12-10 NOTE — Subjective & Objective (Addendum)
CC: syncope HPI: 89 year old African-American female history of HIV disease, hypertension, CKD stage III, anemia chronic kidney disease, dementia who presents to the ER today with a syncopal episode at home.  Her son Chrissie Noa is at the bedside.  He states that the patient often turns off the air conditioner the house because she states that she feels cold.  He has not set up about 76 degrees.  When he went to go visit her today, the house was about 87 degrees and the air conditioner was off.  Patient had passed out while she was in bed.  The son states that she had a heart block while they were visiting [place] about 10 years ago and was told that the patient needed a pacemaker.  She has a history of chronic Mobitz type I with bradycardia.  She has a similar EKG today that shows Mobitz 1 block.  Her labs show chronic kidney disease which she has.  Hemoglobin is stable at 9.4.  CT head shows small vessel ischemic disease without acute intracranial abnormality.  Patient and son are not interested in hospital admission and would rather go home.  I did offer them hospital admission but they politely declined it.  I will have the ER nurse ambulate the patient in the ER to make sure that she is not dizzy while walking.  If she is able to ambulate without difficulty, I think she is medically stable for discharge.  She probably does need an outpatient Zio patch.  Will place a [facility] consult for cardiology referral.

## 2022-12-10 NOTE — ED Notes (Signed)
Ambulated patient through department. Patient ambulated with no difficulties. Denies any dizziness

## 2022-12-10 NOTE — ED Notes (Signed)
Pt given fluids to drink per order. Pt states she can not give urine sample at this time

## 2022-12-10 NOTE — Discharge Instructions (Addendum)
It was our pleasure to provide your ER care today - we hope that you feel better.  Drink plenty of fluids/stay well hydrated. Stay in cool environment, use air conditioning when outdoor temperature is warm/hot.   Your lab tests show a possible uti - take antibiotic as prescribed.   Follow up with primary care doctor in one week.   Return to ER if worse, new symptoms, high fevers, new/severe pain, chest pain, trouble breathing, fainting, or other concern.

## 2022-12-10 NOTE — ED Triage Notes (Signed)
Pt son reports pt has been turning the air off in her house and after walking with her CNA today she appeared to pass out on the bed when they got back from walking.  He is not sure if it was heat related or not.

## 2022-12-10 NOTE — Assessment & Plan Note (Signed)
Stable.  Baseline creatinine 1.3-1.6.

## 2022-12-10 NOTE — Consult Note (Signed)
Hospitalist Consultation History and Physical    Karen Luna QMV:[id number] DOB: 09-16-1931 DOA: 12/10/2022  DOS: the patient was seen and examined on 12/10/2022  PCP: Patient, No Pcp Per   Patient coming from: Home  I have personally briefly reviewed patient's old medical records in [facility]  CC: syncope HPI: 89 year old African-American female history of HIV disease, hypertension, CKD stage III, anemia chronic kidney disease, dementia who presents to the ER today with a syncopal episode at home.  Her son Chrissie Noa is at the bedside.  He states that the patient often turns off the air conditioner the house because she states that she feels cold.  He has not set up about 76 degrees.  When he went to go visit her today, the house was about 87 degrees and the air conditioner was off.  Patient had passed out while she was in bed.  The son states that she had a heart block while they were visiting [place] about 10 years ago and was told that the patient needed a pacemaker.  She has a history of chronic Mobitz type I with bradycardia.  She has a similar EKG today that shows Mobitz 1 block.  Her labs show chronic kidney disease which she has.  Hemoglobin is stable at 9.4.  CT head shows small vessel ischemic disease without acute intracranial abnormality.  Patient and son are not interested in hospital admission and would rather go home.  I did offer them hospital admission but they politely declined it.  I will have the ER nurse ambulate the patient in the ER to make sure that she is not dizzy while walking.  If she is able to ambulate without difficulty, I think she is medically stable for discharge.  She probably does need an outpatient Zio patch.  Will place a [facility] consult for cardiology referral.   ED Course: telemetry shows mobitz type 1. Scr 1.46 which is at baseline. CT head shows no acute intra-cranial process.  Review of Systems:  Review of Systems  Constitutional:  Negative.   HENT: Negative.    Eyes: Negative.   Respiratory: Negative.  Negative for sputum production and shortness of breath.   Cardiovascular:  Negative for chest pain.       Syncope while lying in bed  Gastrointestinal: Negative.   Genitourinary: Negative.   Musculoskeletal: Negative.   Skin: Negative.   Neurological: Negative.   Endo/Heme/Allergies: Negative.   Psychiatric/Behavioral: Negative.    All other systems reviewed and are negative.   Past Medical History:  Diagnosis Date   Anemia of chronic disease 07/06/2011   Aortic atherosclerosis (HCC) 01/06/2015   Seen on CT scan, currently asymptomatic   Cancer (HCC)    uterine( per pt)   Chronic kidney disease, stage 3 (HCC) 09/01/2008   Chronic venous insufficiency 10/03/2012   Essential hypertension 09/19/2006   History of cervical cancer 08/11/2012   s/p hysterectomy, remote, specifics unknown    Human immunodeficiency virus (HIV) disease (HCC) 09/12/2006   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 07/25/2012   Low grade squamous intraepithelial lesion (LGSIL) on cervical Pap smear 08/06/2007   LGSIL: 08/06/2007, 11/05/2008, 05/11/2009 ASC-US: 05/27/2008, 08/31/2009, 05/24/2010, 05/23/2012 High Risk HPV: Detected    Onychomycosis of left great toe 09/09/2015   Overweight (BMI 25.0-29.9) 07/25/2012    Past Surgical History:  Procedure Laterality Date   ABDOMINAL HYSTERECTOMY       reports that she quit smoking about 34 years ago. Her smoking use included  cigarettes. She has never used smokeless tobacco. She reports that she does not drink alcohol and does not use drugs.  Allergies  Allergen Reactions   Clonidine Derivatives Other (See Comments)    Non-specific heart block    Family History  Problem Relation Age of Onset   Hypertension Mother    Heart disease Mother    Hypertension Father    Heart disease Father    Hypertension Sister    HIV/AIDS Daughter    Early death Sister        Gun shot wound    Cerebrovascular Accident Son     Prior to Admission medications   Medication Sig Start Date End Date Taking? Authorizing Provider  acetaminophen (TYLENOL) 650 MG CR tablet Take 650 mg by mouth 3 (three) times daily as needed for pain.    [provider]  amLODipine (NORVASC) 10 MG tablet Take 10 mg by mouth daily.    [provider]  bictegravir-emtricitabine-tenofovir AF (BIKTARVY) 50-200-25 MG TABS tablet Take 1 tablet by mouth daily. 06/26/22   Gardiner Barefoot, MD  chlorthalidone (HYGROTON) 50 MG tablet Take 50 mg by mouth daily.    [provider]  citalopram (CELEXA) 20 MG tablet Take 20 mg by mouth daily.     [provider]  Ferrous Sulfate (IRON PO) Take by mouth.    [provider]  fexofenadine (ALLEGRA) 60 MG tablet Take 60 mg by mouth 2 (two) times daily.    [provider]  hydrALAZINE (APRESOLINE) 100 MG tablet Take 1 tablet (100 mg total) by mouth 3 (three) times daily. 09/09/15   Doneen Poisson, MD  Memantine HCl-Donepezil HCl 21-10 MG CP24 Take 1 capsule by mouth daily.    [provider]  Multiple Vitamins-Minerals (PRESERVISION AREDS 2) CAPS Take 1 capsule by mouth daily.    [provider]  olmesartan (BENICAR) 40 MG tablet Take by mouth.    [provider]    Physical Exam: Vitals:   12/10/22 1623 12/10/22 1800 12/10/22 1815 12/10/22 2027  BP: (!) 151/61 (!) 176/73 (!) 127/54 (!) 149/60  Pulse: 61 63 (!) 46 (!) 42  Resp: 20 17 16 11   Temp: 98.7 F (37.1 C)   98 F (36.7 C)  TempSrc: Oral   Oral  SpO2: 99% 99% 99% 100%  Weight:      Height:        Physical Exam Vitals and nursing note reviewed.  Constitutional:      General: She is not in acute distress.    Appearance: She is not toxic-appearing or diaphoretic.  HENT:     Head: Normocephalic and atraumatic.     Nose: Nose normal.  Eyes:     General: No scleral icterus. Cardiovascular:     Rate and Rhythm: Regular rhythm.  Bradycardia present.     Comments: Mobitz 1 on telemetry Abdominal:     General: Abdomen is flat. Bowel sounds are normal. There is no distension.     Palpations: Abdomen is soft.     Tenderness: There is no abdominal tenderness.  Musculoskeletal:     Right lower leg: No edema.     Left lower leg: No edema.  Skin:    General: Skin is warm and dry.     Capillary Refill: Capillary refill takes less than 2 seconds.  Neurological:     General: No focal deficit present.     Mental Status: She is alert.      Labs on  Admission: I have personally reviewed following labs and imaging studies  CBC: Recent Labs  Lab 12/10/22 1658  WBC 4.5  HGB 9.4*  HCT 29.7*  MCV 110.0*  PLT 384   Basic Metabolic Panel: Recent Labs  Lab 12/10/22 1658  NA 136  K 3.8  CL 103  CO2 23  GLUCOSE 131*  BUN 31*  CREATININE 1.46*  CALCIUM 9.8   GFR: Estimated Creatinine Clearance: 21.3 mL/min (A) (by C-G formula based on SCr of 1.46 mg/dL (H)). CBG: Recent Labs  Lab 12/10/22 1802  GLUCAP 108*   Urine analysis:    Component Value Date/Time   COLORURINE YELLOW 12/10/2022 1836   APPEARANCEUR CLEAR 12/10/2022 1836   LABSPEC 1.012 12/10/2022 1836   PHURINE 5.0 12/10/2022 1836   GLUCOSEU NEGATIVE 12/10/2022 1836   GLUCOSEU NEG mg/dL 96/07/5407 8119   HGBUR NEGATIVE 12/10/2022 1836   BILIRUBINUR NEGATIVE 12/10/2022 1836   KETONESUR NEGATIVE 12/10/2022 1836   PROTEINUR 30 (A) 12/10/2022 1836   UROBILINOGEN 0.2 07/23/2013 1431   NITRITE NEGATIVE 12/10/2022 1836   LEUKOCYTESUR SMALL (A) 12/10/2022 1836    Radiological Exams on Admission: I have personally reviewed images CT Head Wo Contrast  Result Date: 12/10/2022 CLINICAL DATA:  Near syncope EXAM: CT HEAD WITHOUT CONTRAST TECHNIQUE: Contiguous axial images were obtained from the base of the skull through the vertex without intravenous contrast. RADIATION DOSE REDUCTION: This exam was performed according to the departmental  dose-optimization program which includes automated exposure control, adjustment of the mA and/or kV according to patient size and/or use of iterative reconstruction technique. COMPARISON:  MRI brain dated 10/04/2022 FINDINGS: Brain: No evidence of acute infarction, hemorrhage, hydrocephalus, extra-axial collection or mass lesion/mass effect. Subcortical white matter and periventricular small vessel ischemic changes. Vascular: Intracranial atherosclerosis. Skull: Normal. Negative for fracture or focal lesion. Sinuses/Orbits: Complete opacification of the right maxillary sinus, chronic. Mastoid air cells are clear. Other: None. IMPRESSION: No acute intracranial abnormality. Small vessel ischemic changes. Electronically Signed   By: Charline Bills M.D.   On: 12/10/2022 19:20    EKG: My personal interpretation of EKG shows: NSR, mobitz 1    Assessment/Plan Active Problems:   Essential hypertension   CKD stage 3b, GFR 30-44 ml/min (HCC) - baseline SCr 1.3-1.6   Syncope    Assessment and Plan: Syncope Likely due to her history of Mobitz type I.  Son states the patient was told she needed a pacemaker about 10 years ago.  She has not seen a cardiologist since then.  She does not normally have syncope.  She is not on any AV nodal blockers.  Patient does not want to be hospitalized.  Son states that he is comfortable taking her home.  Will consult TOC for cardiology referral.  She will likely need Zio patch as an outpatient.  CKD stage 3b, GFR 30-44 ml/min (HCC) - baseline SCr 1.3-1.6 Stable.  Baseline creatinine 1.3-1.6.  Essential hypertension Patient is not taking any AV nodal blockers.   Family Communication: discussed with pt and son Chrissie Noa.   Disposition Plan: return home  Admission status:  pt and son both decline pt to be admitted ,  pt and son would rather go home. Willing to f/u with outpatient cardiology appointment.  Medically stable for discharge.   Carollee Herter, DO [facility] 12/10/2022, 10:19 PM

## 2022-12-10 NOTE — ED Provider Notes (Addendum)
[place] EMERGENCY DEPARTMENT AT [facility] Provider Note   CSN: [id number] Arrival date & time: 12/10/22  1542     History  Chief Complaint  Patient presents with   Loss of Consciousness    Karen Luna is a 89 y.o. female.  Pt with ?episode of passing out.  Per report, pt appeared to pass out on bed. Pt iis limited historian,  ?dementia, level 5 caveat. Pt denies specific c/o, indicates she feels fine. No headache. No neck/back pain. No chest pain or discomfort. No sob or unusual doe. No cough or uri symptoms. No abd pain or nvd. No rectal bleeding or melena. No dysuria or gu c/o. No extremity pain, swelling or injury. Denies change in vision or speech. No new numbness/weakness. Pt unaware of any recent change in meds or new meds. Denies feeling faint.   The history is provided by the patient, medical records and a relative. The history is limited by the condition of the patient.  Loss of Consciousness Associated symptoms: no chest pain, no fever, no headaches, no shortness of breath, no vomiting and no weakness        Home Medications Prior to Admission medications   Medication Sig Start Date End Date Taking? Authorizing Provider  acetaminophen (TYLENOL) 650 MG CR tablet Take 650 mg by mouth 3 (three) times daily as needed for pain.    [provider]  amLODipine (NORVASC) 10 MG tablet Take 10 mg by mouth daily.    [provider]  bictegravir-emtricitabine-tenofovir AF (BIKTARVY) 50-200-25 MG TABS tablet Take 1 tablet by mouth daily. 06/26/22   Gardiner Barefoot, MD  chlorthalidone (HYGROTON) 50 MG tablet Take 50 mg by mouth daily.    [provider]  citalopram (CELEXA) 20 MG tablet Take 20 mg by mouth daily.     [provider]  Ferrous Sulfate (IRON PO) Take by mouth.    [provider]  fexofenadine (ALLEGRA) 60 MG tablet Take 60 mg by mouth 2 (two) times daily.    [provider]  hydrALAZINE (APRESOLINE)  100 MG tablet Take 1 tablet (100 mg total) by mouth 3 (three) times daily. 09/09/15   Doneen Poisson, MD  Memantine HCl-Donepezil HCl 21-10 MG CP24 Take 1 capsule by mouth daily.    [provider]  Multiple Vitamins-Minerals (PRESERVISION AREDS 2) CAPS Take 1 capsule by mouth daily.    [provider]  olmesartan (BENICAR) 40 MG tablet Take by mouth.    [provider]      Allergies    Clonidine derivatives    Review of Systems   Review of Systems  Constitutional:  Negative for chills and fever.  HENT:  Negative for sore throat.   Eyes:  Negative for visual disturbance.  Respiratory:  Negative for cough and shortness of breath.   Cardiovascular:  Positive for syncope. Negative for chest pain.  Gastrointestinal:  Negative for abdominal pain, blood in stool, diarrhea and vomiting.  Genitourinary:  Negative for dysuria and flank pain.  Musculoskeletal:  Negative for back pain and neck pain.  Skin:  Negative for rash.  Neurological:  Negative for speech difficulty, weakness, numbness and headaches.    Physical Exam Updated Vital Signs BP (!) 149/60 (BP Location: Left Arm)   Pulse (!) 42   Temp 98 F (36.7 C) (Oral)   Resp 11   Ht 1.727 m (5\' 8" )   Wt 51.7 kg   SpO2 100%   BMI 17.33 kg/m  Physical Exam Vitals and nursing note reviewed.  Constitutional:      Appearance: Normal appearance. She is well-developed.  HENT:     Head: Atraumatic.     Nose: Nose normal.     Mouth/Throat:     Mouth: Mucous membranes are moist.  Eyes:     General: No scleral icterus.    Conjunctiva/sclera: Conjunctivae normal.     Pupils: Pupils are equal, round, and reactive to light.  Neck:     Vascular: No carotid bruit.     Trachea: No tracheal deviation.  Cardiovascular:     Rate and Rhythm: Normal rate and regular rhythm.     Pulses: Normal pulses.     Heart sounds: Normal heart sounds. No murmur heard.    No friction rub. No gallop.  Pulmonary:     Effort:  Pulmonary effort is normal. No respiratory distress.     Breath sounds: Normal breath sounds.  Chest:     Chest wall: No tenderness.  Abdominal:     General: Bowel sounds are normal. There is no distension.     Palpations: Abdomen is soft. There is no mass.     Tenderness: There is no abdominal tenderness. There is no guarding.  Genitourinary:    Comments: No cva tenderness.  Musculoskeletal:        General: No swelling or tenderness.     Cervical back: Normal range of motion and neck supple. No rigidity or tenderness. No muscular tenderness.     Right lower leg: No edema.     Left lower leg: No edema.     Comments: CTLS spine, non tender, aligned, no step off. Good rom bil extremities without pain or focal bony tenderness.   Skin:    General: Skin is warm and dry.     Findings: No rash.  Neurological:     Mental Status: She is alert.     Comments: Alert, speech normal. GCS 15. Motor/sens grossly intact bil.   Psychiatric:        Mood and Affect: Mood normal.     ED Results / Procedures / Treatments   Labs (all labs ordered are listed, but only abnormal results are displayed) Results for orders placed or performed during the hospital encounter of 12/10/22  Basic metabolic panel  Result Value Ref Range   Sodium 136 135 - 145 mmol/L   Potassium 3.8 3.5 - 5.1 mmol/L   Chloride 103 98 - 111 mmol/L   CO2 23 22 - 32 mmol/L   Glucose, Bld 131 (H) 70 - 99 mg/dL   BUN 31 (H) 8 - 23 mg/dL   Creatinine, Ser 0.98 (H) 0.44 - 1.00 mg/dL   Calcium 9.8 8.9 - 11.9 mg/dL   GFR, Estimated 34 (L) >60 mL/min   Anion gap 10 5 - 15  CBC  Result Value Ref Range   WBC 4.5 4.0 - 10.5 K/uL   RBC 2.70 (L) 3.87 - 5.11 MIL/uL   Hemoglobin 9.4 (L) 12.0 - 15.0 g/dL   HCT 14.7 (L) 82.9 - 56.2 %   MCV 110.0 (H) 80.0 - 100.0 fL   MCH 34.8 (H) 26.0 - 34.0 pg   MCHC 31.6 30.0 - 36.0 g/dL   RDW 13.0 86.5 - 78.4 %   Platelets 384 150 - 400 K/uL   nRBC 0.0 0.0 - 0.2 %  Urinalysis, Routine w reflex  microscopic -Urine, Clean Catch  Result Value Ref Range   Color, Urine YELLOW YELLOW  APPearance CLEAR CLEAR   Specific Gravity, Urine 1.012 1.005 - 1.030   pH 5.0 5.0 - 8.0   Glucose, UA NEGATIVE NEGATIVE mg/dL   Hgb urine dipstick NEGATIVE NEGATIVE   Bilirubin Urine NEGATIVE NEGATIVE   Ketones, ur NEGATIVE NEGATIVE mg/dL   Protein, ur 30 (A) NEGATIVE mg/dL   Nitrite NEGATIVE NEGATIVE   Leukocytes,Ua SMALL (A) NEGATIVE   RBC / HPF 0-5 0 - 5 RBC/hpf   WBC, UA 11-20 0 - 5 WBC/hpf   Bacteria, UA NONE SEEN NONE SEEN   Squamous Epithelial / HPF 0-5 0 - 5 /HPF  CBG monitoring, ED  Result Value Ref Range   Glucose-Capillary 108 (H) 70 - 99 mg/dL      EKG EKG Interpretation Date/Time:  Monday December 10 2022 16:27:04 EDT Ventricular Rate:  60 PR Interval:  218 QRS Duration:  84 QT Interval:  414 QTC Calculation: 414 R Axis:   67  Text Interpretation: Sinus rhythm with 1st degree A-V block Minimal voltage criteria for LVH, may be normal variant ( Sokolow-Lyon ) Nonspecific ST and T wave abnormality Confirmed by Cathren Laine ([id number]) on 12/10/2022 6:07:12 PM  Radiology CT Head Wo Contrast  Result Date: 12/10/2022 CLINICAL DATA:  Near syncope EXAM: CT HEAD WITHOUT CONTRAST TECHNIQUE: Contiguous axial images were obtained from the base of the skull through the vertex without intravenous contrast. RADIATION DOSE REDUCTION: This exam was performed according to the departmental dose-optimization program which includes automated exposure control, adjustment of the mA and/or kV according to patient size and/or use of iterative reconstruction technique. COMPARISON:  MRI brain dated 10/04/2022 FINDINGS: Brain: No evidence of acute infarction, hemorrhage, hydrocephalus, extra-axial collection or mass lesion/mass effect. Subcortical white matter and periventricular small vessel ischemic changes. Vascular: Intracranial atherosclerosis. Skull: Normal. Negative for fracture or focal lesion.  Sinuses/Orbits: Complete opacification of the right maxillary sinus, chronic. Mastoid air cells are clear. Other: None. IMPRESSION: No acute intracranial abnormality. Small vessel ischemic changes. Electronically Signed   By: Charline Bills M.D.   On: 12/10/2022 19:20    Procedures Procedures    Medications Ordered in ED Medications  cephALEXin (KEFLEX) capsule 500 mg (has no administration in time range)    ED Course/ Medical Decision Making/ A&P                                 Medical Decision Making Problems Addressed: Acute UTI: acute illness or injury with systemic symptoms Chronic anemia: chronic illness or injury that poses a threat to life or bodily functions Elevated blood pressure reading: acute illness or injury Second degree heart block: acute illness or injury with systemic symptoms that poses a threat to life or bodily functions Symptomatic bradycardia: acute illness or injury with systemic symptoms that poses a threat to life or bodily functions Syncope and collapse: acute illness or injury with systemic symptoms that poses a threat to life or bodily functions  Amount and/or Complexity of Data Reviewed Independent Historian:     Details: Fam, hx External Data Reviewed: labs and notes. Labs: ordered. Decision-making details documented in ED Course. Radiology: ordered and independent interpretation performed. Decision-making details documented in ED Course. ECG/medicine tests: ordered and independent interpretation performed. Decision-making details documented in ED Course. Discussion of management or test interpretation with external provider(s): Medicine/hospitalists.   Risk Prescription drug management. Decision regarding hospitalization.   Iv ns. Continuous pulse ox and cardiac monitoring. Labs ordered/sent. Imaging ordered.  Differential diagnosis includes syncope, dehydration, anemia, etc. Dispo decision including potential need for admission considered  - will get labs and imaging and reassess.   Reviewed nursing notes and prior charts for additional history. External reports reviewed. Additional history from: EMS.   Cardiac monitor: sinus rhythm, rate 66.  Po fluids/food.   Labs reviewed/interpreted by me - wbc normal. Hct c/w prior. Chem normal. Ua w possible uti. Keflex po.   CT reviewed/interpreted by me - no hem.   Recheck monitor w hr as low as 35. Appears 2nd degree block. Repeat ecg. No cp or sob. No b-blocker use.  Given syncope, bradycardia - will admit/obs. Hospitalists consulted for admission.  CRITICAL CARE RE: syncope w symptomatic bradycardia/second degree heart block.  Performed by: Suzi Roots Total critical care time: 40 minutes Critical care time was exclusive of separately billable procedures and treating other patients. Critical care was necessary to treat or prevent imminent or life-threatening deterioration. Critical care was time spent personally by me on the following activities: development of treatment plan with patient and/or surrogate as well as nursing, discussions with consultants, evaluation of patient's response to treatment, examination of patient, obtaining history from patient or surrogate, ordering and performing treatments and interventions, ordering and review of laboratory studies, ordering and review of radiographic studies, pulse oximetry and re-evaluation of patient's condition.          Final Clinical Impression(s) / ED Diagnoses Final diagnoses:  None    Rx / DC Orders ED Discharge Orders     None         Cathren Laine, MD 12/10/22 2131

## 2023-03-25 ENCOUNTER — Encounter (HOSPITAL_COMMUNITY): Payer: Self-pay | Admitting: Radiology

## 2023-03-25 ENCOUNTER — Other Ambulatory Visit: Payer: Self-pay

## 2023-03-25 ENCOUNTER — Observation Stay (HOSPITAL_COMMUNITY)
Admission: EM | Admit: 2023-03-25 | Discharge: 2023-03-26 | Disposition: A | Discharge status: Home / Self Care | Payer: Medicare (Managed Care) | Attending: Internal Medicine | Admitting: Internal Medicine

## 2023-03-25 DIAGNOSIS — D631 Anemia in chronic kidney disease: Secondary | ICD-10-CM | POA: Insufficient documentation

## 2023-03-25 DIAGNOSIS — R2689 Other abnormalities of gait and mobility: Secondary | ICD-10-CM | POA: Insufficient documentation

## 2023-03-25 DIAGNOSIS — I129 Hypertensive chronic kidney disease with stage 1 through stage 4 chronic kidney disease, or unspecified chronic kidney disease: Secondary | ICD-10-CM | POA: Insufficient documentation

## 2023-03-25 DIAGNOSIS — K625 Hemorrhage of anus and rectum: Secondary | ICD-10-CM | POA: Diagnosis not present

## 2023-03-25 DIAGNOSIS — Z8541 Personal history of malignant neoplasm of cervix uteri: Secondary | ICD-10-CM | POA: Diagnosis not present

## 2023-03-25 DIAGNOSIS — Z79899 Other long term (current) drug therapy: Secondary | ICD-10-CM | POA: Insufficient documentation

## 2023-03-25 DIAGNOSIS — R2681 Unsteadiness on feet: Secondary | ICD-10-CM | POA: Insufficient documentation

## 2023-03-25 DIAGNOSIS — Z87891 Personal history of nicotine dependence: Secondary | ICD-10-CM | POA: Diagnosis not present

## 2023-03-25 DIAGNOSIS — E875 Hyperkalemia: Secondary | ICD-10-CM | POA: Insufficient documentation

## 2023-03-25 DIAGNOSIS — G309 Alzheimer's disease, unspecified: Secondary | ICD-10-CM | POA: Insufficient documentation

## 2023-03-25 DIAGNOSIS — R296 Repeated falls: Secondary | ICD-10-CM | POA: Diagnosis not present

## 2023-03-25 DIAGNOSIS — Z9889 Other specified postprocedural states: Secondary | ICD-10-CM | POA: Insufficient documentation

## 2023-03-25 DIAGNOSIS — Z8542 Personal history of malignant neoplasm of other parts of uterus: Secondary | ICD-10-CM | POA: Diagnosis not present

## 2023-03-25 DIAGNOSIS — N179 Acute kidney failure, unspecified: Principal | ICD-10-CM | POA: Insufficient documentation

## 2023-03-25 DIAGNOSIS — N189 Chronic kidney disease, unspecified: Secondary | ICD-10-CM | POA: Diagnosis present

## 2023-03-25 DIAGNOSIS — K469 Unspecified abdominal hernia without obstruction or gangrene: Secondary | ICD-10-CM | POA: Insufficient documentation

## 2023-03-25 DIAGNOSIS — F0283 Dementia in other diseases classified elsewhere, unspecified severity, with mood disturbance: Secondary | ICD-10-CM | POA: Insufficient documentation

## 2023-03-25 DIAGNOSIS — Z21 Asymptomatic human immunodeficiency virus [HIV] infection status: Secondary | ICD-10-CM | POA: Diagnosis not present

## 2023-03-25 DIAGNOSIS — F0280 Dementia in other diseases classified elsewhere without behavioral disturbance: Secondary | ICD-10-CM | POA: Diagnosis not present

## 2023-03-25 DIAGNOSIS — N183 Chronic kidney disease, stage 3 unspecified: Secondary | ICD-10-CM | POA: Insufficient documentation

## 2023-03-25 DIAGNOSIS — I10 Essential (primary) hypertension: Secondary | ICD-10-CM | POA: Diagnosis not present

## 2023-03-25 DIAGNOSIS — N170 Acute kidney failure with tubular necrosis: Secondary | ICD-10-CM | POA: Diagnosis present

## 2023-03-25 DIAGNOSIS — F32A Depression, unspecified: Secondary | ICD-10-CM | POA: Diagnosis not present

## 2023-03-25 DIAGNOSIS — R1900 Intra-abdominal and pelvic swelling, mass and lump, unspecified site: Secondary | ICD-10-CM | POA: Diagnosis present

## 2023-03-25 DIAGNOSIS — R799 Abnormal finding of blood chemistry, unspecified: Secondary | ICD-10-CM

## 2023-03-25 LAB — URINALYSIS, ROUTINE W REFLEX MICROSCOPIC
Bilirubin Urine: NEGATIVE
Glucose, UA: NEGATIVE mg/dL
Hgb urine dipstick: NEGATIVE
Ketones, ur: NEGATIVE mg/dL
Nitrite: NEGATIVE
Protein, ur: 30 mg/dL — AB
Specific Gravity, Urine: 1.017 (ref 1.005–1.030)
pH: 7 (ref 5.0–8.0)

## 2023-03-25 LAB — COMPREHENSIVE METABOLIC PANEL
ALT: 15 U/L (ref 0–44)
AST: 17 U/L (ref 15–41)
Albumin: 4 g/dL (ref 3.5–5.0)
Alkaline Phosphatase: 75 U/L (ref 38–126)
Anion gap: 9 (ref 5–15)
BUN: 67 mg/dL — ABNORMAL HIGH (ref 8–23)
CO2: 23 mmol/L (ref 22–32)
Calcium: 9.8 mg/dL (ref 8.9–10.3)
Chloride: 110 mmol/L (ref 98–111)
Creatinine, Ser: 1.86 mg/dL — ABNORMAL HIGH (ref 0.44–1.00)
GFR, Estimated: 26 mL/min — ABNORMAL LOW (ref >60)
Glucose, Bld: 101 mg/dL — ABNORMAL HIGH (ref 70–99)
Potassium: 5.8 mmol/L — ABNORMAL HIGH (ref 3.5–5.1)
Sodium: 142 mmol/L (ref 135–145)
Total Bilirubin: 0.6 mg/dL (ref <1.2)
Total Protein: 7.7 g/dL (ref 6.5–8.1)

## 2023-03-25 LAB — CBC WITH DIFFERENTIAL/PLATELET
Abs Immature Granulocytes: 0.01 10*3/uL (ref 0.00–0.07)
Basophils Absolute: 0.1 10*3/uL (ref 0.0–0.1)
Basophils Relative: 1 %
Eosinophils Absolute: 0.1 10*3/uL (ref 0.0–0.5)
Eosinophils Relative: 2 %
HCT: 26 % — ABNORMAL LOW (ref 36.0–46.0)
Hemoglobin: 8.3 g/dL — ABNORMAL LOW (ref 12.0–15.0)
Immature Granulocytes: 0 %
Lymphocytes Relative: 22 %
Lymphs Abs: 1.1 10*3/uL (ref 0.7–4.0)
MCH: 36.9 pg — ABNORMAL HIGH (ref 26.0–34.0)
MCHC: 31.9 g/dL (ref 30.0–36.0)
MCV: 115.6 fL — ABNORMAL HIGH (ref 80.0–100.0)
Monocytes Absolute: 0.8 10*3/uL (ref 0.1–1.0)
Monocytes Relative: 16 %
Neutro Abs: 3.1 10*3/uL (ref 1.7–7.7)
Neutrophils Relative %: 59 %
Platelets: 319 10*3/uL (ref 150–400)
RBC: 2.25 MIL/uL — ABNORMAL LOW (ref 3.87–5.11)
RDW: 13.8 % (ref 11.5–15.5)
WBC: 5.2 10*3/uL (ref 4.0–10.5)
nRBC: 0 % (ref 0.0–0.2)

## 2023-03-25 LAB — HEMOGLOBIN AND HEMATOCRIT, BLOOD
HCT: 27.4 % — ABNORMAL LOW (ref 36.0–46.0)
Hemoglobin: 8.6 g/dL — ABNORMAL LOW (ref 12.0–15.0)

## 2023-03-25 LAB — POC OCCULT BLOOD, ED: Fecal Occult Bld: NEGATIVE

## 2023-03-25 MED ORDER — ACETAMINOPHEN 650 MG RE SUPP: 650.0000 mg | Freq: Four times a day (QID) | RECTAL | Status: DC | PRN

## 2023-03-25 MED ORDER — LORATADINE 10 MG PO TABS
10.0000 mg | ORAL_TABLET | Freq: Every day | ORAL | Status: DC
  Administered 2023-03-26: 10 mg via ORAL
  Filled 2023-03-25: qty 1

## 2023-03-25 MED ORDER — ACETAMINOPHEN 325 MG PO TABS: 650.0000 mg | ORAL_TABLET | Freq: Four times a day (QID) | ORAL | Status: DC | PRN

## 2023-03-25 MED ORDER — MEMANTINE HCL-DONEPEZIL HCL ER 21-10 MG PO CP24: 1.0000 | ORAL_CAPSULE | Freq: Every day | ORAL | Status: DC

## 2023-03-25 MED ORDER — LACTATED RINGERS IV BOLUS
1000.0000 mL | Freq: Once | INTRAVENOUS | Status: AC
  Administered 2023-03-25: 1000 mL via INTRAVENOUS

## 2023-03-25 MED ORDER — FERROUS FUMARATE 324 (106 FE) MG PO TABS
1.0000 | ORAL_TABLET | Freq: Every day | ORAL | Status: DC
  Administered 2023-03-26: 106 mg via ORAL
  Filled 2023-03-25 (×3): qty 1

## 2023-03-25 MED ORDER — CHOLECALCIFEROL 10 MCG (400 UNIT) PO TABS
800.0000 ug | ORAL_TABLET | Freq: Every day | ORAL | Status: DC
  Administered 2023-03-26: 800 ug via ORAL
  Filled 2023-03-25: qty 1
  Filled 2023-03-25 (×2): qty 80

## 2023-03-25 MED ORDER — HYDRALAZINE HCL 25 MG PO TABS
100.0000 mg | ORAL_TABLET | Freq: Three times a day (TID) | ORAL | Status: DC
  Administered 2023-03-25 – 2023-03-26 (×2): 100 mg via ORAL
  Filled 2023-03-25 (×2): qty 4

## 2023-03-25 MED ORDER — TRAZODONE HCL 50 MG PO TABS: 25.0000 mg | ORAL_TABLET | Freq: Every evening | ORAL | Status: DC | PRN

## 2023-03-25 MED ORDER — MEMANTINE HCL ER 7 MG PO CP24
21.0000 mg | ORAL_CAPSULE | Freq: Every day | ORAL | Status: DC
  Filled 2023-03-25 (×2): qty 1

## 2023-03-25 MED ORDER — ONDANSETRON HCL 4 MG/2ML IJ SOLN: 4.0000 mg | Freq: Four times a day (QID) | INTRAMUSCULAR | Status: DC | PRN

## 2023-03-25 MED ORDER — ONDANSETRON HCL 4 MG PO TABS: 4.0000 mg | ORAL_TABLET | Freq: Four times a day (QID) | ORAL | Status: DC | PRN

## 2023-03-25 MED ORDER — CITALOPRAM HYDROBROMIDE 20 MG PO TABS
20.0000 mg | ORAL_TABLET | Freq: Every day | ORAL | Status: DC
  Administered 2023-03-26: 20 mg via ORAL
  Filled 2023-03-25: qty 1

## 2023-03-25 MED ORDER — AMLODIPINE BESYLATE 5 MG PO TABS
10.0000 mg | ORAL_TABLET | Freq: Every day | ORAL | Status: DC
  Administered 2023-03-26: 10 mg via ORAL
  Filled 2023-03-25: qty 2

## 2023-03-25 MED ORDER — DONEPEZIL HCL 5 MG PO TABS
10.0000 mg | ORAL_TABLET | Freq: Every day | ORAL | Status: DC
  Administered 2023-03-26: 10 mg via ORAL
  Filled 2023-03-25: qty 2

## 2023-03-25 MED ORDER — OCUVITE-LUTEIN PO CAPS
1.0000 | ORAL_CAPSULE | Freq: Every day | ORAL | Status: DC
  Administered 2023-03-26: 1 via ORAL
  Filled 2023-03-25: qty 1

## 2023-03-25 MED ORDER — SODIUM CHLORIDE 0.9 % IV SOLN: INTRAVENOUS | Status: DC

## 2023-03-25 MED ORDER — BICTEGRAVIR-EMTRICITAB-TENOFOV 50-200-25 MG PO TABS
1.0000 | ORAL_TABLET | Freq: Every day | ORAL | Status: DC
  Administered 2023-03-26: 1 via ORAL
  Filled 2023-03-25 (×2): qty 1

## 2023-03-25 MED ORDER — IRBESARTAN 75 MG PO TABS: 37.5000 mg | ORAL_TABLET | Freq: Every day | ORAL | Status: DC

## 2023-03-25 NOTE — Assessment & Plan Note (Signed)
-   We will continue Aricept, Namenda and Celexa.

## 2023-03-25 NOTE — H&P (Signed)
[place]   PATIENT NAME: Karen Luna    MR#:  [id number]  DATE OF BIRTH:  07/16/1931  DATE OF ADMISSION:  03/25/2023  PRIMARY CARE PHYSICIAN: [facility]   Patient is coming from: Home  REQUESTING/REFERRING PHYSICIAN:  Sabra Heck, PA-C  CHIEF COMPLAINT:   Chief Complaint  Patient presents with   Abdominal mass    HISTORY OF PRESENT ILLNESS:  Karen Luna is a 89 y.o. female with medical history significant for stage III chronic kidney disease, essential hypertension, anemia of chronic disease, and HIV, who presented to the emergency room with acute onset of right lower quadrant abdominal mass that was reported by her son though the patient stated that she has been having for a while without pain.  She denied any nausea or vomiting, diarrhea or constipation, or melena.  She has been noticing occasional blood when she wipes from her hemorrhoids.  No fever or chills.  No chest pain or palpitations.  No cough or wheezing or dyspnea.  No dysuria, oliguria or hematuria or flank pain.  No other bleeding diathesis.  ED Course: When she came to the ER, BP was 133/56 with heart rate of 57 with otherwise normal vital signs.  Labs revealed hyperkalemia of 5.8 and a BUN of 67 and creatinine 1.86 compared to 31 and 1.46.  CBC showed hemoglobin of 8.3 and hematocrit 26 from 9.4/29.7 on 12/10/2022.  Repeat H&H were 8.6 and 27.4.  UA showed rare bacteria with 30 protein large leukocytes with only 0-5 WBCs.  Stool Hemoccult came back negative urine culture was sent. EKG as reviewed by me : None Imaging: None.  The patient was given 1 L bolus of IV lactated Ringer.  She will be admitted to a medical telemetry bed for further evaluation and management. PAST MEDICAL HISTORY:   Past Medical History:  Diagnosis Date   Anemia of chronic disease 07/06/2011   Aortic atherosclerosis (HCC) 01/06/2015   Seen on CT scan, currently asymptomatic   Cancer (HCC)     uterine( per pt)   Chronic kidney disease, stage 3 (HCC) 09/01/2008   Chronic venous insufficiency 10/03/2012   Essential hypertension 09/19/2006   History of cervical cancer 08/11/2012   s/p hysterectomy, remote, specifics unknown    Human immunodeficiency virus (HIV) disease (HCC) 09/12/2006   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 07/25/2012   Low grade squamous intraepithelial lesion (LGSIL) on cervical Pap smear 08/06/2007   LGSIL: 08/06/2007, 11/05/2008, 05/11/2009 ASC-US: 05/27/2008, 08/31/2009, 05/24/2010, 05/23/2012 High Risk HPV: Detected    Onychomycosis of left great toe 09/09/2015   Overweight (BMI 25.0-29.9) 07/25/2012    PAST SURGICAL HISTORY:   Past Surgical History:  Procedure Laterality Date   ABDOMINAL HYSTERECTOMY      SOCIAL HISTORY:   Social History   Tobacco Use   Smoking status: Former    Current packs/day: 0.00    Types: Cigarettes    Quit date: 08/06/1988    Years since quitting: 34.6   Smokeless tobacco: Never  Substance Use Topics   Alcohol use: No    Alcohol/week: 0.0 standard drinks of alcohol    FAMILY HISTORY:   Family History  Problem Relation Age of Onset   Hypertension Mother    Heart disease Mother    Hypertension Father    Heart disease Father    Hypertension Sister    HIV/AIDS Daughter    Early death Sister  Gun shot wound   Cerebrovascular Accident Son     DRUG ALLERGIES:   Allergies  Allergen Reactions   Clonidine Derivatives Other (See Comments)    Non-specific heart block    REVIEW OF SYSTEMS:   ROS As per history of present illness. All pertinent systems were reviewed above. Constitutional, HEENT, cardiovascular, respiratory, GI, GU, musculoskeletal, neuro, psychiatric, endocrine, integumentary and hematologic systems were reviewed and are otherwise negative/unremarkable except for positive findings mentioned above in the HPI.   MEDICATIONS AT HOME:   Prior to Admission medications   Medication Sig  Start Date End Date Taking? Authorizing Provider  acetaminophen (TYLENOL) 650 MG CR tablet Take 650 mg by mouth 3 (three) times daily as needed for pain.   Yes [provider]  amLODipine (NORVASC) 10 MG tablet Take 10 mg by mouth daily.   Yes [provider]  aspirin EC 81 MG tablet Take 81 mg by mouth daily. Swallow whole.   Yes [provider]  bictegravir-emtricitabine-tenofovir AF (BIKTARVY) 50-200-25 MG TABS tablet Take 1 tablet by mouth daily. 06/26/22  Yes Comer, Belia Heman, MD  Cholecalciferol (VITAMIN D3) 20 MCG (800 UNIT) TABS Take 1 tablet by mouth daily.   Yes [provider]  citalopram (CELEXA) 20 MG tablet Take 20 mg by mouth daily.    Yes [provider]  Ferrous Sulfate (IRON PO) Take 45 mg by mouth daily.   Yes [provider]  fexofenadine (ALLEGRA) 60 MG tablet Take 60 mg by mouth 2 (two) times daily.   Yes [provider]  hydrALAZINE (APRESOLINE) 100 MG tablet Take 1 tablet (100 mg total) by mouth 3 (three) times daily. 09/09/15  Yes Doneen Poisson, MD  Memantine HCl-Donepezil HCl 21-10 MG CP24 Take 1 capsule by mouth daily.   Yes [provider]  Multiple Vitamins-Minerals (PRESERVISION AREDS 2) CAPS Take 1 capsule by mouth daily.   Yes [provider]  olmesartan (BENICAR) 40 MG tablet Take by mouth.   Yes [provider]      VITAL SIGNS:  Blood pressure (!) 154/57, pulse (!) 52, temperature 98.2 F (36.8 C), temperature source Oral, resp. rate 16, height 5\' 8"  (1.727 m), weight 51.3 kg, SpO2 97%.  PHYSICAL EXAMINATION:  Physical Exam  GENERAL:  89 y.o.-year-old African-American female patient lying in the bed with no acute distress.  EYES: Pupils equal, round, reactive to light and accommodation. No scleral icterus. Extraocular muscles intact.  HEENT: Head atraumatic, normocephalic. Oropharynx and nasopharynx clear.  NECK:  Supple, no jugular venous distention. No thyroid  enlargement, no tenderness.  LUNGS: Normal breath sounds bilaterally, no wheezing, rales,rhonchi or crepitation. No use of accessory muscles of respiration.  CARDIOVASCULAR: Regular rate and rhythm, S1, S2 normal. No murmurs, rubs, or gallops.  ABDOMEN: Soft, nondistended, nontender. Bowel sounds present. No organomegaly or mass.  EXTREMITIES: No pedal edema, cyanosis, or clubbing.  NEUROLOGIC: Cranial nerves II through XII are intact. Muscle strength 5/5 in all extremities. Sensation intact. Gait not checked.  PSYCHIATRIC: The patient is alert and oriented x 3.  Normal affect and good eye contact. SKIN: No obvious rash, lesion, or ulcer.   LABORATORY PANEL:   CBC Recent Labs  Lab 03/25/23 1723 03/25/23 2231  WBC 5.2  --   HGB 8.3* 8.6*  HCT 26.0* 27.4*  PLT 319  --    ------------------------------------------------------------------------------------------------------------------  Chemistries  Recent Labs  Lab 03/25/23 1723  NA 142  K 5.8*  CL 110  CO2 23  GLUCOSE  101*  BUN 67*  CREATININE 1.86*  CALCIUM 9.8  AST 17  ALT 15  ALKPHOS 75  BILITOT 0.6   ------------------------------------------------------------------------------------------------------------------  Cardiac Enzymes No results for input(s): "TROPONINI" in the last 168 hours. ------------------------------------------------------------------------------------------------------------------  RADIOLOGY:  No results found.    IMPRESSION AND PLAN:  Assessment and Plan: * Acute kidney injury superimposed on CKD (HCC) - This is superimposed on stage IIIb chronic kidney disease. - She will be admitted to a medical telemetry bed. - We will continue hydration with IV normal saline. - We will avoid nephrotoxins. - Will follow BMPs.  Hyperkalemia - This likely secondary to #1. - Management as above. - We will hold off ARB therapy.  Rectal bleeding - This likely secondary to her hemorrhoids. - H&H  are currently stable. - She may benefit from a GI referral upon discharge or if this is recurrent while she is here.  Essential hypertension - We will continue hydralazine and amlodipine while holding off ARB therapy given hyperkalemia and AKI.  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       DVT prophylaxis: SCDs. Advanced Care Planning:  Code Status: full code.  Family Communication:  The plan of care was discussed in details with the patient (and family). I answered all questions. The patient agreed to proceed with the above mentioned plan. Further management will depend upon hospital course. Disposition Plan: Back to previous home environment Consults called: none.  All the records are reviewed and case discussed with ED provider.  Status is: Inpatient   At the time of the admission, it appears that the appropriate admission status for this patient is inpatient.  This is judged to be reasonable and necessary in order to provide the required intensity of service to ensure the patient's safety given the presenting symptoms, physical exam findings and initial radiographic and laboratory data in the context of comorbid conditions.  The patient requires inpatient status due to high intensity of service, high risk of further deterioration and high frequency of surveillance required.  I certify that at the time of admission, it is my clinical judgment that the patient will require inpatient hospital care extending more than 2 midnights.                            Dispo: The patient is from: Home              Anticipated d/c is to: Home              Patient currently is not medically stable to d/c.              Difficult to place patient: No  Hannah Beat M.D on 03/25/2023 at 11:57 PM  [facility]   From 7 PM-7 AM, contact night-coverage [email or web address]  CC: Primary care physician; [facility] [employer] [facility] [employer] [facility]

## 2023-03-25 NOTE — ED Notes (Signed)
Pt ambulated to bedside in room with minimal assist.

## 2023-03-25 NOTE — Assessment & Plan Note (Signed)
-   We will continue hydralazine and amlodipine while holding off ARB therapy given hyperkalemia and AKI.

## 2023-03-25 NOTE — Assessment & Plan Note (Signed)
-   This likely secondary to her hemorrhoids. - H&H are currently stable. - She may benefit from a GI referral upon discharge or if this is recurrent while she is here.

## 2023-03-25 NOTE — ED Triage Notes (Signed)
Pt family noticed a large mass on her right lower abdomen Saturday afternoon and pt and family state that it is getting bigger now. Pt has a home health nurse that comes out and helps her bath and the nurse reported that the patient had rectal bleeding. Pt denies pain.

## 2023-03-25 NOTE — Assessment & Plan Note (Signed)
-   This likely secondary to #1. - Management as above. - We will hold off ARB therapy.

## 2023-03-25 NOTE — Assessment & Plan Note (Signed)
-   This is superimposed on stage IIIb chronic kidney disease. - She will be admitted to a medical telemetry bed. - We will continue hydration with IV normal saline. - We will avoid nephrotoxins. - Will follow BMPs.

## 2023-03-25 NOTE — ED Provider Notes (Signed)
[place] EMERGENCY DEPARTMENT AT [facility] Provider Note   CSN: [id number] Arrival date & time: 03/25/23  1544     History  Chief Complaint  Patient presents with   Abdominal mass   Karen Luna is a 89 y.o. female with past medical history of HIV, anemia of chronic disease, hypertension, LVH, dementia, CKD, TIA (09/26/2022) presents to emergency department for evaluation of rectal bleeding and abdominal mass that daughter noticed today while visiting her at rehab.  Daughter reports that the caregiver was taking her to the bathroom when she noted drops of bright red blood coming from rectum presumably. She also noticed an abdominal mass in RLQ of abdomen.  Patient is not a reliable historian due to dementia.  She has no complaints nor complaints of pain.  HPI     Home Medications Prior to Admission medications   Medication Sig Start Date End Date Taking? Authorizing Provider  acetaminophen (TYLENOL) 650 MG CR tablet Take 650 mg by mouth 3 (three) times daily as needed for pain.   Yes [provider]  amLODipine (NORVASC) 10 MG tablet Take 10 mg by mouth daily.   Yes [provider]  aspirin EC 81 MG tablet Take 81 mg by mouth daily. Swallow whole.   Yes [provider]  bictegravir-emtricitabine-tenofovir AF (BIKTARVY) 50-200-25 MG TABS tablet Take 1 tablet by mouth daily. 06/26/22  Yes Comer, Belia Heman, MD  Cholecalciferol (VITAMIN D3) 20 MCG (800 UNIT) TABS Take 1 tablet by mouth daily.   Yes [provider]  citalopram (CELEXA) 20 MG tablet Take 20 mg by mouth daily.    Yes [provider]  Ferrous Sulfate (IRON PO) Take 45 mg by mouth daily.   Yes [provider]  fexofenadine (ALLEGRA) 60 MG tablet Take 60 mg by mouth 2 (two) times daily.   Yes [provider]  hydrALAZINE (APRESOLINE) 100 MG tablet Take 1 tablet (100 mg total) by mouth 3 (three) times daily. 09/09/15  Yes Doneen Poisson, MD   Memantine HCl-Donepezil HCl 21-10 MG CP24 Take 1 capsule by mouth daily.   Yes [provider]  Multiple Vitamins-Minerals (PRESERVISION AREDS 2) CAPS Take 1 capsule by mouth daily.   Yes [provider]  olmesartan (BENICAR) 40 MG tablet Take by mouth.   Yes [provider]      Allergies    Clonidine derivatives    Review of Systems   Review of Systems  Constitutional:  Negative for chills, fatigue and fever.  Respiratory:  Negative for cough, chest tightness, shortness of breath and wheezing.   Cardiovascular:  Negative for chest pain and palpitations.  Gastrointestinal:  Positive for anal bleeding. Negative for abdominal pain, constipation, diarrhea, nausea, rectal pain and vomiting.       Abd mass RLQ  Neurological:  Negative for dizziness, seizures, weakness, light-headedness, numbness and headaches.    Physical Exam Updated Vital Signs BP (!) 133/56   Pulse (!) 57   Temp 98.2 F (36.8 C) (Oral)   Resp 16   Ht 5\' 8"  (1.727 m)   Wt 51.3 kg   SpO2 99%   BMI 17.18 kg/m  Physical Exam Vitals and nursing note reviewed. Exam conducted with a chaperone present.  Constitutional:      General: She is not in acute distress.    Appearance: Normal appearance.  HENT:     Head: Normocephalic and atraumatic.  Eyes:     Conjunctiva/sclera: Conjunctivae normal.  Cardiovascular:  Rate and Rhythm: Normal rate.  Pulmonary:     Effort: Pulmonary effort is normal. No respiratory distress.     Breath sounds: Normal breath sounds.  Abdominal:     General: Bowel sounds are normal. There is no distension.     Palpations: Abdomen is soft.     Tenderness: There is no abdominal tenderness. There is no guarding or rebound.     Hernia: A hernia (RLQ reocurring hernia that is easily reducible) is present.  Genitourinary:    Rectum: Guaiac result negative.     Comments: External hemorrhoids with no gross BRBPR Musculoskeletal:     Right lower leg: No edema.      Left lower leg: No edema.  Skin:    Capillary Refill: Capillary refill takes less than 2 seconds.     Coloration: Skin is not jaundiced or pale.     Comments: No erythema, ecchymosis over hernia or to abdomen  Neurological:     Mental Status: She is alert. Mental status is at baseline.     Comments: A&Ox2 per baseline d/t dementia (per daughter at bedside)   Rosanna Randy RN present during GU exam ED Results / Procedures / Treatments   Labs (all labs ordered are listed, but only abnormal results are displayed) Labs Reviewed  COMPREHENSIVE METABOLIC PANEL - Abnormal; Notable for the following components:      Result Value   Potassium 5.8 (*)    Glucose, Bld 101 (*)    BUN 67 (*)    Creatinine, Ser 1.86 (*)    GFR, Estimated 26 (*)    All other components within normal limits  CBC WITH DIFFERENTIAL/PLATELET - Abnormal; Notable for the following components:   RBC 2.25 (*)    Hemoglobin 8.3 (*)    HCT 26.0 (*)    MCV 115.6 (*)    MCH 36.9 (*)    All other components within normal limits  URINALYSIS, ROUTINE W REFLEX MICROSCOPIC  OCCULT BLOOD X 1 CARD TO LAB, STOOL  POC OCCULT BLOOD, ED    EKG None  Radiology No results found.  Procedures Procedures    Medications Ordered in ED Medications  lactated ringers bolus 1,000 mL (has no administration in time range)    ED Course/ Medical Decision Making/ A&P                                 Medical Decision Making Amount and/or Complexity of Data Reviewed Labs: ordered.  Risk Decision regarding hospitalization.   Patient presents to the ED for concern of rectal bleeding and RLQ abdominal mass, this involves an extensive number of treatment options, and is a complaint that carries with it a high risk of complications and morbidity.  The differential diagnosis includes GIB, hemorrhoids, hernia, neoplasm, intraabdominal pathology. This is not an exhaustive list   Co morbidities that complicate the patient  evaluation  HIV, anemia of chronic disease, hypertension, LVH, dementia, CKD, TIA    Additional history obtained:  Additional history obtained from Family, Nursing, Outside Medical Records, and Past Admission   External records from outside source obtained and reviewed including RN triage note   Lab Tests:  I Ordered, and personally interpreted labs.  The pertinent results include:  anemia 8.3 (decreased from baseline 9.1-9.7), hyperkalemia (5.8), BUN 67 (elevated from baseline 26-31), elevated creatinine 1.86 (elevated from baseline 1.33-1.68)  Tests Considered Abdominal US for RLQ suspected hernia. However, no [place]  available on site.   Medicines ordered and prescription drug management:  Dr. Suezanne Jacquet ordered medication including LR  for AKI  Reevaluation of the patient after these medicines showed that the patient stayed the same I have reviewed the patients home medicines and have made adjustments as needed   Consultations Obtained:  I requested consultation with the hospitalist (Dr. Arville Care),  and discussed lab and imaging findings as well as pertinent plan - they accept patient for admission   Problem List / ED Course:  Elevated BUN Baseline 26-31. Today 67. Rectal bleeding No gross blood noted on GU exam.  However, there was dark stool noted in her diaper.  Patient's daughter reports that patient does not normally wear a diaper and is normally continent.  Upon questioning patient, patient states that she defecated in diaper intentionally but is questionable due to patient's dementia. Fecal occult in ED neg but Hgb 8.3 when compared to 3 months ago (baseline 9.1-9.7) and with elevated BUN with reports of rectal bleeding, I have concern regarding possible GIB. Hyperkalemia Today 5.8 and baseline WNL AKI Baseline creatinine 1.03-1.79 but most recently 1.46 three months ago. Currently 1.86 Will provide LR bolus  Reevaluation:  After the interventions noted above, I  reevaluated the patient and found that they have :improved   Dispostion:  After consideration of the diagnostic results and the patients response to treatment, I feel that the patent would benefit from admission for observation of labs in setting of possible rectal bleeding in setting of elevated BUN as well as monitoring hyperkalemia and new worsening creatinine.         Final Clinical Impression(s) / ED Diagnoses Final diagnoses:  Hernia of abdominal cavity  AKI (acute kidney injury) (HCC)  Elevated BUN  Hyperkalemia  Rectal bleeding    Rx / DC Orders ED Discharge Orders     None         Judithann Sheen, PA 03/25/23 2148    Lonell Grandchild, MD 03/25/23 2308

## 2023-03-26 DIAGNOSIS — N189 Chronic kidney disease, unspecified: Secondary | ICD-10-CM | POA: Diagnosis not present

## 2023-03-26 DIAGNOSIS — N179 Acute kidney failure, unspecified: Secondary | ICD-10-CM | POA: Diagnosis not present

## 2023-03-26 DIAGNOSIS — N170 Acute kidney failure with tubular necrosis: Secondary | ICD-10-CM | POA: Diagnosis present

## 2023-03-26 LAB — CBC
HCT: 25.5 % — ABNORMAL LOW (ref 36.0–46.0)
Hemoglobin: 8.3 g/dL — ABNORMAL LOW (ref 12.0–15.0)
MCH: 36.7 pg — ABNORMAL HIGH (ref 26.0–34.0)
MCHC: 32.5 g/dL (ref 30.0–36.0)
MCV: 112.8 fL — ABNORMAL HIGH (ref 80.0–100.0)
Platelets: 309 10*3/uL (ref 150–400)
RBC: 2.26 MIL/uL — ABNORMAL LOW (ref 3.87–5.11)
RDW: 13.7 % (ref 11.5–15.5)
WBC: 5.6 10*3/uL (ref 4.0–10.5)
nRBC: 0 % (ref 0.0–0.2)

## 2023-03-26 LAB — BASIC METABOLIC PANEL
Anion gap: 10 (ref 5–15)
BUN: 50 mg/dL — ABNORMAL HIGH (ref 8–23)
CO2: 24 mmol/L (ref 22–32)
Calcium: 9.8 mg/dL (ref 8.9–10.3)
Chloride: 106 mmol/L (ref 98–111)
Creatinine, Ser: 1.57 mg/dL — ABNORMAL HIGH (ref 0.44–1.00)
GFR, Estimated: 31 mL/min — ABNORMAL LOW (ref >60)
Glucose, Bld: 79 mg/dL (ref 70–99)
Potassium: 5 mmol/L (ref 3.5–5.1)
Sodium: 140 mmol/L (ref 135–145)

## 2023-03-26 MED ORDER — MEMANTINE HCL ER 7 MG PO CP24
21.0000 mg | ORAL_CAPSULE | Freq: Every day | ORAL | Status: DC
  Administered 2023-03-26: 21 mg via ORAL
  Filled 2023-03-26 (×2): qty 3

## 2023-03-26 MED ORDER — DOXAZOSIN MESYLATE 2 MG PO TABS: 2.0000 mg | ORAL_TABLET | Freq: Every day | ORAL | 11 refills | Status: DC

## 2023-03-26 NOTE — Progress Notes (Signed)
Nsg Discharge Note  Admit Date:  03/25/2023 Discharge date: 03/26/2023   Karen Luna to be D/C'd Home per MD order.  AVS completed.  Copy for chart, and copy for patient signed, and dated. Patient/caregiver able to verbalize understanding.  Discharge Medication: Allergies as of 03/26/2023       Reactions   Clonidine Derivatives Other (See Comments)   Non-specific heart block        Medication List     STOP taking these medications    olmesartan 40 MG tablet Commonly known as: BENICAR       TAKE these medications    acetaminophen 650 MG CR tablet Commonly known as: TYLENOL Take 650 mg by mouth 3 (three) times daily as needed for pain.   amLODipine 10 MG tablet Commonly known as: NORVASC Take 10 mg by mouth daily.   aspirin EC 81 MG tablet Take 81 mg by mouth daily. Swallow whole.   bictegravir-emtricitabine-tenofovir AF 50-200-25 MG Tabs tablet Commonly known as: BIKTARVY Take 1 tablet by mouth daily.   citalopram 20 MG tablet Commonly known as: CELEXA Take 20 mg by mouth daily.   doxazosin 2 MG tablet Commonly known as: Cardura Take 1 tablet (2 mg total) by mouth daily.   fexofenadine 60 MG tablet Commonly known as: ALLEGRA Take 60 mg by mouth 2 (two) times daily.   hydrALAZINE 100 MG tablet Commonly known as: APRESOLINE Take 1 tablet (100 mg total) by mouth 3 (three) times daily.   IRON PO Take 45 mg by mouth daily.   Memantine HCl-Donepezil HCl 21-10 MG Cp24 Take 1 capsule by mouth daily.   PreserVision AREDS 2 Caps Take 1 capsule by mouth daily.   Vitamin D3 20 MCG (800 UNIT) Tabs Take 1 tablet by mouth daily.        Discharge Assessment: Vitals:   03/26/23 0410 03/26/23 1310  BP: (!) 149/54 (!) 135/50  Pulse: (!) 46 (!) 52  Resp: 18 17  Temp: 97.7 F (36.5 C) 97.6 F (36.4 C)  SpO2: 97% 98%   Skin clean, dry and intact without evidence of skin break down, no evidence of skin tears noted. IV catheter discontinued intact.  Site without signs and symptoms of complications - no redness or edema noted at insertion site, patient denies c/o pain - only slight tenderness at site.  Dressing with slight pressure applied.  D/c Instructions-Education: Discharge instructions given to patient/family with verbalized understanding. D/c education completed with patient/family including follow up instructions, medication list, d/c activities limitations if indicated, with other d/c instructions as indicated by MD - patient able to verbalize understanding, all questions fully answered. Patient instructed to return to ED, call 911, or call MD for any changes in condition.  Patient escorted via WC, and D/C home via private auto.  Demetrio Lapping, LPN 02/24/2535 6:44 PM

## 2023-03-26 NOTE — ED Notes (Signed)
ED TO INPATIENT HANDOFF REPORT  ED Nurse Name and Phone #: Jess F   S Name/Age/Gender Karen Luna 89 y.o. female Room/Bed: APA10/APA10  Code Status   Code Status: Full Code  Home/SNF/Other Home Patient oriented to: self and place Is this baseline? Yes   Triage Complete: Triage complete  Chief Complaint Acute kidney injury superimposed on CKD (HCC) [N17.9, N18.9]  Triage Note Pt family noticed a large mass on her right lower abdomen Saturday afternoon and pt and family state that it is getting bigger now. Pt has a home health nurse that comes out and helps her bath and the nurse reported that the patient had rectal bleeding. Pt denies pain.    Allergies Allergies  Allergen Reactions   Clonidine Derivatives Other (See Comments)    Non-specific heart block    Level of Care/Admitting Diagnosis ED Disposition     ED Disposition  Admit   Condition  --   Comment  Hospital Area: [facility] [[id number]]  Level of Care: Telemetry [5]  Covid Evaluation: Asymptomatic - no recent exposure (last 10 days) testing not required  Diagnosis: Acute kidney injury superimposed on CKD [facility]) [[id number]]  Admitting Physician: Hannah Beat [[id number]]  Attending Physician: Hannah Beat [[id number]]  Certification:: I certify this patient will need inpatient services for at least 2 midnights  Expected Medical Readiness: 03/27/2023          B Medical/Surgery History Past Medical History:  Diagnosis Date   Anemia of chronic disease 07/06/2011   Aortic atherosclerosis (HCC) 01/06/2015   Seen on CT scan, currently asymptomatic   Cancer (HCC)    uterine( per pt)   Chronic kidney disease, stage 3 (HCC) 09/01/2008   Chronic venous insufficiency 10/03/2012   Essential hypertension 09/19/2006   History of cervical cancer 08/11/2012   s/p hysterectomy, remote, specifics unknown    Human immunodeficiency virus (HIV) disease (HCC) 09/12/2006   Left ventricular hypertrophy due to hypertensive  disease 07/25/2012   Low grade squamous intraepithelial lesion (LGSIL) on cervical Pap smear 08/06/2007   LGSIL: 08/06/2007, 11/05/2008, 05/11/2009 ASC-US: 05/27/2008, 08/31/2009, 05/24/2010, 05/23/2012 High Risk HPV: Detected    Onychomycosis of left great toe 09/09/2015   Overweight (BMI 25.0-29.9) 07/25/2012   Past Surgical History:  Procedure Laterality Date   ABDOMINAL HYSTERECTOMY       A IV Location/Drains/Wounds Patient Lines/Drains/Airways Status     Active Line/Drains/Airways     Name Placement date Placement time Site Days   Peripheral IV 03/25/23 22 G Anterior;Left Forearm 03/25/23  1858  Forearm  1            Intake/Output Last 24 hours  Intake/Output Summary (Last 24 hours) at 03/26/2023 0020 Last data filed at 03/25/2023 2033 Gross per 24 hour  Intake 1000 ml  Output --  Net 1000 ml    Labs/Imaging Results for orders placed or performed during the hospital encounter of 03/25/23 (from the past 48 hour(s))  Comprehensive metabolic panel     Status: Abnormal   Collection Time: 03/25/23  5:23 PM  Result Value Ref Range   Sodium 142 135 - 145 mmol/L   Potassium 5.8 (H) 3.5 - 5.1 mmol/L   Chloride 110 98 - 111 mmol/L   CO2 23 22 - 32 mmol/L   Glucose, Bld 101 (H) 70 - 99 mg/dL    Comment: Glucose reference range applies only to samples taken after fasting for at least 8 hours.   BUN 67 (H) 8 - 23  mg/dL   Creatinine, Ser 7.82 (H) 0.44 - 1.00 mg/dL   Calcium 9.8 8.9 - 95.6 mg/dL   Total Protein 7.7 6.5 - 8.1 g/dL   Albumin 4.0 3.5 - 5.0 g/dL   AST 17 15 - 41 U/L   ALT 15 0 - 44 U/L   Alkaline Phosphatase 75 38 - 126 U/L   Total Bilirubin 0.6 <1.2 mg/dL   GFR, Estimated 26 (L) >60 mL/min    Comment: (NOTE) Calculated using the CKD-EPI Creatinine Equation (2021)    Anion gap 9 5 - 15    Comment: Performed at [facility], [address] [place], [place] [address]  CBC with Differential     Status: Abnormal   Collection Time: 03/25/23  5:23 PM  Result Value Ref  Range   WBC 5.2 4.0 - 10.5 K/uL   RBC 2.25 (L) 3.87 - 5.11 MIL/uL   Hemoglobin 8.3 (L) 12.0 - 15.0 g/dL   HCT 65.7 (L) 84.6 - 96.2 %   MCV 115.6 (H) 80.0 - 100.0 fL   MCH 36.9 (H) 26.0 - 34.0 pg   MCHC 31.9 30.0 - 36.0 g/dL   RDW 95.2 84.1 - 32.4 %   Platelets 319 150 - 400 K/uL   nRBC 0.0 0.0 - 0.2 %   Neutrophils Relative % 59 %   Neutro Abs 3.1 1.7 - 7.7 K/uL   Lymphocytes Relative 22 %   Lymphs Abs 1.1 0.7 - 4.0 K/uL   Monocytes Relative 16 %   Monocytes Absolute 0.8 0.1 - 1.0 K/uL   Eosinophils Relative 2 %   Eosinophils Absolute 0.1 0.0 - 0.5 K/uL   Basophils Relative 1 %   Basophils Absolute 0.1 0.0 - 0.1 K/uL   WBC Morphology MORPHOLOGY UNREMARKABLE    RBC Morphology See Note     Comment: MACROCYTOSIS PRESENT   Smear Review MORPHOLOGY UNREMARKABLE    Immature Granulocytes 0 %   Abs Immature Granulocytes 0.01 0.00 - 0.07 K/uL    Comment: Performed at [facility], [address] [place], [place] [address]  POC occult blood, ED     Status: None   Collection Time: 03/25/23  6:24 PM  Result Value Ref Range   Fecal Occult Bld NEGATIVE NEGATIVE  Urinalysis, Routine w reflex microscopic -Urine, Clean Catch     Status: Abnormal   Collection Time: 03/25/23  8:43 PM  Result Value Ref Range   Color, Urine YELLOW YELLOW   APPearance HAZY (A) CLEAR   Specific Gravity, Urine 1.017 1.005 - 1.030   pH 7.0 5.0 - 8.0   Glucose, UA NEGATIVE NEGATIVE mg/dL   Hgb urine dipstick NEGATIVE NEGATIVE   Bilirubin Urine NEGATIVE NEGATIVE   Ketones, ur NEGATIVE NEGATIVE mg/dL   Protein, ur 30 (A) NEGATIVE mg/dL   Nitrite NEGATIVE NEGATIVE   Leukocytes,Ua LARGE (A) NEGATIVE   RBC / HPF 0-5 0 - 5 RBC/hpf   WBC, UA 0-5 0 - 5 WBC/hpf   Bacteria, UA RARE (A) NONE SEEN   Squamous Epithelial / HPF 0-5 0 - 5 /HPF    Comment: Performed at [facility], [address] [place], [place] [address]  Hemoglobin and hematocrit, blood     Status: Abnormal   Collection Time: 03/25/23 10:31 PM  Result  Value Ref Range   Hemoglobin 8.6 (L) 12.0 - 15.0 g/dL   HCT 64.4 (L) 03.4 - 74.2 %    Comment: Performed at [facility], [address], [place], [place] [address]  No results found.  Pending Labs Unresulted Labs (From admission, onward)     Start     Ordered   03/26/23 0500  Basic metabolic panel  Tomorrow morning,   R        03/25/23 2216   03/26/23 0500  CBC  Tomorrow morning,   R        03/25/23 2216   03/25/23 2217  Hemoglobin and hematocrit, blood  Now then every 6 hours,   R (with TIMED occurrences)      03/25/23 2216   03/25/23 2102  Urine Culture  Add-on,   AD       Question:  Indication  Answer:  Altered mental status (if no other cause identified)   03/25/23 2101   03/25/23 1749  Occult blood card to lab, stool  ONCE - STAT,   URGENT        03/25/23 1748            Vitals/Pain Today's Vitals   03/25/23 1925 03/25/23 1930 03/25/23 2230 03/25/23 2259  BP:  (!) 163/57 (!) 154/57   Pulse:  (!) 53 (!) 52   Resp:  16    Temp:      TempSrc:      SpO2:  97% 97%   Weight:      Height:      PainSc: 0-No pain   0-No pain    Isolation Precautions No active isolations  Medications Medications  bictegravir-emtricitabine-tenofovir AF (BIKTARVY) 50-200-25 MG per tablet 1 tablet (has no administration in time range)  amLODipine (NORVASC) tablet 10 mg (has no administration in time range)  hydrALAZINE (APRESOLINE) tablet 100 mg (100 mg Oral Given 03/25/23 2255)  citalopram (CELEXA) tablet 20 mg (has no administration in time range)  Ferrous Fumarate (HEMOCYTE - 106 mg FE) tablet 106 mg of iron (has no administration in time range)  cholecalciferol (VITAMIN D3) 10 MCG (400 UNIT) tablet 800 mcg (has no administration in time range)  multivitamin-lutein (OCUVITE-LUTEIN) capsule 1 capsule (has no administration in time range)  loratadine (CLARITIN) tablet 10 mg (has no administration in time range)  0.9 %  sodium chloride infusion ( Intravenous New Bag/Given 03/25/23  2254)  acetaminophen (TYLENOL) tablet 650 mg (has no administration in time range)    Or  acetaminophen (TYLENOL) suppository 650 mg (has no administration in time range)  traZODone (DESYREL) tablet 25 mg (has no administration in time range)  ondansetron (ZOFRAN) tablet 4 mg (has no administration in time range)    Or  ondansetron (ZOFRAN) injection 4 mg (has no administration in time range)  memantine (NAMENDA XR) 24 hr capsule 21 mg (has no administration in time range)    And  donepezil (ARICEPT) tablet 10 mg (has no administration in time range)  lactated ringers bolus 1,000 mL (0 mLs Intravenous Stopped 03/25/23 2033)    Mobility walks     Focused Assessments Abdominal    R Recommendations: See Admitting Provider Note  Report given to:   Additional Notes: n/a

## 2023-03-26 NOTE — Evaluation (Signed)
Physical Therapy Evaluation Patient Details Name: NYASHIA HIRSCHY MRN: [id number] DOB: 22-Dec-1931 Today's Date: 03/26/2023  History of Present Illness  Karen Luna is a 89 y.o. female with medical history significant for stage III chronic kidney disease, essential hypertension, anemia of chronic disease, and HIV, who presented to the emergency room with acute onset of right lower quadrant abdominal mass that was reported by her son though the patient stated that she has been having for a while without pain.  She denied any nausea or vomiting, diarrhea or constipation, or melena.  She has been noticing occasional blood when she wipes from her hemorrhoids.  No fever or chills.  No chest pain or palpitations.  No cough or wheezing or dyspnea.  No dysuria, oliguria or hematuria or flank pain.  No other bleeding diathesis.   Clinical Impression  Patient functioning near baseline for functional mobility other than limited arm swing with fair/poor carryover for letting arms relax while walking, but able to ambulate in room, hallways without loss of balance and no need for an AD.  Plan:  Patient discharged from physical therapy to care of nursing for ambulation daily as tolerated for length of stay.          If plan is discharge home, recommend the following: Help with stairs or ramp for entrance   Can travel by private vehicle        Equipment Recommendations None recommended by PT  Recommendations for Other Services       Functional Status Assessment Patient has not had a recent decline in their functional status     Precautions / Restrictions Precautions Precautions: None Restrictions Weight Bearing Restrictions: No      Mobility  Bed Mobility Overal bed mobility: Modified Independent                  Transfers Overall transfer level: Modified independent                      Ambulation/Gait Ambulation/Gait assistance: Modified independent  (Device/Increase time) Gait Distance (Feet): 200 Feet Assistive device: None Gait Pattern/deviations: WFL(Within Functional Limits) Gait velocity: slightly decreased     General Gait Details: grossly WFL other than limited arm swing with good return for ambulating in room, hallways without loss of balance  Stairs            Wheelchair Mobility     Tilt Bed    Modified Rankin (Stroke Patients Only)       Balance Overall balance assessment: Mild deficits observed, not formally tested                                           Pertinent Vitals/Pain Pain Assessment Pain Assessment: No/denies pain    Home Living Family/patient expects to be discharged to:: Private residence Living Arrangements: Alone Available Help at Discharge: Family;Available PRN/intermittently Type of Home: Apartment Home Access: Stairs to enter Entrance Stairs-Rails: Right;Left;Can reach both Entrance Stairs-Number of Steps: 3   Home Layout: One level Home Equipment: Grab bars - tub/shower      Prior Function Prior Level of Function : Needs assist       Physical Assist : Mobility (physical);ADLs (physical) Mobility (physical): Bed mobility;Transfers;Gait;Stairs   Mobility Comments: Household and short distanced community ambulation without AD ADLs Comments: Assisted by family, pace program 4 hours/day on MWF, home aides  2 hours/day on Tuesday, Thursday     Extremity/Trunk Assessment   Upper Extremity Assessment Upper Extremity Assessment: Overall WFL for tasks assessed    Lower Extremity Assessment Lower Extremity Assessment: Overall WFL for tasks assessed    Cervical / Trunk Assessment Cervical / Trunk Assessment: Normal  Communication   Communication Communication: No apparent difficulties  Cognition Arousal: Alert Behavior During Therapy: WFL for tasks assessed/performed Overall Cognitive Status: Within Functional Limits for tasks assessed                                           General Comments      Exercises     Assessment/Plan    PT Assessment Patient does not need any further PT services  PT Problem List         PT Treatment Interventions      PT Goals (Current goals can be found in the Care Plan section)  Acute Rehab PT Goals Patient Stated Goal: return home with family to assist PT Goal Formulation: With patient/family Time For Goal Achievement: 03/26/23 Potential to Achieve Goals: Good    Frequency       Co-evaluation               AM-PAC PT "6 Clicks" Mobility  Outcome Measure Help needed turning from your back to your side while in a flat bed without using bedrails?: None Help needed moving from lying on your back to sitting on the side of a flat bed without using bedrails?: None Help needed moving to and from a bed to a chair (including a wheelchair)?: None Help needed standing up from a chair using your arms (e.g., wheelchair or bedside chair)?: None Help needed to walk in hospital room?: None Help needed climbing 3-5 steps with a railing? : A Little 6 Click Score: 23    End of Session   Activity Tolerance: Patient tolerated treatment well;Patient limited by fatigue Patient left: in bed;with call bell/phone within reach;with family/visitor present Nurse Communication: Mobility status PT Visit Diagnosis: Unsteadiness on feet (R26.81);Other abnormalities of gait and mobility (R26.89);Repeated falls (R29.6)    Time: 1610-9604 PT Time Calculation (min) (ACUTE ONLY): 20 min   Charges:   PT Evaluation $PT Eval Moderate Complexity: 1 Mod PT Treatments $Therapeutic Activity: 8-22 mins PT General Charges $$ ACUTE PT VISIT: 1 Visit         3:39 PM, 03/26/23 Ocie Bob, MPT Physical Therapist with [facility] [phone] office [phone] mobile phone

## 2023-03-26 NOTE — Progress Notes (Signed)
This RN reviewed admission questions with Son (william dalton) and patient. Pt is alert and oriented to self only. She is disoriented to time, situation, and age. Per her son, she lives alone at home and an aide with [facility] comes daily to help with ADL's until son comes over. She denies pain currently. Son helps her with her groceries, medications and appts. Wardell Heath Gerrianne Scale

## 2023-03-26 NOTE — Progress Notes (Signed)
   03/26/23 1000  TOC Brief Assessment  Insurance and Status Reviewed  Patient has primary care physician Yes  Home environment has been reviewed from home with family  Prior level of function: family assists as needed  Prior/Current Home Services Current home services ([facility] services/RN)  Readmission risk has been reviewed Yes  Transition of care needs no transition of care needs at this time    Transition of Care Department [facility]) has reviewed patient and no TOC needs have been identified at this time. We will continue to monitor patient advancement through interdisciplinary progression rounds. If new patient transition needs arise, please place a TOC consult.

## 2023-03-26 NOTE — Progress Notes (Signed)
0000-pt arrived via stretcher A&Ox2, no pain 0/10, transfer from stretcher to the bed by self, gait and balance WNL.  0600-pt on 75 mlhr NS uneventful evening

## 2023-03-26 NOTE — Discharge Summary (Signed)
Physician Discharge Summary  Karen Luna ION:[id number] DOB: [birth year] DOA: 03/25/2023  [facility]  Admit date: 03/25/2023 Discharge date: 03/26/2023  Admitted From: Home Disposition: Home  Recommendations for Outpatient Follow-up:  Follow up with PCP in 1-2 weeks Please obtain BMP/CBC in one week Will send referral to gastroenterology and surgery for outpatient follow-up  Home Health: N/A Equipment/Devices: N/A  Discharge Condition: Stable CODE STATUS: Full code Diet recommendation: Regular diet, avoid constipation  Discharge summary: 89 year old with history of CKD stage IIIb, essential hypertension, anemia of chronic disease with baseline hemoglobin about 8-9, history of HIV on antiretrovirals presents to the emergency room as home care nurse noticed few drops of blood dribbling from her rectum while giving her shower.  Family also noticed a large hernia on her right lower quadrant.  Patient does have history of hemorrhoids and intermittent bleeding for many years.  Patient herself denied any complaints in the ER.  In the emergency room creatinine 1.86 compared to 1.46 historically.  Hemoglobin 8.3, recent hemoglobin at the range of 9.  Patient was monitored in the hospital and treated for AKI on CKD stage IIIb, suspected outlet rectal bleeding, chronic abdominal wall hernia.  AKI on CKD stage IIIb, hypertensive kidney disease: Treated with IV fluids overnight.  Currently maintaining on oral intake.  Asymptomatic.  Kidney functions trended down to her usual numbers.  Outpatient follow-up.  Painless rectal bleeding: Hemoccult negative.  No evidence of bleeding on rectal exam.  Likely intermittent bleeding from internal hemorrhoids/fissures.  Since she is not currently bleeding, will send her for GI follow-up.  Will need repeat hemoglobin in 1 week.  She did not have any significant drop in hemoglobin even after hemodilution.  Large  abdominal wall hernia: Patient does have right lower quadrant abdominal wall hernia that is nontender, easily reducible.  Present for a long time.  This is with large opening,.  Less likely to get into complication.  Discussed this with the family.  Will send referral to surgery for outpatient follow-up if she is amenable to repair.  Essential hypertension: Blood pressure is stable.  On amlodipine 10 mg daily, on hydralazine 100 mg 3 times daily and on olmesartan 40 at home.  Presented with potassium of 5.8.  Will discontinue olmesartan.  Will add Cardura 2 mg.  Will continue to need monitoring and further adjustment of medications.  Her heart rate usually remains low so not starting on any beta-blockers.   Patient mobilized in the hallway.  Asymptomatic.  Daughter-in-law at the bedside.  Eager to go home.   Discharge Diagnoses:  Principal Problem:   Acute kidney injury superimposed on CKD [name]) Active Problems:   Hyperkalemia   Rectal bleeding   Essential hypertension   Dementia in Alzheimer disease with depression [name])    Discharge Instructions  Discharge Instructions     Ambulatory referral to Gastroenterology   Complete by: As directed    What is the reason for referral?: Other Comment - intermittent rectal bleeding   Ambulatory referral to General Surgery   Complete by: As directed    What is the reason for referral?:  Comment - abdominal wall hernia   Diet general   Complete by: As directed    Increase activity slowly   Complete by: As directed       Allergies as of 03/26/2023       Reactions   Clonidine Derivatives Other (See Comments)   Non-specific heart block  Medication List     STOP taking these medications    olmesartan 40 MG tablet Commonly known as: BENICAR       TAKE these medications    acetaminophen 650 MG CR tablet Commonly known as: TYLENOL Take 650 mg by mouth 3 (three) times daily as needed for pain.   amLODipine 10 MG  tablet Commonly known as: NORVASC Take 10 mg by mouth daily.   aspirin EC 81 MG tablet Take 81 mg by mouth daily. Swallow whole.   bictegravir-emtricitabine-tenofovir AF 50-200-25 MG Tabs tablet Commonly known as: BIKTARVY Take 1 tablet by mouth daily.   citalopram 20 MG tablet Commonly known as: CELEXA Take 20 mg by mouth daily.   doxazosin 2 MG tablet Commonly known as: Cardura Take 1 tablet (2 mg total) by mouth daily.   fexofenadine 60 MG tablet Commonly known as: ALLEGRA Take 60 mg by mouth 2 (two) times daily.   hydrALAZINE 100 MG tablet Commonly known as: APRESOLINE Take 1 tablet (100 mg total) by mouth 3 (three) times daily.   IRON PO Take 45 mg by mouth daily.   Memantine HCl-Donepezil HCl 21-10 MG Cp24 Take 1 capsule by mouth daily.   PreserVision AREDS 2 Caps Take 1 capsule by mouth daily.   Vitamin D3 20 MCG (800 UNIT) Tabs Take 1 tablet by mouth daily.        Allergies  Allergen Reactions   Clonidine Derivatives Other (See Comments)    Non-specific heart block    Consultations: None   Procedures/Studies: No results found. (Echo, Carotid, EGD, Colonoscopy, ERCP)    Subjective: Patient seen and examined.  Denies any complaints.  Wants to go home.  Walked around in the hallway.  Independent.  Slightly forgetful.  Denies any abdominal pain.  Denies any pain at the hernia site.  She had more bowel movements in the hospital with no blood.   Discharge Exam: Vitals:   03/26/23 0410 03/26/23 1310  BP: (!) 149/54 (!) 135/50  Pulse: (!) 46 (!) 52  Resp: 18 17  Temp: 97.7 F (36.5 C) 97.6 F (36.4 C)  SpO2: 97% 98%   Vitals:   03/26/23 0100 03/26/23 0141 03/26/23 0410 03/26/23 1310  BP: 139/76 (!) 137/57 (!) 149/54 (!) 135/50  Pulse: (!) 53 (!) 47 (!) 46 (!) 52  Resp:  18 18 17   Temp:  98.1 F (36.7 C) 97.7 F (36.5 C) 97.6 F (36.4 C)  TempSrc:  Oral Oral Oral  SpO2: 97% 100% 97% 98%  Weight:  55.1 kg    Height:  5\' 8"  (1.727 m)       General: Pt is alert, awake, not in acute distress Cardiovascular: RRR, S1/S2 +, no rubs, no gallops Respiratory: CTA bilaterally, no wheezing, no rhonchi Abdominal: Soft, NT, ND, bowel sounds +, reducible abdominal hernia right lateral quadrant, nontender.  Bowel sounds present. Extremities: no edema, no cyanosis    The results of significant diagnostics from this hospitalization (including imaging, microbiology, ancillary and laboratory) are listed below for reference.     Microbiology: No results found for this or any previous visit (from the past 240 hour(s)).   Labs: BNP (last 3 results) No results for input(s): "BNP" in the last 8760 hours. Basic Metabolic Panel: Recent Labs  Lab 03/25/23 1723 03/26/23 0429  NA 142 140  K 5.8* 5.0  CL 110 106  CO2 23 24  GLUCOSE 101* 79  BUN 67* 50*  CREATININE 1.86* 1.57*  CALCIUM 9.8 9.8  Liver Function Tests: Recent Labs  Lab 03/25/23 1723  AST 17  ALT 15  ALKPHOS 75  BILITOT 0.6  PROT 7.7  ALBUMIN 4.0   No results for input(s): "LIPASE", "AMYLASE" in the last 168 hours. No results for input(s): "AMMONIA" in the last 168 hours. CBC: Recent Labs  Lab 03/25/23 1723 03/25/23 2231 03/26/23 0429  WBC 5.2  --  5.6  NEUTROABS 3.1  --   --   HGB 8.3* 8.6* 8.3*  HCT 26.0* 27.4* 25.5*  MCV 115.6*  --  112.8*  PLT 319  --  309   Cardiac Enzymes: No results for input(s): "CKTOTAL", "CKMB", "CKMBINDEX", "TROPONINI" in the last 168 hours. BNP: Invalid input(s): "POCBNP" CBG: No results for input(s): "GLUCAP" in the last 168 hours. D-Dimer No results for input(s): "DDIMER" in the last 72 hours. Hgb A1c No results for input(s): "HGBA1C" in the last 72 hours. Lipid Profile No results for input(s): "CHOL", "HDL", "LDLCALC", "TRIG", "CHOLHDL", "LDLDIRECT" in the last 72 hours. Thyroid function studies No results for input(s): "TSH", "T4TOTAL", "T3FREE", "THYROIDAB" in the last 72 hours.  Invalid input(s):  "FREET3" Anemia work up No results for input(s): "VITAMINB12", "FOLATE", "FERRITIN", "TIBC", "IRON", "RETICCTPCT" in the last 72 hours. Urinalysis    Component Value Date/Time   COLORURINE YELLOW 03/25/2023 2043   APPEARANCEUR HAZY (A) 03/25/2023 2043   LABSPEC 1.017 03/25/2023 2043   PHURINE 7.0 03/25/2023 2043   GLUCOSEU NEGATIVE 03/25/2023 2043   GLUCOSEU NEG mg/dL 82/95/6213 0865   HGBUR NEGATIVE 03/25/2023 2043   BILIRUBINUR NEGATIVE 03/25/2023 2043   KETONESUR NEGATIVE 03/25/2023 2043   PROTEINUR 30 (A) 03/25/2023 2043   UROBILINOGEN 0.2 07/23/2013 1431   NITRITE NEGATIVE 03/25/2023 2043   LEUKOCYTESUR LARGE (A) 03/25/2023 2043   Sepsis Labs Recent Labs  Lab 03/25/23 1723 03/26/23 0429  WBC 5.2 5.6   Microbiology No results found for this or any previous visit (from the past 240 hour(s)).   Time coordinating discharge: 35 minutes  SIGNED:   Dorcas Carrow, MD  [facility] 03/26/2023, 2:43 PM

## 2023-03-27 ENCOUNTER — Encounter: Payer: Self-pay | Admitting: Gastroenterology

## 2023-03-27 LAB — URINE CULTURE

## 2023-04-01 ENCOUNTER — Encounter: Payer: Self-pay | Admitting: Internal Medicine

## 2023-04-05 ENCOUNTER — Other Ambulatory Visit: Payer: Self-pay

## 2023-04-05 ENCOUNTER — Emergency Department (HOSPITAL_COMMUNITY): Payer: Medicare (Managed Care)

## 2023-04-05 ENCOUNTER — Other Ambulatory Visit (HOSPITAL_COMMUNITY): Payer: Medicare (Managed Care)

## 2023-04-05 ENCOUNTER — Other Ambulatory Visit (HOSPITAL_COMMUNITY): Payer: Self-pay | Admitting: *Deleted

## 2023-04-05 ENCOUNTER — Inpatient Hospital Stay (HOSPITAL_COMMUNITY)
Admission: EM | Admit: 2023-04-05 | Discharge: 2023-04-08 | DRG: 065 | Disposition: A | Discharge status: Home Health | Payer: Medicare (Managed Care) | Attending: Family Medicine | Admitting: Family Medicine

## 2023-04-05 ENCOUNTER — Encounter (HOSPITAL_COMMUNITY): Payer: Self-pay

## 2023-04-05 ENCOUNTER — Observation Stay (HOSPITAL_BASED_OUTPATIENT_CLINIC_OR_DEPARTMENT_OTHER): Payer: Medicare (Managed Care)

## 2023-04-05 DIAGNOSIS — R001 Bradycardia, unspecified: Secondary | ICD-10-CM | POA: Diagnosis present

## 2023-04-05 DIAGNOSIS — I6389 Other cerebral infarction: Secondary | ICD-10-CM

## 2023-04-05 DIAGNOSIS — F0390 Unspecified dementia without behavioral disturbance: Secondary | ICD-10-CM | POA: Diagnosis present

## 2023-04-05 DIAGNOSIS — F32A Depression, unspecified: Secondary | ICD-10-CM | POA: Diagnosis present

## 2023-04-05 DIAGNOSIS — I872 Venous insufficiency (chronic) (peripheral): Secondary | ICD-10-CM | POA: Diagnosis present

## 2023-04-05 DIAGNOSIS — I639 Cerebral infarction, unspecified: Secondary | ICD-10-CM | POA: Diagnosis present

## 2023-04-05 DIAGNOSIS — I441 Atrioventricular block, second degree: Secondary | ICD-10-CM | POA: Diagnosis present

## 2023-04-05 DIAGNOSIS — B370 Candidal stomatitis: Secondary | ICD-10-CM | POA: Diagnosis present

## 2023-04-05 DIAGNOSIS — Z66 Do not resuscitate: Secondary | ICD-10-CM | POA: Diagnosis present

## 2023-04-05 DIAGNOSIS — R29810 Facial weakness: Secondary | ICD-10-CM | POA: Diagnosis not present

## 2023-04-05 DIAGNOSIS — R471 Dysarthria and anarthria: Secondary | ICD-10-CM | POA: Diagnosis present

## 2023-04-05 DIAGNOSIS — I63412 Cerebral infarction due to embolism of left middle cerebral artery: Secondary | ICD-10-CM | POA: Diagnosis not present

## 2023-04-05 DIAGNOSIS — I10 Essential (primary) hypertension: Secondary | ICD-10-CM | POA: Diagnosis present

## 2023-04-05 DIAGNOSIS — I63239 Cerebral infarction due to unspecified occlusion or stenosis of unspecified carotid arteries: Secondary | ICD-10-CM

## 2023-04-05 DIAGNOSIS — B20 Human immunodeficiency virus [HIV] disease: Secondary | ICD-10-CM | POA: Diagnosis not present

## 2023-04-05 DIAGNOSIS — R29703 NIHSS score 3: Secondary | ICD-10-CM | POA: Diagnosis not present

## 2023-04-05 DIAGNOSIS — I700 Atherosclerosis of aorta: Secondary | ICD-10-CM | POA: Diagnosis present

## 2023-04-05 DIAGNOSIS — R410 Disorientation, unspecified: Secondary | ICD-10-CM | POA: Diagnosis present

## 2023-04-05 DIAGNOSIS — R29709 NIHSS score 9: Secondary | ICD-10-CM | POA: Diagnosis not present

## 2023-04-05 DIAGNOSIS — I1310 Hypertensive heart and chronic kidney disease without heart failure, with stage 1 through stage 4 chronic kidney disease, or unspecified chronic kidney disease: Secondary | ICD-10-CM | POA: Diagnosis present

## 2023-04-05 DIAGNOSIS — Z79899 Other long term (current) drug therapy: Secondary | ICD-10-CM

## 2023-04-05 DIAGNOSIS — Z8249 Family history of ischemic heart disease and other diseases of the circulatory system: Secondary | ICD-10-CM

## 2023-04-05 DIAGNOSIS — R29708 NIHSS score 8: Secondary | ICD-10-CM | POA: Diagnosis present

## 2023-04-05 DIAGNOSIS — K439 Ventral hernia without obstruction or gangrene: Secondary | ICD-10-CM | POA: Diagnosis present

## 2023-04-05 DIAGNOSIS — G309 Alzheimer's disease, unspecified: Secondary | ICD-10-CM | POA: Diagnosis present

## 2023-04-05 DIAGNOSIS — I6521 Occlusion and stenosis of right carotid artery: Secondary | ICD-10-CM | POA: Diagnosis present

## 2023-04-05 DIAGNOSIS — Z681 Body mass index (BMI) 19 or less, adult: Secondary | ICD-10-CM

## 2023-04-05 DIAGNOSIS — Z7982 Long term (current) use of aspirin: Secondary | ICD-10-CM

## 2023-04-05 DIAGNOSIS — R29704 NIHSS score 4: Secondary | ICD-10-CM | POA: Diagnosis not present

## 2023-04-05 DIAGNOSIS — Z8541 Personal history of malignant neoplasm of cervix uteri: Secondary | ICD-10-CM

## 2023-04-05 DIAGNOSIS — Z90710 Acquired absence of both cervix and uterus: Secondary | ICD-10-CM

## 2023-04-05 DIAGNOSIS — R29706 NIHSS score 6: Secondary | ICD-10-CM | POA: Diagnosis not present

## 2023-04-05 DIAGNOSIS — I119 Hypertensive heart disease without heart failure: Secondary | ICD-10-CM | POA: Diagnosis present

## 2023-04-05 DIAGNOSIS — F0283 Dementia in other diseases classified elsewhere, unspecified severity, with mood disturbance: Secondary | ICD-10-CM | POA: Diagnosis present

## 2023-04-05 DIAGNOSIS — Z87891 Personal history of nicotine dependence: Secondary | ICD-10-CM

## 2023-04-05 DIAGNOSIS — Z888 Allergy status to other drugs, medicaments and biological substances status: Secondary | ICD-10-CM

## 2023-04-05 DIAGNOSIS — D631 Anemia in chronic kidney disease: Secondary | ICD-10-CM | POA: Diagnosis present

## 2023-04-05 DIAGNOSIS — N1832 Chronic kidney disease, stage 3b: Secondary | ICD-10-CM | POA: Diagnosis present

## 2023-04-05 DIAGNOSIS — R1310 Dysphagia, unspecified: Secondary | ICD-10-CM

## 2023-04-05 DIAGNOSIS — D638 Anemia in other chronic diseases classified elsewhere: Secondary | ICD-10-CM | POA: Diagnosis present

## 2023-04-05 DIAGNOSIS — R4701 Aphasia: Secondary | ICD-10-CM | POA: Diagnosis present

## 2023-04-05 DIAGNOSIS — I63419 Cerebral infarction due to embolism of unspecified middle cerebral artery: Principal | ICD-10-CM

## 2023-04-05 LAB — COMPREHENSIVE METABOLIC PANEL
ALT: 20 U/L (ref 0–44)
AST: 31 U/L (ref 15–41)
Albumin: 4.1 g/dL (ref 3.5–5.0)
Alkaline Phosphatase: 69 U/L (ref 38–126)
Anion gap: 11 (ref 5–15)
BUN: 34 mg/dL — ABNORMAL HIGH (ref 8–23)
CO2: 22 mmol/L (ref 22–32)
Calcium: 10.3 mg/dL (ref 8.9–10.3)
Chloride: 103 mmol/L (ref 98–111)
Creatinine, Ser: 1.73 mg/dL — ABNORMAL HIGH (ref 0.44–1.00)
GFR, Estimated: 28 mL/min — ABNORMAL LOW (ref >60)
Glucose, Bld: 98 mg/dL (ref 70–99)
Potassium: 4.6 mmol/L (ref 3.5–5.1)
Sodium: 136 mmol/L (ref 135–145)
Total Bilirubin: 0.7 mg/dL (ref <1.2)
Total Protein: 8 g/dL (ref 6.5–8.1)

## 2023-04-05 LAB — I-STAT CHEM 8, ED
BUN: 31 mg/dL — ABNORMAL HIGH (ref 8–23)
Calcium, Ion: 1.33 mmol/L (ref 1.15–1.40)
Chloride: 107 mmol/L (ref 98–111)
Creatinine, Ser: 1.9 mg/dL — ABNORMAL HIGH (ref 0.44–1.00)
Glucose, Bld: 94 mg/dL (ref 70–99)
HCT: 28 % — ABNORMAL LOW (ref 36.0–46.0)
Hemoglobin: 9.5 g/dL — ABNORMAL LOW (ref 12.0–15.0)
Potassium: 4.7 mmol/L (ref 3.5–5.1)
Sodium: 139 mmol/L (ref 135–145)
TCO2: 22 mmol/L (ref 22–32)

## 2023-04-05 LAB — LIPID PANEL
Cholesterol: 162 mg/dL (ref 0–200)
HDL: 81 mg/dL (ref >40)
LDL Cholesterol: 69 mg/dL (ref 0–99)
Total CHOL/HDL Ratio: 2 {ratio}
Triglycerides: 61 mg/dL (ref <150)
VLDL: 12 mg/dL (ref 0–40)

## 2023-04-05 LAB — ECHOCARDIOGRAM COMPLETE BUBBLE STUDY
AR max vel: 1.81 cm2
AV Area VTI: 1.83 cm2
AV Area mean vel: 1.84 cm2
AV Mean grad: 8 mm[Hg]
AV Peak grad: 17.6 mm[Hg]
Ao pk vel: 2.1 m/s
Area-P 1/2: 4.26 cm2
S' Lateral: 3 cm

## 2023-04-05 LAB — DIFFERENTIAL
Abs Immature Granulocytes: 0.02 10*3/uL (ref 0.00–0.07)
Basophils Absolute: 0 10*3/uL (ref 0.0–0.1)
Basophils Relative: 0 %
Eosinophils Absolute: 0 10*3/uL (ref 0.0–0.5)
Eosinophils Relative: 0 %
Immature Granulocytes: 0 %
Lymphocytes Relative: 12 %
Lymphs Abs: 0.8 10*3/uL (ref 0.7–4.0)
Monocytes Absolute: 0.7 10*3/uL (ref 0.1–1.0)
Monocytes Relative: 10 %
Neutro Abs: 5.2 10*3/uL (ref 1.7–7.7)
Neutrophils Relative %: 78 %

## 2023-04-05 LAB — RAPID URINE DRUG SCREEN, HOSP PERFORMED
Amphetamines: NOT DETECTED
Barbiturates: NOT DETECTED
Benzodiazepines: NOT DETECTED
Cocaine: NOT DETECTED
Opiates: NOT DETECTED
Tetrahydrocannabinol: NOT DETECTED

## 2023-04-05 LAB — URINALYSIS, ROUTINE W REFLEX MICROSCOPIC
Bilirubin Urine: NEGATIVE
Glucose, UA: NEGATIVE mg/dL
Hgb urine dipstick: NEGATIVE
Ketones, ur: NEGATIVE mg/dL
Leukocytes,Ua: NEGATIVE
Nitrite: NEGATIVE
Protein, ur: 100 mg/dL — AB
Specific Gravity, Urine: 1.009 (ref 1.005–1.030)
pH: 8 (ref 5.0–8.0)

## 2023-04-05 LAB — HEMOGLOBIN A1C
Hgb A1c MFr Bld: 4.9 % (ref 4.8–5.6)
Mean Plasma Glucose: 93.93 mg/dL

## 2023-04-05 LAB — MRSA NEXT GEN BY PCR, NASAL: MRSA by PCR Next Gen: NOT DETECTED

## 2023-04-05 LAB — CBC
HCT: 26.6 % — ABNORMAL LOW (ref 36.0–46.0)
Hemoglobin: 8.6 g/dL — ABNORMAL LOW (ref 12.0–15.0)
MCH: 36 pg — ABNORMAL HIGH (ref 26.0–34.0)
MCHC: 32.3 g/dL (ref 30.0–36.0)
MCV: 111.3 fL — ABNORMAL HIGH (ref 80.0–100.0)
Platelets: 351 10*3/uL (ref 150–400)
RBC: 2.39 MIL/uL — ABNORMAL LOW (ref 3.87–5.11)
RDW: 13.2 % (ref 11.5–15.5)
WBC: 6.8 10*3/uL (ref 4.0–10.5)
nRBC: 0 % (ref 0.0–0.2)

## 2023-04-05 LAB — CBG MONITORING, ED: Glucose-Capillary: 92 mg/dL (ref 70–99)

## 2023-04-05 LAB — ETHANOL: Alcohol, Ethyl (B): <10 mg/dL (ref <10)

## 2023-04-05 MED ORDER — CITALOPRAM HYDROBROMIDE 20 MG PO TABS
20.0000 mg | ORAL_TABLET | Freq: Every day | ORAL | Status: DC
Start: 2023-04-06 — End: 2023-04-08
  Administered 2023-04-06 – 2023-04-08 (×3): 20 mg via ORAL
  Filled 2023-04-05 (×3): qty 1

## 2023-04-05 MED ORDER — CHLORHEXIDINE GLUCONATE CLOTH 2 % EX PADS
6.0000 | MEDICATED_PAD | Freq: Every day | CUTANEOUS | Status: DC
  Administered 2023-04-05 – 2023-04-07 (×3): 6 via TOPICAL

## 2023-04-05 MED ORDER — ACETAMINOPHEN 325 MG PO TABS: 650.0000 mg | ORAL_TABLET | ORAL | Status: DC | PRN

## 2023-04-05 MED ORDER — NYSTATIN 100000 UNIT/ML MT SUSP
5.0000 mL | Freq: Four times a day (QID) | OROMUCOSAL | Status: DC
  Administered 2023-04-05: 500000 [IU] via ORAL
  Filled 2023-04-05: qty 5

## 2023-04-05 MED ORDER — BICTEGRAVIR-EMTRICITAB-TENOFOV 50-200-25 MG PO TABS
1.0000 | ORAL_TABLET | Freq: Every day | ORAL | Status: DC
  Administered 2023-04-06 – 2023-04-08 (×3): 1 via ORAL
  Filled 2023-04-05 (×6): qty 1

## 2023-04-05 MED ORDER — SODIUM CHLORIDE 0.9 % IV SOLN: INTRAVENOUS | Status: AC

## 2023-04-05 MED ORDER — ASPIRIN 300 MG RE SUPP: 300.0000 mg | RECTAL | Status: DC

## 2023-04-05 MED ORDER — ATROPINE SULFATE 1 MG/ML IV SOLN: 0.5000 mg | Freq: Once | INTRAVENOUS | Status: AC | PRN

## 2023-04-05 MED ORDER — MEMANTINE HCL-DONEPEZIL HCL ER 21-10 MG PO CP24: 1.0000 | ORAL_CAPSULE | Freq: Every evening | ORAL | Status: DC

## 2023-04-05 MED ORDER — ASPIRIN 300 MG RE SUPP
300.0000 mg | Freq: Every day | RECTAL | Status: DC
Start: 2023-04-06 — End: 2023-04-05

## 2023-04-05 MED ORDER — STROKE: EARLY STAGES OF RECOVERY BOOK
Freq: Once | Status: AC
Start: 2023-04-06 — End: 2023-04-06
  Filled 2023-04-05: qty 1

## 2023-04-05 MED ORDER — ACETAMINOPHEN 160 MG/5ML PO SOLN: 650.0000 mg | ORAL | Status: DC | PRN

## 2023-04-05 MED ORDER — HEPARIN SODIUM (PORCINE) 5000 UNIT/ML IJ SOLN
5000.0000 [IU] | Freq: Three times a day (TID) | INTRAMUSCULAR | Status: DC
  Administered 2023-04-05 – 2023-04-08 (×9): 5000 [IU] via SUBCUTANEOUS
  Filled 2023-04-05 (×9): qty 1

## 2023-04-05 MED ORDER — PERFLUTREN LIPID MICROSPHERE: 1.0000 mL | INTRAVENOUS | Status: AC | PRN

## 2023-04-05 MED ORDER — ACETAMINOPHEN 650 MG RE SUPP: 650.0000 mg | RECTAL | Status: DC | PRN

## 2023-04-05 MED ORDER — ATROPINE SULFATE 1 MG/ML IV SOLN: 0.5000 mg | Freq: Once | INTRAVENOUS | Status: DC | PRN

## 2023-04-05 MED ORDER — ASPIRIN 325 MG PO TABS
325.0000 mg | ORAL_TABLET | Freq: Once | ORAL | Status: AC
Start: 2023-04-05 — End: 2023-04-05
  Administered 2023-04-05: 325 mg via ORAL
  Filled 2023-04-05: qty 1

## 2023-04-05 MED ORDER — FLUCONAZOLE 100 MG PO TABS
100.0000 mg | ORAL_TABLET | Freq: Every day | ORAL | Status: AC
  Administered 2023-04-05 – 2023-04-06 (×2): 100 mg via ORAL
  Filled 2023-04-05 (×2): qty 1

## 2023-04-05 MED ORDER — DONEPEZIL HCL 5 MG PO TABS
10.0000 mg | ORAL_TABLET | Freq: Every day | ORAL | Status: DC
  Administered 2023-04-05 – 2023-04-07 (×3): 10 mg via ORAL
  Filled 2023-04-05 (×3): qty 2

## 2023-04-05 MED ORDER — CLOPIDOGREL BISULFATE 75 MG PO TABS
75.0000 mg | ORAL_TABLET | Freq: Every day | ORAL | Status: DC
  Administered 2023-04-05 – 2023-04-08 (×4): 75 mg via ORAL
  Filled 2023-04-05 (×4): qty 1

## 2023-04-05 MED ORDER — SENNOSIDES-DOCUSATE SODIUM 8.6-50 MG PO TABS: 1.0000 | ORAL_TABLET | Freq: Every evening | ORAL | Status: DC | PRN

## 2023-04-05 MED ORDER — MEMANTINE HCL ER 7 MG PO CP24
21.0000 mg | ORAL_CAPSULE | Freq: Every day | ORAL | Status: DC
  Administered 2023-04-05 – 2023-04-07 (×3): 21 mg via ORAL
  Filled 2023-04-05 (×5): qty 3

## 2023-04-05 MED ORDER — ATROPINE SULFATE 1 MG/ML IV SOLN
0.5000 mg | Freq: Once | INTRAVENOUS | Status: AC
  Administered 2023-04-05: 0.5 mg via INTRAVENOUS
  Filled 2023-04-05: qty 1

## 2023-04-05 MED ORDER — ASPIRIN 81 MG PO CHEW
81.0000 mg | CHEWABLE_TABLET | Freq: Every day | ORAL | Status: DC
  Administered 2023-04-06 – 2023-04-08 (×3): 81 mg via ORAL
  Filled 2023-04-05 (×3): qty 1

## 2023-04-05 NOTE — ED Notes (Signed)
Tele-neurologist evaluating pt via tele-monitor at this time.

## 2023-04-05 NOTE — Consult Note (Signed)
I connected with  Dixon Boos on 04/05/23 by a video enabled telemedicine application and verified that I am speaking with the correct person using two identifiers.   I discussed the limitations of evaluation and management by telemedicine. The patient expressed understanding and agreed to proceed.  Location of patient:[facility] Location of physician: [facility]  Neurology Consultation Reason for Consult: stroke Referring Physician: DR Vonita Moss  CC: aphasia, facial droop  History is obtained from: chart review, patient  HPI: Karen Luna is a 89 y.o. female with PMH of HTN, HIV on Biktarvy with CD4 408 and CKD 3, Alzheimer's dementia who was brought in due to unable to speak. Son last saw her normal on 04/04/2023 at around 1800. This morning he noticed she was unable to speak. On asa 81mg  at home  LKN: 04/04/2023 at 1800 Event happened at home/[facility] No tpa as outside window No thrombectomy at no LVO mRS 0   ROS: Unable to obtain due to aphasia  Past Medical History:  Diagnosis Date   Anemia of chronic disease 07/06/2011   Aortic atherosclerosis (HCC) 01/06/2015   Seen on CT scan, currently asymptomatic   Cancer (HCC)    uterine( per pt)   Chronic kidney disease, stage 3 (HCC) 09/01/2008   Chronic venous insufficiency 10/03/2012   Essential hypertension 09/19/2006   History of cervical cancer 08/11/2012   s/p hysterectomy, remote, specifics unknown    Human immunodeficiency virus (HIV) disease (HCC) 09/12/2006   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 07/25/2012   Low grade squamous intraepithelial lesion (LGSIL) on cervical Pap smear 08/06/2007   LGSIL: 08/06/2007, 11/05/2008, 05/11/2009 ASC-US: 05/27/2008, 08/31/2009, 05/24/2010, 05/23/2012 High Risk HPV: Detected    Onychomycosis of left great toe 09/09/2015   Overweight (BMI 25.0-29.9) 07/25/2012    Family History  Problem Relation Age of Onset   Hypertension Mother    Heart disease Mother     Hypertension Father    Heart disease Father    Hypertension Sister    HIV/AIDS Daughter    Early death Sister        Gun shot wound   Cerebrovascular Accident Son     Social History:  reports that she quit smoking about 34 years ago. Her smoking use included cigarettes. She has never used smokeless tobacco. She reports that she does not drink alcohol and does not use drugs.   Exam: Current vital signs: BP (!) 168/59   Pulse (!) 47   Temp 98.6 F (37 C) (Oral)   Resp 16   Ht 5\' 8"  (1.727 m)   Wt 52.8 kg   SpO2 99%   BMI 17.70 kg/m  Vital signs in last 24 hours: Temp:  [98.6 F (37 C)] 98.6 F (37 C) (12/06 0842) Pulse Rate:  [45-54] 47 (12/06 1133) Resp:  [14-17] 16 (12/06 1133) BP: (158-179)/(59-64) 168/59 (12/06 1130) SpO2:  [98 %-99 %] 99 % (12/06 1133) Weight:  [52.8 kg] 52.8 kg (12/06 0820)   Physical Exam  Constitutional: Appears well-developed and well-nourished.  Psych: Affect appropriate to situation Neuro: awake, alert, mute but at times says "yeah" able to wrote down her name and age correctly, able to follow commands, right facial droop, antigravity in all extremities without drift, denies any sensory deficit, FTN intact  NIHSS 5            NIHSS components Score: Comment  1a Level of Conscious 0[x]  1[]  2[]  3[]         1b  LOC Questions 0[x]  1[]  2[]           1c LOC Commands 0[x]  1[]  2[]           2 Best Gaze 0[x]  1[]  2[]           3 Visual 0[x]  1[]  2[]  3[]         4 Facial Palsy 0[]  1[]  2[x]  3[]         5a Motor Arm - left 0[x]  1[]  2[]  3[]  4[]  UN[]     5b Motor Arm - Right 0[x]  1[]  2[]  3[]  4[]  UN[]     6a Motor Leg - Left 0[x]  1[]  2[]  3[]  4[]  UN[]     6b Motor Leg - Right 0[x]  1[]  2[]  3[]  4[]  UN[]     7 Limb Ataxia 0[x]  1[]  2[]  3[]  UN[]       8 Sensory 0[x]  1[]  2[]  UN[]         9 Best Language 0[]  1[]  2[]  3[x]         10 Dysarthria 0[x]  1[]  2[]  UN[]         11 Extinct. and Inattention 0[x]  1[]  2[]           TOTAL: 2      I have reviewed labs in epic and the  results pertinent to this consultation are: CBC:  Recent Labs  Lab 04/05/23 0840 04/05/23 0842  WBC 6.8  --   NEUTROABS 5.2  --   HGB 8.6* 9.5*  HCT 26.6* 28.0*  MCV 111.3*  --   PLT 351  --     Basic Metabolic Panel:  Lab Results  Component Value Date   NA 139 04/05/2023   K 4.7 04/05/2023   CO2 22 04/05/2023   GLUCOSE 94 04/05/2023   BUN 31 (H) 04/05/2023   CREATININE 1.90 (H) 04/05/2023   CALCIUM 10.3 04/05/2023   GFRNONAA 28 (L) 04/05/2023   GFRAA 37 (L) 06/08/2020   Lipid Panel:  Lab Results  Component Value Date   LDLCALC 87 06/08/2020   HgbA1c: No results found for: "HGBA1C" Urine Drug Screen:     Component Value Date/Time   LABOPIA NONE DETECTED 03/24/2016 0454   COCAINSCRNUR NONE DETECTED 03/24/2016 0454   LABBENZ NONE DETECTED 03/24/2016 0454   AMPHETMU NONE DETECTED 03/24/2016 0454   THCU NONE DETECTED 03/24/2016 0454   LABBARB NONE DETECTED 03/24/2016 0454    Alcohol Level     Component Value Date/Time   ETH <10 04/05/2023 0840     I have reviewed the images obtained:   CT Head without contrast 04/05/2023: No evidence of acute intracranial abnormality.  Moderate chronic small vessel ischemic disease.  MRI Brain and MRA head wo contrast 04/05/2023:  Acute infarct involving the left insula and left frontal operculum. Occlusion of a distal left M2/MCA branch to the area of acute stroke. Severe stenosis at the paraclinoid segment of the right ICA and mild-to-moderate stenosis at the proximal petrous segment of the left ICA. High-grade stenosis at the distal left P2/PCA. Ectatic appearance of cavernous right ICA with a 3 mm laterally projecting outpouching suggestive of an small (extradural) aneurysm.     ASSESSMENT/PLAN: 89 yo F with comorbidity as noted in HPI brought in with aphasia and mri brain showed acute stroke  Acute ischemic stroke - Etiology: likely embolic  Recommendations: - Asa 325 mg stat and asa 81mg  plus plavix 75mg  daily for 3  months followed by monotherapy with plavix 75mg  daily - carotid US ordered - TTE ordered - If both carotid US and TTE negative, recommend  30 day cardiac monitor to llook for paroxsymal  Afib  - Goal BP: permissive HTN with to 220/120 for 24-48 hours followed by gradual normotension - A1c and LDL ordered. If LDL>70, please add atorvastatin 40mg  daily -Okay to resume heparin at stroke protocol, no bolus  - q4 hr neuro checks and NIHSSS per stroke prootocl - STAT head CT for any change in neuro exam - Tele - PT/OT/SLP - Stroke education - Amb referral to neurology upon discharge  ( order placed) - Discussed plan with son and Dr Jarold Motto   Thank you for allowing [place] to participate in the care of this patient. If you have any further questions, please contact  me or neurohospitalist.   Lindie Spruce [facility] neurohospitalist

## 2023-04-05 NOTE — ED Notes (Signed)
Pt had passed swallow screen, plavix and ASA given and pt began to choke, pt managed to swallow the medication and water but with some coughing immediately afterwards. EDP made aware. Swallow screen changed to failed.

## 2023-04-05 NOTE — Evaluation (Signed)
Speech Language Pathology Evaluation Patient Details Name: Karen Luna MRN: [id number] DOB: January 08, 1932 Today's Date: 04/05/2023 Time: 7322-0254 SLP Time Calculation (min) (ACUTE ONLY): 24 min  Problem List:  Patient Active Problem List   Diagnosis Date Noted   Acute stroke due to ischemia (HCC) 04/05/2023   Aphasia 04/05/2023   Wenckebach second degree AV block 04/05/2023   Dysphagia 04/05/2023   Acute kidney injury (AKI) with acute tubular necrosis (ATN) (HCC) 03/26/2023   Acute kidney injury superimposed on CKD (HCC) 03/25/2023   Hyperkalemia 03/25/2023   Rectal bleeding 03/25/2023   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) 03/25/2023   Syncope 12/10/2022   Dizziness 07/18/2017   Vaccine counseling 01/08/2017   Dementia (HCC) 07/17/2016   Confusion 07/17/2016   Sinus congestion 06/27/2016   Severe major depression, single episode, with psychotic features (HCC) 06/21/2016   Onychomycosis of left great toe 09/09/2015   Aortic atherosclerosis (HCC) 01/06/2015   Acquired heart block 12/29/2014   Encounter for long-term (current) use of medications 09/16/2014   Screening examination for venereal disease 09/16/2014   Chronic venous insufficiency 10/03/2012   History of cervical cancer 08/11/2012   Healthcare maintenance 07/25/2012   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 07/25/2012   Anemia of chronic disease 07/06/2011   CKD stage 3b, GFR 30-44 ml/min (HCC) - baseline SCr 1.3-1.6 09/01/2008   Low grade squamous intraepithelial lesion (LGSIL) on cervical Pap smear 08/06/2007   Essential hypertension 09/19/2006   Human immunodeficiency virus (HIV) disease (HCC) 09/12/2006   Past Medical History:  Past Medical History:  Diagnosis Date   Anemia of chronic disease 07/06/2011   Aortic atherosclerosis (HCC) 01/06/2015   Seen on CT scan, currently asymptomatic   Cancer (HCC)    uterine( per pt)   Chronic kidney disease, stage 3 (HCC) 09/01/2008    Chronic venous insufficiency 10/03/2012   Essential hypertension 09/19/2006   History of cervical cancer 08/11/2012   s/p hysterectomy, remote, specifics unknown    Human immunodeficiency virus (HIV) disease (HCC) 09/12/2006   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 07/25/2012   Low grade squamous intraepithelial lesion (LGSIL) on cervical Pap smear 08/06/2007   LGSIL: 08/06/2007, 11/05/2008, 05/11/2009 ASC-US: 05/27/2008, 08/31/2009, 05/24/2010, 05/23/2012 High Risk HPV: Detected    Onychomycosis of left great toe 09/09/2015   Overweight (BMI 25.0-29.9) 07/25/2012   Past Surgical History:  Past Surgical History:  Procedure Laterality Date   ABDOMINAL HYSTERECTOMY     HPI:  Karen Luna is a 89 y/o female with dementia, HTN, HIV on antiretrovirals, stage 3b CKD, chronic large abdominal wall hernia, anemia of chronic disease, LV hypertrophy, history of cervical and uterine cancer, s/p hysterectomy, chronic venous insufficiency, aortic atherosclerosis who was recently hospitalized at [facility] 11/25-11/26 for an episode of painless rectal bleeding and hyperkalemia with AKI.  She recovered from that and the bleeding had stopped and she was discharged back home.  She was brought in today by son from home with concern for CVA.  She was last noted to be normal yesterday at 6pm.  This morning she is aphasic with a left sided facial droop and weakness in her right lower extremity.  Code stroke protocol in the ED and was seen by teleneurologist and MRI confimed acute infarct involving the left insula and left frontal operculum.  Pt has new findings of dysphagia.  Pt is being admitted for full stroke work up per neurology recommendations.   Assessment / Plan / Recommendation Clinical Impression  Pt presents  with mild receptive aphasia and severe expressive aphasia; Pt also presents with apraxia. Pt answered basic biographical y/n questions with 90% accuracy and more complex y/n questions with 30% accuracy.  Followed 1 step commands with 80% accuracy; accuracy significantly decreased with 2 to multi-step commands. Expressive speech is characterized by neologisms, semantic paraphasias, jargon and Pt is somewhat aware of errors. Repetition is only intact for short one syllable words. Pt did state her name accurately. Naming objects is <25% accurate. Cognition is difficult to assess given her severe expressive aphasia. Eval was brief secondary to fatigue. ST will continue to follow acutely, thank you.    SLP Assessment  SLP Recommendation/Assessment: Patient needs continued Speech Lanaguage Pathology Services SLP Visit Diagnosis: Apraxia (R48.2);Aphasia (R47.01)    Recommendations for follow up therapy are one component of a multi-disciplinary discharge planning process, led by the attending physician.  Recommendations may be updated based on patient status, additional functional criteria and insurance authorization.    Follow Up Recommendations  Acute inpatient rehab (3hours/day)    Assistance Recommended at Discharge     Functional Status Assessment Patient has had a recent decline in their functional status and demonstrates the ability to make significant improvements in function in a reasonable and predictable amount of time.  Frequency and Duration min 2x/week  1 week      SLP Evaluation Cognition  Overall Cognitive Status: Difficult to assess Orientation Level: Other (comment) (aphasic)       Comprehension  Auditory Comprehension Overall Auditory Comprehension: Impaired Yes/No Questions: Impaired Basic Biographical Questions: 76-100% accurate Basic Immediate Environment Questions: 75-100% accurate Complex Questions: 25-49% accurate Commands: Impaired One Step Basic Commands: 50-74% accurate Two Step Basic Commands: 25-49% accurate Reading Comprehension Reading Status: Unable to assess (comment)    Expression Expression Primary Mode of Expression: Verbal Verbal  Expression Overall Verbal Expression: Impaired Initiation: Impaired Automatic Speech: Counting;Name Level of Generative/Spontaneous Verbalization: Word;Phrase Repetition: Impaired Level of Impairment: Word level Naming: Impairment Responsive: 0-25% accurate Confrontation: Impaired Convergent: 0-24% accurate Divergent: 0-24% accurate Verbal Errors: Semantic paraphasias;Phonemic paraphasias;Neologisms;Aware of errors Effective Techniques: Melodic intonation;Articulatory cues;Phonemic cues   Oral / Motor  Oral Motor/Sensory Function Overall Oral Motor/Sensory Function: Generalized oral weakness Motor Speech Overall Motor Speech: Impaired Respiration: Within functional limits Articulation: Impaired Level of Impairment: Word Intelligibility: Intelligibility reduced Word: 25-49% accurate Phrase: 25-49% accurate Sentence: 25-49% accurate Conversation: 25-49% accurate Motor Planning: Impaired Level of Impairment: Word Motor Speech Errors: Aware;Unaware;Inconsistent           Casimira Sutphin H. Romie Levee, CCC-SLP Speech Language Pathologist  Georgetta Haber 04/05/2023, 3:34 PM

## 2023-04-05 NOTE — H&P (Addendum)
History and Physical  [facility]  DEMARA ABDALA NWG:[id number] DOB: 05/13/1931 DOA: 04/05/2023  PCP: [facility]  Patient coming from: Home  Level of care: Stepdown  I have personally briefly reviewed patient's old medical records in [facility]  Chief Complaint: stroke   HPI: Karen Luna is a 89 y/o female with dementia, HTN, HIV on antiretrovirals, stage 3b CKD, chronic large abdominal wall hernia, anemia of chronic disease, LV hypertrophy, history of cervical and uterine cancer, s/p hysterectomy, chronic venous insufficiency, aortic atherosclerosis who was recently hospitalized at [facility] 11/25-11/26 for an episode of painless rectal bleeding and hyperkalemia with AKI.  She recovered from that and the bleeding had stopped and she was discharged back home.  She was brought in today by son from home with concern for CVA.  She was last noted to be normal yesterday at 6pm.  This morning she is aphasic with a left sided facial droop and weakness in her right lower extremity.  Code stroke protocol in the ED and was seen by teleneurologist and MRI confimed acute infarct involving the left insula and left frontal operculum.  Pt has new findings of dysphagia.  Pt is being admitted for full stroke work up per neurology recommendations.      Past Medical History:  Diagnosis Date   Anemia of chronic disease 07/06/2011   Aortic atherosclerosis (HCC) 01/06/2015   Seen on CT scan, currently asymptomatic   Cancer (HCC)    uterine( per pt)   Chronic kidney disease, stage 3 (HCC) 09/01/2008   Chronic venous insufficiency 10/03/2012   Essential hypertension 09/19/2006   History of cervical cancer 08/11/2012   s/p hysterectomy, remote, specifics unknown    Human immunodeficiency virus (HIV) disease (HCC) 09/12/2006   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 07/25/2012   Low grade squamous intraepithelial lesion (LGSIL) on cervical Pap smear 08/06/2007    LGSIL: 08/06/2007, 11/05/2008, 05/11/2009 ASC-US: 05/27/2008, 08/31/2009, 05/24/2010, 05/23/2012 High Risk HPV: Detected    Onychomycosis of left great toe 09/09/2015   Overweight (BMI 25.0-29.9) 07/25/2012    Past Surgical History:  Procedure Laterality Date   ABDOMINAL HYSTERECTOMY       reports that she quit smoking about 34 years ago. Her smoking use included cigarettes. She has never used smokeless tobacco. She reports that she does not drink alcohol and does not use drugs.  Allergies  Allergen Reactions   Clonidine Derivatives Other (See Comments)    Non-specific heart block    Family History  Problem Relation Age of Onset   Hypertension Mother    Heart disease Mother    Hypertension Father    Heart disease Father    Hypertension Sister    HIV/AIDS Daughter    Early death Sister        Gun shot wound   Cerebrovascular Accident Son     Prior to Admission medications   Medication Sig Start Date End Date Taking? Authorizing Provider  acetaminophen (TYLENOL) 650 MG CR tablet Take 650 mg by mouth 3 (three) times daily as needed for pain.    [provider]  amLODipine (NORVASC) 10 MG tablet Take 10 mg by mouth daily.    [provider]  aspirin EC 81 MG tablet Take 81 mg by mouth daily. Swallow whole.    [provider]  bictegravir-emtricitabine-tenofovir AF (BIKTARVY) 50-200-25 MG TABS tablet Take 1 tablet by mouth daily. 06/26/22   Comer, Belia Heman, MD  Cholecalciferol (  VITAMIN D3) 20 MCG (800 UNIT) TABS Take 1 tablet by mouth daily.    [provider]  citalopram (CELEXA) 20 MG tablet Take 20 mg by mouth daily.     [provider]  doxazosin (CARDURA) 2 MG tablet Take 1 tablet (2 mg total) by mouth daily. 03/26/23 03/25/24  Dorcas Carrow, MD  Ferrous Sulfate (IRON PO) Take 45 mg by mouth daily.    [provider]  fexofenadine (ALLEGRA) 60 MG tablet Take 60 mg by mouth 2 (two) times daily.    [provider]  hydrALAZINE  (APRESOLINE) 100 MG tablet Take 1 tablet (100 mg total) by mouth 3 (three) times daily. 09/09/15   Doneen Poisson, MD  Memantine HCl-Donepezil HCl 21-10 MG CP24 Take 1 capsule by mouth daily.    [provider]  Multiple Vitamins-Minerals (PRESERVISION AREDS 2) CAPS Take 1 capsule by mouth daily.    [provider]    Physical Exam: Vitals:   04/05/23 1210 04/05/23 1315 04/05/23 1330 04/05/23 1400  BP:  (!) 176/68 (!) 158/61   Pulse: 75 (!) 56 (!) 55   Resp: 16 19 14    Temp:    98.2 F (36.8 C)  TempSrc:    Axillary  SpO2: 98% 90% 92%   Weight:    53.7 kg  Height:    5\' 8"  (1.727 m)    Constitutional: pt is aphasic.  NAD, calm, comfortable Eyes: PERRL, lids and conjunctivae normal ENMT: Mucous membranes are moist. Posterior pharynx clear of any exudate or lesions.  Neck: normal, supple, no masses, no thyromegaly Respiratory: clear to auscultation bilaterally, no wheezing, no crackles. Normal respiratory effort. No accessory muscle use.  Cardiovascular: normal s1, s2 sounds, no murmurs / rubs / gallops. No extremity edema. 2+ pedal pulses. No carotid bruits.  Abdomen: no tenderness, no masses palpated. No hepatosplenomegaly. Bowel sounds positive.  Musculoskeletal: no clubbing / cyanosis. No joint deformity upper and lower extremities. Good ROM, no contractures. Normal muscle tone.  Skin: no rashes, lesions, ulcers. No induration Neurologic: right facial droop, 4/5 strength in RUE LLE. FTN intact.   Psychiatric: UTD judgment and insight. Alert appears to have a normal mood.   Labs on Admission: I have personally reviewed following labs and imaging studies  CBC: Recent Labs  Lab 04/05/23 0840 04/05/23 0842  WBC 6.8  --   NEUTROABS 5.2  --   HGB 8.6* 9.5*  HCT 26.6* 28.0*  MCV 111.3*  --   PLT 351  --    Basic Metabolic Panel: Recent Labs  Lab 04/05/23 0840 04/05/23 0842  NA 136 139  K 4.6 4.7  CL 103 107  CO2 22  --   GLUCOSE 98 94  BUN 34* 31*   CREATININE 1.73* 1.90*  CALCIUM 10.3  --    GFR: Estimated Creatinine Clearance: 17 mL/min (A) (by C-G formula based on SCr of 1.9 mg/dL (H)). Liver Function Tests: Recent Labs  Lab 04/05/23 0840  AST 31  ALT 20  ALKPHOS 69  BILITOT 0.7  PROT 8.0  ALBUMIN 4.1   No results for input(s): "LIPASE", "AMYLASE" in the last 168 hours. No results for input(s): "AMMONIA" in the last 168 hours. Coagulation Profile: No results for input(s): "INR", "PROTIME" in the last 168 hours. Cardiac Enzymes: No results for input(s): "CKTOTAL", "CKMB", "CKMBINDEX", "TROPONINI" in the last 168 hours. BNP (last 3 results) No results for input(s): "PROBNP" in the last 8760 hours. HbA1C: No results for input(s): "HGBA1C" in the  last 72 hours. CBG: Recent Labs  Lab 04/05/23 0816  GLUCAP 92   Lipid Profile: Recent Labs    04/05/23 0842  CHOL 162  HDL 81  LDLCALC 69  TRIG 61  CHOLHDL 2.0   Thyroid Function Tests: No results for input(s): "TSH", "T4TOTAL", "FREET4", "T3FREE", "THYROIDAB" in the last 72 hours. Anemia Panel: No results for input(s): "VITAMINB12", "FOLATE", "FERRITIN", "TIBC", "IRON", "RETICCTPCT" in the last 72 hours. Urine analysis:    Component Value Date/Time   COLORURINE YELLOW 03/25/2023 2043   APPEARANCEUR HAZY (A) 03/25/2023 2043   LABSPEC 1.017 03/25/2023 2043   PHURINE 7.0 03/25/2023 2043   GLUCOSEU NEGATIVE 03/25/2023 2043   GLUCOSEU NEG mg/dL 24/40/1027 2536   HGBUR NEGATIVE 03/25/2023 2043   BILIRUBINUR NEGATIVE 03/25/2023 2043   KETONESUR NEGATIVE 03/25/2023 2043   PROTEINUR 30 (A) 03/25/2023 2043   UROBILINOGEN 0.2 07/23/2013 1431   NITRITE NEGATIVE 03/25/2023 2043   LEUKOCYTESUR LARGE (A) 03/25/2023 2043    Radiological Exams on Admission: US Carotid Bilateral  Result Date: 04/05/2023 CLINICAL DATA:  89 year old female with a history of stroke and left-sided facial droop EXAM: BILATERAL CAROTID DUPLEX ULTRASOUND TECHNIQUE: Wallace Cullens scale imaging, color  Doppler and duplex ultrasound were performed of bilateral carotid and vertebral arteries in the neck. COMPARISON:  None Available. FINDINGS: Criteria: Quantification of carotid stenosis is based on velocity parameters that correlate the residual internal carotid diameter with NASCET-based stenosis levels, using the diameter of the distal internal carotid lumen as the denominator for stenosis measurement. The following velocity measurements were obtained: RIGHT ICA:  Systolic 174 cm/sec, Diastolic 43 cm/sec CCA:  108 cm/sec SYSTOLIC ICA/CCA RATIO:  1.6 ECA:  92 cm/sec LEFT ICA:  Systolic 101 cm/sec, Diastolic 22 cm/sec CCA:  92 cm/sec SYSTOLIC ICA/CCA RATIO:  1.1 ECA:  103 cm/sec Right Brachial SBP: Not acquired Left Brachial SBP: Not acquired RIGHT CAROTID ARTERY: No significant calcifications of the right common carotid artery. Intermediate waveform maintained. Moderate heterogeneous and partially calcified plaque at the right carotid bifurcation. Shadowing present. Low resistance waveform of the right ICA. Tortuosity RIGHT VERTEBRAL ARTERY: Antegrade flow with low resistance waveform. LEFT CAROTID ARTERY: No significant calcifications of the left common carotid artery. Intermediate waveform maintained. Moderate heterogeneous and partially calcified plaque at the left carotid bifurcation. Shadowing present. Low resistance waveform of the left ICA. Tortuosity LEFT VERTEBRAL ARTERY:  Antegrade flow with low resistance waveform. IMPRESSION: Right: Heterogeneous and partially calcified plaque at the right carotid bifurcation contributes to 50%-69% stenosis by established duplex criteria. Left: Color duplex indicates moderate heterogeneous and calcified plaque, with no hemodynamically significant stenosis by duplex criteria in the extracranial cerebrovascular circulation. Signed, Yvone Neu. Miachel Roux, RPVI [facility] Radiology Electronically Signed   By: Gilmer Mor D.O.   On: 04/05/2023 13:35   MR BRAIN WO CONTRAST  Result Date: 04/05/2023 CLINICAL DATA:  Aphasia. EXAM: MRI HEAD WITHOUT CONTRAST MRA HEAD WITHOUT CONTRAST TECHNIQUE: Multiplanar, multi-echo pulse sequences of the brain and surrounding structures were acquired without intravenous contrast. Angiographic images of the Circle of Willis were acquired using MRA technique without intravenous contrast. COMPARISON:  Head CT April 05, 2023. FINDINGS: MRI HEAD FINDINGS Brain: Area of restricted diffusion involving the left insula and frontal operculum with corresponding faint T2 hyperintensity, consistent with acute infarct. No hemorrhage, hydrocephalus, mass or extra-axial collection. Scattered and confluent foci of T2 hyperintensity are seen within the white matter of the cerebral hemispheres nonspecific, likely related chronic small vessel ischemia. Moderate  parenchymal volume loss. Vascular: The major flow voids at the skull base are maintained. Skull and upper cervical spine: Degenerative changes of the cervical spine with mild anterolisthesis of C2 over C3. No focal marrow lesion. Sinuses/Orbits: Chronic opacification of the right maxillary sinus. The orbits are maintained. Other: None. MRA HEAD FINDINGS Anterior circulation: Patent bilateral intracranial internal carotid arteries with severe stenosis at the paraclinoid segment of the right ICA and mild-to-moderate stenosis at the proximal petrous segment of the left ICA. Ectatic appearance of cavernous right ICA with a 3 mm laterally projecting outpouching suggestive of an small (extradural) aneurysm. The bilateral M1 segments are patent. Occlusion of a distal left M2/MCA branch to the area of acute stroke. Posterior circulation: The bilateral intracranial vertebral arteries and basilar artery are patent without evidence of high-grade stenosis. The right posterior cerebral artery has normal flow related enhancement. There is a high-grade stenosis at the  distal left P2/PCA. Anatomic variants: None significant. IMPRESSION: 1. Acute infarct involving the left insula and left frontal operculum. 2. Occlusion of a distal left M2/MCA branch to the area of acute stroke. 3. Severe stenosis at the paraclinoid segment of the right ICA and mild-to-moderate stenosis at the proximal petrous segment of the left ICA. 4. High-grade stenosis at the distal left P2/PCA. 5. Ectatic appearance of cavernous right ICA with a 3 mm laterally projecting outpouching suggestive of an small (extradural) aneurysm. These results were called by telephone at the time of interpretation on 04/05/2023 at 11:18 am to provider [facility] , who verbally acknowledged these results. Electronically Signed   By: Baldemar Lenis M.D.   On: 04/05/2023 11:20   MR ANGIO HEAD WO CONTRAST  Result Date: 04/05/2023 CLINICAL DATA:  Aphasia. EXAM: MRI HEAD WITHOUT CONTRAST MRA HEAD WITHOUT CONTRAST TECHNIQUE: Multiplanar, multi-echo pulse sequences of the brain and surrounding structures were acquired without intravenous contrast. Angiographic images of the Circle of Willis were acquired using MRA technique without intravenous contrast. COMPARISON:  Head CT April 05, 2023. FINDINGS: MRI HEAD FINDINGS Brain: Area of restricted diffusion involving the left insula and frontal operculum with corresponding faint T2 hyperintensity, consistent with acute infarct. No hemorrhage, hydrocephalus, mass or extra-axial collection. Scattered and confluent foci of T2 hyperintensity are seen within the white matter of the cerebral hemispheres nonspecific, likely related chronic small vessel ischemia. Moderate parenchymal volume loss. Vascular: The major flow voids at the skull base are maintained. Skull and upper cervical spine: Degenerative changes of the cervical spine with mild anterolisthesis of C2 over C3. No focal marrow lesion. Sinuses/Orbits: Chronic opacification of the right maxillary sinus. The  orbits are maintained. Other: None. MRA HEAD FINDINGS Anterior circulation: Patent bilateral intracranial internal carotid arteries with severe stenosis at the paraclinoid segment of the right ICA and mild-to-moderate stenosis at the proximal petrous segment of the left ICA. Ectatic appearance of cavernous right ICA with a 3 mm laterally projecting outpouching suggestive of an small (extradural) aneurysm. The bilateral M1 segments are patent. Occlusion of a distal left M2/MCA branch to the area of acute stroke. Posterior circulation: The bilateral intracranial vertebral arteries and basilar artery are patent without evidence of high-grade stenosis. The right posterior cerebral artery has normal flow related enhancement. There is a high-grade stenosis at the distal left P2/PCA. Anatomic variants: None significant. IMPRESSION: 1. Acute infarct involving the left insula and left frontal operculum. 2. Occlusion of a distal left M2/MCA branch to the area of acute stroke. 3. Severe stenosis at the paraclinoid segment of  the right ICA and mild-to-moderate stenosis at the proximal petrous segment of the left ICA. 4. High-grade stenosis at the distal left P2/PCA. 5. Ectatic appearance of cavernous right ICA with a 3 mm laterally projecting outpouching suggestive of an small (extradural) aneurysm. These results were called by telephone at the time of interpretation on 04/05/2023 at 11:18 am to provider [facility] , who verbally acknowledged these results. Electronically Signed   By: Baldemar Lenis M.D.   On: 04/05/2023 11:20   CT HEAD WO CONTRAST ( )  Result Date: 04/05/2023 CLINICAL DATA:  Aphasia and facial droop. EXAM: CT HEAD WITHOUT CONTRAST TECHNIQUE: Contiguous axial images were obtained from the base of the skull through the vertex without intravenous contrast. RADIATION DOSE REDUCTION: This exam was performed according to the departmental dose-optimization program which includes automated  exposure control, adjustment of the mA and/or kV according to patient size and/or use of iterative reconstruction technique. COMPARISON:  Head CT 12/10/2022 and MRI 10/04/2022 FINDINGS: Brain: There is no evidence of an acute infarct, intracranial hemorrhage, mass, midline shift, or extra-axial fluid collection. Mild cerebral atrophy is within limits for age. Patchy to confluent hypodensities in the cerebral white matter are similar to the prior CT and are nonspecific but compatible with moderate chronic small vessel ischemic disease. Vascular: Calcified atherosclerosis at the skull base. No hyperdense vessel. Skull: No acute fracture or suspicious osseous lesion. Sinuses/Orbits: Chronic right maxillary sinusitis with partially visualized material chronically extending into the nasal cavity. Clear mastoid air cells. Unremarkable included orbits. Other: None. IMPRESSION: 1. No evidence of acute intracranial abnormality. 2. Moderate chronic small vessel ischemic disease. Electronically Signed   By: Sebastian Ache M.D.   On: 04/05/2023 10:07    EKG: Independently reviewed.   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)   Aphasia   Dysphagia   Acute ischemic CVA - appreciate neurology consultation and recommendations - pt started on aspirin and plavix for 3 months followed by plavix monotherapy - TTE pending - US carotid studies pending  - if TTE and carotid US negative plan for 30 day cardiac event monitor - allow  permissive hypertension  - check A1c and LDL - add statin for LDL<70 neuro recommending atorvastatin 40 mg daily  - neuro checks and CVA protocol  - obtain STAT CT head if change in neuro exam  - SLP evaluation requested  - ambulatory referral to neurology placed for discharge   Large abdominal wall hernia - this is chronic - she was referred to outpatient surgery  - hernia is nontender and easily reducible  Essential hypertension  - allow permissive hypertension for 24-48 hours per neuro followed by gradual normotension  2nd degree heart block - pt was bradycardic in ED and was given atropine in ED - HR has improved - admitted to stepdown ICU - continuous telemetry monitoring   Stage 3b CKD  - stable from recent testing - follow  - renally dose medications as able   Dementia - resume home  medications  HIV - resume home antiretroviral   DVT prophylaxis: [place] heparin   Code Status: DNR   Family Communication: son   Disposition Plan: TBD   Consults called: neurology   Admission status: OBV   Level of care: Stepdown Standley Dakins MD [facility] How to contact the [facility] Attending or Consulting provider 7A - 7P or covering provider during after hours 7P -7A, for this patient?  Check the care team in [facility] and look for a) attending/consulting [facility] provider listed and b) the [facility] team listed Log into [email or web address] and use [place]'s universal password to access. If you do not have the password, please contact the hospital operator. Locate the [facility] provider you are looking for under [facility] and page to a number that you can be directly reached. If you still have difficulty reaching the provider, please page the [facility] (Director on Call) for the Hospitalists listed on amion for assistance.   If 7PM-7AM, please contact night-coverage [email or web address] Password [email or web address]  04/05/2023, 2:05 PM

## 2023-04-05 NOTE — Hospital Course (Signed)
89 y/o female with dementia, HTN, HIV on antiretrovirals, stage 3b CKD, chronic large abdominal wall hernia, anemia of chronic disease, LV hypertrophy, history of cervical and uterine cancer, s/p hysterectomy, chronic venous insufficiency, aortic atherosclerosis who was recently hospitalized at [facility] 11/25-11/26 for an episode of painless rectal bleeding and hyperkalemia with AKI.  She recovered from that and the bleeding had stopped and she was discharged back home.  She was brought in today by son from home with concern for CVA.  She was last noted to be normal yesterday at 6pm.  This morning she is aphasic with a left sided facial droop and weakness in her right lower extremity.  Code stroke protocol in the ED and was seen by teleneurologist and MRI confimed acute infarct involving the left insula and left frontal operculum.  Pt has new findings of dysphagia.  Pt is being admitted for full stroke work up per neurology recommendations.

## 2023-04-05 NOTE — Progress Notes (Signed)
*  PRELIMINARY RESULTS* Echocardiogram 2D Echocardiogram has been performed with saline bubble study.  Karen Luna 04/05/2023, 4:11 PM

## 2023-04-05 NOTE — Plan of Care (Signed)

## 2023-04-05 NOTE — Evaluation (Signed)
Clinical/Bedside Swallow Evaluation Patient Details  Name: Karen Luna MRN: [id number] Date of Birth: 1932/04/07  Today's Date: 04/05/2023 Time: SLP Start Time (ACUTE ONLY): 1430 SLP Stop Time (ACUTE ONLY): 1445 SLP Time Calculation (min) (ACUTE ONLY): 15 min  Past Medical History:  Past Medical History:  Diagnosis Date   Anemia of chronic disease 07/06/2011   Aortic atherosclerosis (HCC) 01/06/2015   Seen on CT scan, currently asymptomatic   Cancer (HCC)    uterine( per pt)   Chronic kidney disease, stage 3 (HCC) 09/01/2008   Chronic venous insufficiency 10/03/2012   Essential hypertension 09/19/2006   History of cervical cancer 08/11/2012   s/p hysterectomy, remote, specifics unknown    Human immunodeficiency virus (HIV) disease (HCC) 09/12/2006   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 07/25/2012   Low grade squamous intraepithelial lesion (LGSIL) on cervical Pap smear 08/06/2007   LGSIL: 08/06/2007, 11/05/2008, 05/11/2009 ASC-US: 05/27/2008, 08/31/2009, 05/24/2010, 05/23/2012 High Risk HPV: Detected    Onychomycosis of left great toe 09/09/2015   Overweight (BMI 25.0-29.9) 07/25/2012   Past Surgical History:  Past Surgical History:  Procedure Laterality Date   ABDOMINAL HYSTERECTOMY     HPI:  Karen Luna is a 89 y/o female with dementia, HTN, HIV on antiretrovirals, stage 3b CKD, chronic large abdominal wall hernia, anemia of chronic disease, LV hypertrophy, history of cervical and uterine cancer, s/p hysterectomy, chronic venous insufficiency, aortic atherosclerosis who was recently hospitalized at [facility] 11/25-11/26 for an episode of painless rectal bleeding and hyperkalemia with AKI.  She recovered from that and the bleeding had stopped and she was discharged back home.  She was brought in today by son from home with concern for CVA.  She was last noted to be normal yesterday at 6pm.  This morning she is aphasic with a left sided facial droop and weakness in her right lower  extremity.  Code stroke protocol in the ED and was seen by teleneurologist and MRI confimed acute infarct involving the left insula and left frontal operculum.  Pt has new findings of dysphagia.  Pt is being admitted for full stroke work up per neurology recommendations.    Assessment / Plan / Recommendation  Clinical Impression  Clinical swallowing evaluation completed while Pt was sitting upright in bed. Oral mech reveals no facial asymmetry; note white lingual and oral coating and question oral thrush. Oral care slightly improved color but coating remained - reported to RN. Pt demonstrated immediate coughing with trials of thin liquids with ~50% of trials. Pt demonstrated impulsivity with all PO trials. Pt consumed NTL without incident. Note poor oral awareness and coordination with puree and regular trials with occasional anterior spillage. Regular trials required additional time and verbal cues to be completely masticated and repeat swallows to clear oral residue. Recommend initiate D2/fine chop diet and NECTAR thick liquids. Again note Pt is very impulsive with PO and will require 1:1 supervision for meals. Recommend crush meds and administer in puree. ST will continue to follow acutely. SLP Visit Diagnosis: Dysphagia, oropharyngeal phase (R13.12)    Aspiration Risk  Mild aspiration risk;Moderate aspiration risk    Diet Recommendation Dysphagia 2 (Fine chop);Nectar-thick liquid    Liquid Administration via: Cup Medication Administration: Crushed with puree Supervision: Patient able to self feed Compensations: Minimize environmental distractions;Slow rate;Small sips/bites Postural Changes: Seated upright at 90 degrees    Other  Recommendations Oral Care Recommendations: Oral care QID Caregiver Recommendations: Have oral suction available    Recommendations for follow up therapy  are one component of a multi-disciplinary discharge planning process, led by the attending physician.   Recommendations may be updated based on patient status, additional functional criteria and insurance authorization.  Follow up Recommendations Skilled nursing-short term rehab (<3 hours/day)      Assistance Recommended at Discharge    Functional Status Assessment Patient has had a recent decline in their functional status and demonstrates the ability to make significant improvements in function in a reasonable and predictable amount of time.  Frequency and Duration min 2x/week  1 week       Prognosis Prognosis for improved oropharyngeal function: Fair Barriers to Reach Goals: Cognitive deficits;Language deficits;Severity of deficits      Swallow Study   General Date of Onset: 04/05/23 HPI: Karen Luna is a 89 y/o female with dementia, HTN, HIV on antiretrovirals, stage 3b CKD, chronic large abdominal wall hernia, anemia of chronic disease, LV hypertrophy, history of cervical and uterine cancer, s/p hysterectomy, chronic venous insufficiency, aortic atherosclerosis who was recently hospitalized at [facility] 11/25-11/26 for an episode of painless rectal bleeding and hyperkalemia with AKI.  She recovered from that and the bleeding had stopped and she was discharged back home.  She was brought in today by son from home with concern for CVA.  She was last noted to be normal yesterday at 6pm.  This morning she is aphasic with a left sided facial droop and weakness in her right lower extremity.  Code stroke protocol in the ED and was seen by teleneurologist and MRI confimed acute infarct involving the left insula and left frontal operculum.  Pt has new findings of dysphagia.  Pt is being admitted for full stroke work up per neurology recommendations. Type of Study: Bedside Swallow Evaluation Previous Swallow Assessment: none in chart Diet Prior to this Study: NPO Temperature Spikes Noted: No Respiratory Status: Room air History of Recent Intubation: No Behavior/Cognition: Alert;Pleasant  mood;Confused Oral Cavity Assessment:  (white lingual oral coating) Oral Care Completed by SLP: Recent completion by staff Oral Cavity - Dentition: Adequate natural dentition Vision: Functional for self-feeding Self-Feeding Abilities: Able to feed self Patient Positioning: Upright in bed Baseline Vocal Quality: Normal Volitional Cough: Cognitively unable to elicit Volitional Swallow: Unable to elicit    Oral/Motor/Sensory Function Overall Oral Motor/Sensory Function: Within functional limits (no facial droop noted during my eval)   Ice Chips Ice chips: Within functional limits   Thin Liquid Thin Liquid: Impaired Presentation: Cup;Straw Oral Phase Impairments: Poor awareness of bolus;Reduced lingual movement/coordination Oral Phase Functional Implications: Prolonged oral transit Pharyngeal  Phase Impairments: Wet Vocal Quality;Cough - Immediate;Cough - Delayed;Throat Clearing - Delayed    Nectar Thick Nectar Thick Liquid: Within functional limits   Honey Thick Honey Thick Liquid: Not tested   Puree Puree: Impaired Presentation: Self Fed;Spoon Oral Phase Impairments: Reduced lingual movement/coordination;Poor awareness of bolus;Reduced labial seal Oral Phase Functional Implications: Left anterior spillage;Oral holding;Oral residue   Solid     Solid: Impaired Presentation: Self Fed Oral Phase Impairments: Impaired mastication;Reduced lingual movement/coordination Oral Phase Functional Implications: Prolonged oral transit;Impaired mastication;Oral holding;Oral residue     Agusta Hackenberg H. Romie Levee, CCC-SLP Speech Language Pathologist  Georgetta Haber 04/05/2023,3:06 PM

## 2023-04-05 NOTE — ED Provider Notes (Signed)
[place] EMERGENCY DEPARTMENT AT [facility] Provider Note   CSN: [id number] Arrival date & time: 04/05/23  6213     History  Chief Complaint  Patient presents with   Aphasia    Karen Luna is a 89 y.o. female.  89 year old female with a history of Alzheimer's dementia, HIV on Biktarvy, CKD, and chronic anemia who presents to the emergency department with difficulty speaking.  Patient was last seen well at her home by her son at 6 PM last night.  Says that he checked on her this morning at 6:50 AM and she was not speaking.  Also appeared to be drooling.  No history of stroke.  No blood thinner use.       Home Medications Prior to Admission medications   Medication Sig Start Date End Date Taking? Authorizing Provider  acetaminophen (TYLENOL) 650 MG CR tablet Take 650 mg by mouth 3 (three) times daily as needed for pain.   Yes [provider]  aspirin EC 81 MG tablet Take 81 mg by mouth daily. Swallow whole.   Yes [provider]  amLODipine (NORVASC) 10 MG tablet Take 10 mg by mouth daily.    [provider]  bictegravir-emtricitabine-tenofovir AF (BIKTARVY) 50-200-25 MG TABS tablet Take 1 tablet by mouth daily. 06/26/22   Gardiner Barefoot, MD  Cholecalciferol (VITAMIN D3) 20 MCG (800 UNIT) TABS Take 1 tablet by mouth daily.    [provider]  citalopram (CELEXA) 20 MG tablet Take 20 mg by mouth daily.     [provider]  doxazosin (CARDURA) 2 MG tablet Take 1 tablet (2 mg total) by mouth daily. 03/26/23 03/25/24  Dorcas Carrow, MD  Ferrous Sulfate (IRON PO) Take 45 mg by mouth daily.    [provider]  fexofenadine (ALLEGRA) 60 MG tablet Take 60 mg by mouth 2 (two) times daily.    [provider]  hydrALAZINE (APRESOLINE) 100 MG tablet Take 1 tablet (100 mg total) by mouth 3 (three) times daily. 09/09/15   Doneen Poisson, MD  Memantine HCl-Donepezil HCl 21-10 MG CP24 Take 1 capsule by mouth daily.     [provider]  Multiple Vitamins-Minerals (PRESERVISION AREDS 2) CAPS Take 1 capsule by mouth daily.    [provider]      Allergies    Clonidine derivatives    Review of Systems   Review of Systems  Physical Exam Updated Vital Signs BP (!) 166/54   Pulse (!) 57   Temp 98.7 F (37.1 C) (Oral)   Resp (!) 24   Ht 5\' 8"  (1.727 m)   Wt 53.7 kg   SpO2 99%   BMI 18.00 kg/m  Physical Exam Vitals and nursing note reviewed.  Constitutional:      General: She is not in acute distress.    Appearance: She is well-developed.     Comments: Will track but is aphasic  HENT:     Head: Normocephalic and atraumatic.     Right Ear: External ear normal.     Left Ear: External ear normal.     Nose: Nose normal.  Eyes:     Extraocular Movements: Extraocular movements intact.     Conjunctiva/sclera: Conjunctivae normal.     Pupils: Pupils are equal, round, and reactive to light.  Cardiovascular:     Rate and Rhythm: Normal rate and regular rhythm.     Heart sounds: Murmur heard.  Pulmonary:     Effort: Pulmonary effort is normal.  No respiratory distress.     Breath sounds: Normal breath sounds.  Musculoskeletal:     Cervical back: Normal range of motion and neck supple.     Right lower leg: No edema.     Left lower leg: No edema.  Skin:    General: Skin is warm and dry.  Neurological:     Mental Status: She is alert.     Comments: Level of Consciousness: Alert  LOC Questions: Aphasic Best Gaze: Horizontal ocular movements intact  Visual Fields: No visual field loss -blinks to confrontation Facial Palsy: Difficult to assess because she is having difficulty following commands but does appear to have mild right facial droop L Upper Extremity Motor: No drift after 10 seconds  R Upper Extremity Motor: No drift after 10 seconds  L Lower extremity Motor: No drift after 5 seconds  R Lower extremity Motor: No drift after 5 seconds  Best Language: aphasia  Neglect:  Does not appear to be showing visual neglect   Psychiatric:        Mood and Affect: Mood normal.     ED Results / Procedures / Treatments   Labs (all labs ordered are listed, but only abnormal results are displayed) Labs Reviewed  CBC - Abnormal; Notable for the following components:      Result Value   RBC 2.39 (*)    Hemoglobin 8.6 (*)    HCT 26.6 (*)    MCV 111.3 (*)    MCH 36.0 (*)    All other components within normal limits  COMPREHENSIVE METABOLIC PANEL - Abnormal; Notable for the following components:   BUN 34 (*)    Creatinine, Ser 1.73 (*)    GFR, Estimated 28 (*)    All other components within normal limits  URINALYSIS, ROUTINE W REFLEX MICROSCOPIC - Abnormal; Notable for the following components:   APPearance CLOUDY (*)    Protein, ur 100 (*)    Bacteria, UA RARE (*)    All other components within normal limits  I-STAT CHEM 8, ED - Abnormal; Notable for the following components:   BUN 31 (*)    Creatinine, Ser 1.90 (*)    Hemoglobin 9.5 (*)    HCT 28.0 (*)    All other components within normal limits  MRSA NEXT GEN BY PCR, NASAL  ETHANOL  DIFFERENTIAL  RAPID URINE DRUG SCREEN, HOSP PERFORMED  LIPID PANEL  HEMOGLOBIN A1C  LIPID PANEL  CBC  CBG MONITORING, ED    EKG EKG Interpretation Date/Time:  Friday April 05 2023 11:51:29 EST Ventricular Rate:  39 PR Interval:  118 QRS Duration:  80 QT Interval:  435 QTC Calculation: 351 R Axis:   76  Text Interpretation: Sinus rhythm with wenkebach Atrial premature complexes Borderline short PR interval Left ventricular hypertrophy Repol abnrm suggests ischemia, anterolateral Confirmed by Vonita Moss [phone]) on 04/05/2023 4:22:44 PM  Radiology ECHOCARDIOGRAM COMPLETE BUBBLE STUDY  Result Date: 04/05/2023    ECHOCARDIOGRAM REPORT   Patient Name:   Karen Luna Date of Exam: 04/05/2023 Medical Rec #:  [id number]        Height:       68.0 in Accession #:    [id number]       Weight:       118.4 lb Date  of Birth:  April 17, 1932        BSA:          1.635 m Patient Age:    25 years  BP:           168/59 mmHg Patient Gender: F                HR:           63 bpm. [facility] Procedure: 2D Echo, Cardiac Doppler and Color Doppler Indications:    Stroke 434.91 / I63.9  History:        Patient has prior history of Echocardiogram examinations. Risk                 Factors:Hypertension and Former Smoker.  Sonographer:    Celesta Gentile RCS Referring Phys: [id number] PRIYANKA O YADAV IMPRESSIONS  1. Left ventricular ejection fraction, by estimation, is 65 to 70%. The left ventricle has normal function. The left ventricle has no regional wall motion abnormalities. There is mild concentric left ventricular hypertrophy. Left ventricular diastolic parameters are indeterminate.  2. Right ventricular systolic function is normal. The right ventricular size is normal. There is mildly elevated pulmonary artery systolic pressure. The estimated right ventricular systolic pressure is 37.1 mmHg.  3. Left atrial size was severely dilated.  4. Right atrial size was moderately dilated.  5. The mitral valve is degenerative. Mild mitral valve regurgitation.  6. Tricuspid valve regurgitation is mild to moderate.  7. The aortic valve is tricuspid. There is mild calcification of the aortic valve. Aortic valve regurgitation is not visualized. Aortic valve sclerosis/calcification is present, without any evidence of aortic stenosis. Aortic valve mean gradient measures 8.0 mmHg. Dimentionless index 0.72.  8. The inferior vena cava is normal in size with greater than 50% respiratory variability, suggesting right atrial pressure of 3 mmHg.  9. Agitated saline contrast bubble study was negative, with no evidence of any interatrial shunt. Comparison(s): No prior Echocardiogram. FINDINGS  Left Ventricle: Left ventricular ejection fraction, by estimation, is 65 to 70%. The left ventricle has normal function. The left ventricle has no  regional wall motion abnormalities. The left ventricular internal cavity size was normal in size. There is  mild concentric left ventricular hypertrophy. Left ventricular diastolic function could not be evaluated due to atrial fibrillation. Left ventricular diastolic parameters are indeterminate. Right Ventricle: The right ventricular size is normal. No increase in right ventricular wall thickness. Right ventricular systolic function is normal. There is mildly elevated pulmonary artery systolic pressure. The tricuspid regurgitant velocity is 2.92  m/s, and with an assumed right atrial pressure of 3 mmHg, the estimated right ventricular systolic pressure is 37.1 mmHg. Left Atrium: Left atrial size was severely dilated. Right Atrium: Right atrial size was moderately dilated. Pericardium: Trivial pericardial effusion is present. The pericardial effusion is posterior to the left ventricle. Mitral Valve: The mitral valve is degenerative in appearance. Mild mitral valve regurgitation. Tricuspid Valve: The tricuspid valve is grossly normal. Tricuspid valve regurgitation is mild to moderate. Aortic Valve: The aortic valve is tricuspid. There is mild calcification of the aortic valve. There is mild aortic valve annular calcification. Aortic valve regurgitation is not visualized. Aortic valve sclerosis/calcification is present, without any evidence of aortic stenosis. Aortic valve mean gradient measures 8.0 mmHg. Aortic valve peak gradient measures 17.6 mmHg. Aortic valve area, by VTI measures 1.83 cm. Pulmonic Valve: The pulmonic valve was grossly normal. Pulmonic valve regurgitation is trivial. Aorta: The aortic root is normal in size and structure. Venous: The inferior vena cava is normal in size with greater than 50% respiratory variability, suggesting right atrial pressure of 3 mmHg. IAS/Shunts: No atrial level  shunt detected by color flow Doppler. Agitated saline contrast was given intravenously to evaluate for  intracardiac shunting. Agitated saline contrast bubble study was negative, with no evidence of any interatrial shunt.  LEFT VENTRICLE PLAX 2D LVIDd:         5.00 cm LVIDs:         3.00 cm LV PW:         1.40 cm LV IVS:        1.10 cm LVOT diam:     1.80 cm LV SV:         81 LV SV Index:   50 LVOT Area:     2.54 cm  RIGHT VENTRICLE TAPSE (M-mode): 2.8 cm LEFT ATRIUM              Index        RIGHT ATRIUM           Index LA diam:        4.60 cm  2.81 cm/m   RA Area:     23.20 cm LA Vol (A2C):   149.0 ml 91.11 ml/m  RA Volume:   74.00 ml  45.25 ml/m LA Vol (A4C):   153.0 ml 93.55 ml/m LA Biplane Vol: 152.0 ml 92.94 ml/m  AORTIC VALVE AV Area (Vmax):    1.81 cm AV Area (Vmean):   1.84 cm AV Area (VTI):     1.83 cm AV Vmax:           210.00 cm/s AV Vmean:          121.000 cm/s AV VTI:            0.444 m AV Peak Grad:      17.6 mmHg AV Mean Grad:      8.0 mmHg LVOT Vmax:         149.00 cm/s LVOT Vmean:        87.300 cm/s LVOT VTI:          0.319 m LVOT/AV VTI ratio: 0.72  AORTA Ao Root diam: 2.70 cm MITRAL VALVE                TRICUSPID VALVE MV Area (PHT): 4.26 cm     TR Peak grad:   34.1 mmHg MV Decel Time: 178 msec     TR Vmax:        292.00 cm/s MV E velocity: 145.50 cm/s MV A velocity: 62.40 cm/s   SHUNTS MV E/A ratio:  2.33         Systemic VTI:  0.32 m                             Systemic Diam: 1.80 cm Nona Dell MD Electronically signed by Nona Dell MD Signature Date/Time: 04/05/2023/4:19:33 PM    Final    US Carotid Bilateral  Result Date: 04/05/2023 CLINICAL DATA:  89 year old female with a history of stroke and left-sided facial droop EXAM: BILATERAL CAROTID DUPLEX ULTRASOUND TECHNIQUE: Wallace Cullens scale imaging, color Doppler and duplex ultrasound were performed of bilateral carotid and vertebral arteries in the neck. COMPARISON:  None Available. FINDINGS: Criteria: Quantification of carotid stenosis is based on velocity parameters that correlate the residual internal carotid diameter with  NASCET-based stenosis levels, using the diameter of the distal internal carotid lumen as the denominator for stenosis measurement. The following velocity measurements were obtained: RIGHT ICA:  Systolic 174 cm/sec, Diastolic 43 cm/sec CCA:  108 cm/sec SYSTOLIC ICA/CCA  RATIO:  1.6 ECA:  92 cm/sec LEFT ICA:  Systolic 101 cm/sec, Diastolic 22 cm/sec CCA:  92 cm/sec SYSTOLIC ICA/CCA RATIO:  1.1 ECA:  103 cm/sec Right Brachial SBP: Not acquired Left Brachial SBP: Not acquired RIGHT CAROTID ARTERY: No significant calcifications of the right common carotid artery. Intermediate waveform maintained. Moderate heterogeneous and partially calcified plaque at the right carotid bifurcation. Shadowing present. Low resistance waveform of the right ICA. Tortuosity RIGHT VERTEBRAL ARTERY: Antegrade flow with low resistance waveform. LEFT CAROTID ARTERY: No significant calcifications of the left common carotid artery. Intermediate waveform maintained. Moderate heterogeneous and partially calcified plaque at the left carotid bifurcation. Shadowing present. Low resistance waveform of the left ICA. Tortuosity LEFT VERTEBRAL ARTERY:  Antegrade flow with low resistance waveform. IMPRESSION: Right: Heterogeneous and partially calcified plaque at the right carotid bifurcation contributes to 50%-69% stenosis by established duplex criteria. Left: Color duplex indicates moderate heterogeneous and calcified plaque, with no hemodynamically significant stenosis by duplex criteria in the extracranial cerebrovascular circulation. Signed, Yvone Neu. Miachel Roux, RPVI [facility] Rad[facility] Electronically Signed   By: Gilmer Mor D.O.   On: 04/05/2023 13:35   MR BRAIN WO CONTRAST  Result Date: 04/05/2023 CLINICAL DATA:  Aphasia. EXAM: MRI HEAD WITHOUT CONTRAST MRA HEAD WITHOUT CONTRAST TECHNIQUE: Multiplanar, multi-echo pulse sequences of the brain and surrounding structures were acquired  without intravenous contrast. Angiographic images of the Circle of Willis were acquired using MRA technique without intravenous contrast. COMPARISON:  Head CT April 05, 2023. FINDINGS: MRI HEAD FINDINGS Brain: Area of restricted diffusion involving the left insula and frontal operculum with corresponding faint T2 hyperintensity, consistent with acute infarct. No hemorrhage, hydrocephalus, mass or extra-axial collection. Scattered and confluent foci of T2 hyperintensity are seen within the white matter of the cerebral hemispheres nonspecific, likely related chronic small vessel ischemia. Moderate parenchymal volume loss. Vascular: The major flow voids at the skull base are maintained. Skull and upper cervical spine: Degenerative changes of the cervical spine with mild anterolisthesis of C2 over C3. No focal marrow lesion. Sinuses/Orbits: Chronic opacification of the right maxillary sinus. The orbits are maintained. Other: None. MRA HEAD FINDINGS Anterior circulation: Patent bilateral intracranial internal carotid arteries with severe stenosis at the paraclinoid segment of the right ICA and mild-to-moderate stenosis at the proximal petrous segment of the left ICA. Ectatic appearance of cavernous right ICA with a 3 mm laterally projecting outpouching suggestive of an small (extradural) aneurysm. The bilateral M1 segments are patent. Occlusion of a distal left M2/MCA branch to the area of acute stroke. Posterior circulation: The bilateral intracranial vertebral arteries and basilar artery are patent without evidence of high-grade stenosis. The right posterior cerebral artery has normal flow related enhancement. There is a high-grade stenosis at the distal left P2/PCA. Anatomic variants: None significant. IMPRESSION: 1. Acute infarct involving the left insula and left frontal operculum. 2. Occlusion of a distal left M2/MCA branch to the area of acute stroke. 3. Severe stenosis at the paraclinoid segment of the right  ICA and mild-to-moderate stenosis at the proximal petrous segment of the left ICA. 4. High-grade stenosis at the distal left P2/PCA. 5. Ectatic appearance of cavernous right ICA with a 3 mm laterally projecting outpouching suggestive of an small (extradural) aneurysm. These results were called by telephone at the time of interpretation on 04/05/2023 at 11:18 am to provider [facility] , who verbally acknowledged these results. Electronically Signed   By: Baldemar Lenis M.D.   On:  04/05/2023 11:20   MR ANGIO HEAD WO CONTRAST  Result Date: 04/05/2023 CLINICAL DATA:  Aphasia. EXAM: MRI HEAD WITHOUT CONTRAST MRA HEAD WITHOUT CONTRAST TECHNIQUE: Multiplanar, multi-echo pulse sequences of the brain and surrounding structures were acquired without intravenous contrast. Angiographic images of the Circle of Willis were acquired using MRA technique without intravenous contrast. COMPARISON:  Head CT April 05, 2023. FINDINGS: MRI HEAD FINDINGS Brain: Area of restricted diffusion involving the left insula and frontal operculum with corresponding faint T2 hyperintensity, consistent with acute infarct. No hemorrhage, hydrocephalus, mass or extra-axial collection. Scattered and confluent foci of T2 hyperintensity are seen within the white matter of the cerebral hemispheres nonspecific, likely related chronic small vessel ischemia. Moderate parenchymal volume loss. Vascular: The major flow voids at the skull base are maintained. Skull and upper cervical spine: Degenerative changes of the cervical spine with mild anterolisthesis of C2 over C3. No focal marrow lesion. Sinuses/Orbits: Chronic opacification of the right maxillary sinus. The orbits are maintained. Other: None. MRA HEAD FINDINGS Anterior circulation: Patent bilateral intracranial internal carotid arteries with severe stenosis at the paraclinoid segment of the right ICA and mild-to-moderate stenosis at the proximal petrous segment of the left ICA.  Ectatic appearance of cavernous right ICA with a 3 mm laterally projecting outpouching suggestive of an small (extradural) aneurysm. The bilateral M1 segments are patent. Occlusion of a distal left M2/MCA branch to the area of acute stroke. Posterior circulation: The bilateral intracranial vertebral arteries and basilar artery are patent without evidence of high-grade stenosis. The right posterior cerebral artery has normal flow related enhancement. There is a high-grade stenosis at the distal left P2/PCA. Anatomic variants: None significant. IMPRESSION: 1. Acute infarct involving the left insula and left frontal operculum. 2. Occlusion of a distal left M2/MCA branch to the area of acute stroke. 3. Severe stenosis at the paraclinoid segment of the right ICA and mild-to-moderate stenosis at the proximal petrous segment of the left ICA. 4. High-grade stenosis at the distal left P2/PCA. 5. Ectatic appearance of cavernous right ICA with a 3 mm laterally projecting outpouching suggestive of an small (extradural) aneurysm. These results were called by telephone at the time of interpretation on 04/05/2023 at 11:18 am to provider [facility] , who verbally acknowledged these results. Electronically Signed   By: Baldemar Lenis M.D.   On: 04/05/2023 11:20   CT HEAD WO CONTRAST ( )  Result Date: 04/05/2023 CLINICAL DATA:  Aphasia and facial droop. EXAM: CT HEAD WITHOUT CONTRAST TECHNIQUE: Contiguous axial images were obtained from the base of the skull through the vertex without intravenous contrast. RADIATION DOSE REDUCTION: This exam was performed according to the departmental dose-optimization program which includes automated exposure control, adjustment of the mA and/or kV according to patient size and/or use of iterative reconstruction technique. COMPARISON:  Head CT 12/10/2022 and MRI 10/04/2022 FINDINGS: Brain: There is no evidence of an acute infarct, intracranial hemorrhage, mass, midline  shift, or extra-axial fluid collection. Mild cerebral atrophy is within limits for age. Patchy to confluent hypodensities in the cerebral white matter are similar to the prior CT and are nonspecific but compatible with moderate chronic small vessel ischemic disease. Vascular: Calcified atherosclerosis at the skull base. No hyperdense vessel. Skull: No acute fracture or suspicious osseous lesion. Sinuses/Orbits: Chronic right maxillary sinusitis with partially visualized material chronically extending into the nasal cavity. Clear mastoid air cells. Unremarkable included orbits. Other: None. IMPRESSION: 1. No evidence of acute intracranial abnormality. 2. Moderate chronic small vessel ischemic disease. Electronically  Signed   By: Sebastian Ache M.D.   On: 04/05/2023 10:07    Procedures Procedures    Medications Ordered in ED Medications  aspirin chewable tablet 81 mg (has no administration in time range)  clopidogrel (PLAVIX) tablet 75 mg (75 mg Oral Given 04/05/23 1252)   stroke: early stages of recovery book (has no administration in time range)  0.9 %  sodium chloride infusion ( Intravenous Infusion Verify 04/05/23 1538)  acetaminophen (TYLENOL) tablet 650 mg (has no administration in time range)    Or  acetaminophen (TYLENOL) 160 MG/5ML solution 650 mg (has no administration in time range)    Or  acetaminophen (TYLENOL) suppository 650 mg (has no administration in time range)  senna-docusate (Senokot-S) tablet 1 tablet (has no administration in time range)  heparin injection 5,000 Units (has no administration in time range)  Chlorhexidine Gluconate Cloth 2 % PADS 6 each (6 each Topical Given 04/05/23 1403)  atropine injection 0.5 mg (has no administration in time range)  bictegravir-emtricitabine-tenofovir AF (BIKTARVY) 50-200-25 MG per tablet 1 tablet ( Oral Canceled Entry 04/05/23 1519)  citalopram (CELEXA) tablet 20 mg (has no administration in time range)  memantine (NAMENDA XR) 24 hr capsule  21 mg (has no administration in time range)    And  donepezil (ARICEPT) tablet 10 mg (has no administration in time range)  fluconazole (DIFLUCAN) tablet 100 mg (100 mg Oral Given 04/05/23 1634)  perflutren lipid microspheres (DEFINITY) IV suspension (has no administration in time range)  atropine injection 0.5 mg (0.5 mg Intravenous Given 04/05/23 1206)  aspirin tablet 325 mg (325 mg Oral Given 04/05/23 1251)    ED Course/ Medical Decision Making/ A&P Clinical Course as of 04/05/23 1947  Fri Apr 05, 2023  1055 Dr Melynda Ripple from neurology will review the imaging and get back to [place] on recommendations. [RP]  1117 Called by radiology has L frontal stroke with M2 occlusion. Neuro updated. [RP]    Clinical Course User Index [RP] Rondel Baton, MD                                 Medical Decision Making Amount and/or Complexity of Data Reviewed Labs: ordered. Radiology: ordered.  Risk Prescription drug management. Decision regarding hospitalization.   SAILEE CRUMB is a 89 y.o. female with comorbidities that complicate the patient evaluation including Alzheimer's dementia, HIV on Biktarvy, CKD, and chronic anemia who presents to the emergency department with difficulty speaking.   Initial Ddx:  Stroke, LVO, ICH, delirium/encephalopathy  MDM/Course:  Patient presents to the emergency department with difficulty speaking.  On exam does appear to be aphasic.  No arm weakness so is Zenaida Niece negative so not a candidate for code stroke activation for LVO.  Unfortunately at this time is outside of the window for TNK as well.  She had a CT of the head which did not reveal acute abnormalities.  MRI did show M2 occlusion with acute stroke.  Discussed with neurology who recommended admission to [facility] for further evaluation of her stroke.  Of note, patient did temporarily go into Mobitz 1 heart block this resolved after some atropine.  Patient also passed a swallow study initially but did have some  difficulty taking the aspirin and Plavix so would benefit from being n.p.o. and SLP evaluation.  This patient presents to the ED for concern of complaints listed in HPI, this involves an extensive number of treatment options,  and is a complaint that carries with it a high risk of complications and morbidity. Disposition including potential need for admission considered.   Dispo: Admit to Floor  Additional history obtained from son Records reviewed Outpatient Clinic Notes The following labs were independently interpreted: Chemistry and show CKD I independently reviewed the following imaging with scope of interpretation limited to determining acute life threatening conditions related to emergency care: CT Head and agree with the radiologist interpretation with the following exceptions: none I personally reviewed and interpreted cardiac monitoring:  Mobitz type I heart block, NSR I personally reviewed and interpreted the pt's EKG: see above for interpretation  I have reviewed the patients home medications and made adjustments as needed Consults: Neurology Social Determinants of health:  Elderly  Portions of this note were generated with Scientist, clinical (histocompatibility and immunogenetics). Dictation errors may occur despite best attempts at proofreading.    CRITICAL CARE Performed by: Rondel Baton   Total critical care time: 45 minutes  Critical care time was exclusive of separately billable procedures and treating other patients.  Critical care was necessary to treat or prevent imminent or life-threatening deterioration.  Critical care was time spent personally by me on the following activities: development of treatment plan with patient and/or surrogate as well as nursing, discussions with consultants, evaluation of patient's response to treatment, examination of patient, obtaining history from patient or surrogate, ordering and performing treatments and interventions, ordering and review of laboratory  studies, ordering and review of radiographic studies, pulse oximetry and re-evaluation of patient's condition.   Final Clinical Impression(s) / ED Diagnoses Final diagnoses:  Cerebrovascular accident (CVA) due to embolism of middle cerebral artery, unspecified blood vessel laterality (HCC)  Aphasia    Rx / DC Orders ED Discharge Orders          Ordered    Ambulatory referral to Neurology       Comments: An appointment is requested in approximately: 8-12 weeks   04/05/23 1202              Rondel Baton, MD 04/05/23 [phone]

## 2023-04-05 NOTE — Plan of Care (Signed)

## 2023-04-05 NOTE — ED Triage Notes (Signed)
Pt BIB son for possible stroke. Pt normally A & O times 4. Son states pt takes care of herself and that he last saw her normal at 1800. This morning pt is aphasic and has left side facial droop. EDP at bedside during triage.

## 2023-04-05 NOTE — Progress Notes (Addendum)
As per order, Stroke education booklet given to patient and pt.'s son whom is at bedside. Nurse provided detail education to patient regarding stroke. Pt.'s son verbalized understating with no further questions at this time. Pt. Nodded yes in understanding to education. No further questions from the pt. Or the pt.'s son at this time.

## 2023-04-06 ENCOUNTER — Observation Stay (HOSPITAL_COMMUNITY): Payer: Medicare (Managed Care)

## 2023-04-06 DIAGNOSIS — I700 Atherosclerosis of aorta: Secondary | ICD-10-CM | POA: Diagnosis present

## 2023-04-06 DIAGNOSIS — Z7189 Other specified counseling: Secondary | ICD-10-CM | POA: Diagnosis not present

## 2023-04-06 DIAGNOSIS — Z66 Do not resuscitate: Secondary | ICD-10-CM | POA: Diagnosis present

## 2023-04-06 DIAGNOSIS — D631 Anemia in chronic kidney disease: Secondary | ICD-10-CM | POA: Diagnosis present

## 2023-04-06 DIAGNOSIS — R29703 NIHSS score 3: Secondary | ICD-10-CM | POA: Diagnosis not present

## 2023-04-06 DIAGNOSIS — F0283 Dementia in other diseases classified elsewhere, unspecified severity, with mood disturbance: Secondary | ICD-10-CM | POA: Diagnosis present

## 2023-04-06 DIAGNOSIS — I6389 Other cerebral infarction: Secondary | ICD-10-CM | POA: Diagnosis not present

## 2023-04-06 DIAGNOSIS — B20 Human immunodeficiency virus [HIV] disease: Secondary | ICD-10-CM | POA: Diagnosis present

## 2023-04-06 DIAGNOSIS — I63412 Cerebral infarction due to embolism of left middle cerebral artery: Secondary | ICD-10-CM | POA: Diagnosis present

## 2023-04-06 DIAGNOSIS — Z8541 Personal history of malignant neoplasm of cervix uteri: Secondary | ICD-10-CM

## 2023-04-06 DIAGNOSIS — I6521 Occlusion and stenosis of right carotid artery: Secondary | ICD-10-CM | POA: Diagnosis present

## 2023-04-06 DIAGNOSIS — R29709 NIHSS score 9: Secondary | ICD-10-CM | POA: Diagnosis not present

## 2023-04-06 DIAGNOSIS — Z681 Body mass index (BMI) 19 or less, adult: Secondary | ICD-10-CM | POA: Diagnosis not present

## 2023-04-06 DIAGNOSIS — R29704 NIHSS score 4: Secondary | ICD-10-CM | POA: Diagnosis not present

## 2023-04-06 DIAGNOSIS — Z515 Encounter for palliative care: Secondary | ICD-10-CM | POA: Diagnosis not present

## 2023-04-06 DIAGNOSIS — R29708 NIHSS score 8: Secondary | ICD-10-CM | POA: Diagnosis present

## 2023-04-06 DIAGNOSIS — R29810 Facial weakness: Secondary | ICD-10-CM | POA: Diagnosis present

## 2023-04-06 DIAGNOSIS — I1310 Hypertensive heart and chronic kidney disease without heart failure, with stage 1 through stage 4 chronic kidney disease, or unspecified chronic kidney disease: Secondary | ICD-10-CM | POA: Diagnosis present

## 2023-04-06 DIAGNOSIS — N1832 Chronic kidney disease, stage 3b: Secondary | ICD-10-CM | POA: Diagnosis present

## 2023-04-06 DIAGNOSIS — I639 Cerebral infarction, unspecified: Secondary | ICD-10-CM | POA: Diagnosis not present

## 2023-04-06 DIAGNOSIS — B370 Candidal stomatitis: Secondary | ICD-10-CM | POA: Diagnosis present

## 2023-04-06 DIAGNOSIS — R1310 Dysphagia, unspecified: Secondary | ICD-10-CM | POA: Diagnosis present

## 2023-04-06 DIAGNOSIS — Z8249 Family history of ischemic heart disease and other diseases of the circulatory system: Secondary | ICD-10-CM | POA: Diagnosis not present

## 2023-04-06 DIAGNOSIS — R29706 NIHSS score 6: Secondary | ICD-10-CM | POA: Diagnosis not present

## 2023-04-06 DIAGNOSIS — D638 Anemia in other chronic diseases classified elsewhere: Secondary | ICD-10-CM | POA: Diagnosis present

## 2023-04-06 DIAGNOSIS — G309 Alzheimer's disease, unspecified: Secondary | ICD-10-CM | POA: Diagnosis present

## 2023-04-06 DIAGNOSIS — F32A Depression, unspecified: Secondary | ICD-10-CM | POA: Diagnosis present

## 2023-04-06 DIAGNOSIS — R001 Bradycardia, unspecified: Secondary | ICD-10-CM | POA: Diagnosis not present

## 2023-04-06 DIAGNOSIS — Z79899 Other long term (current) drug therapy: Secondary | ICD-10-CM | POA: Diagnosis not present

## 2023-04-06 DIAGNOSIS — I441 Atrioventricular block, second degree: Secondary | ICD-10-CM | POA: Diagnosis present

## 2023-04-06 DIAGNOSIS — R4701 Aphasia: Secondary | ICD-10-CM | POA: Diagnosis present

## 2023-04-06 DIAGNOSIS — F0390 Unspecified dementia without behavioral disturbance: Secondary | ICD-10-CM | POA: Diagnosis not present

## 2023-04-06 LAB — LIPID PANEL
Cholesterol: 147 mg/dL (ref 0–200)
HDL: 75 mg/dL (ref >40)
LDL Cholesterol: 61 mg/dL (ref 0–99)
Total CHOL/HDL Ratio: 2 {ratio}
Triglycerides: 55 mg/dL (ref <150)
VLDL: 11 mg/dL (ref 0–40)

## 2023-04-06 LAB — CBC
HCT: 25.6 % — ABNORMAL LOW (ref 36.0–46.0)
Hemoglobin: 8.4 g/dL — ABNORMAL LOW (ref 12.0–15.0)
MCH: 36.4 pg — ABNORMAL HIGH (ref 26.0–34.0)
MCHC: 32.8 g/dL (ref 30.0–36.0)
MCV: 110.8 fL — ABNORMAL HIGH (ref 80.0–100.0)
Platelets: 330 10*3/uL (ref 150–400)
RBC: 2.31 MIL/uL — ABNORMAL LOW (ref 3.87–5.11)
RDW: 13.1 % (ref 11.5–15.5)
WBC: 11.3 10*3/uL — ABNORMAL HIGH (ref 4.0–10.5)
nRBC: 0 % (ref 0.0–0.2)

## 2023-04-06 NOTE — Plan of Care (Signed)
  Problem: Acute Rehab PT Goals(only PT should resolve) Goal: Pt will Roll Supine to Side Outcome: Progressing Flowsheets (Taken 04/06/2023 1357) Pt will Roll Supine to Side:  Independently  with contact guard assist Goal: Patient Will Transfer Sit To/From Stand Outcome: Progressing Flowsheets (Taken 04/06/2023 1357) Patient will transfer sit to/from stand:  Independently  with modified independence Goal: Pt Will Transfer Bed To Chair/Chair To Bed Outcome: Progressing Flowsheets (Taken 04/06/2023 1357) Pt will Transfer Bed to Chair/Chair to Bed:  Independently  with modified independence Goal: Pt Will Ambulate Outcome: Progressing Flowsheets (Taken 04/06/2023 1357) Pt will Ambulate:  > 125 feet  with modified independence  with least restrictive assistive device   1:58 PM, 04/06/23 Ocie Bob, MPT Physical Therapist with [facility] [phone] office [phone] mobile phone

## 2023-04-06 NOTE — Evaluation (Signed)
Physical Therapy Evaluation Patient Details Name: Karen Luna MRN: [id number] DOB: 01/19/1932 Today's Date: 04/06/2023  History of Present Illness  Karen Luna is a 89 y/o female with dementia, HTN, HIV on antiretrovirals, stage 3b CKD, chronic large abdominal wall hernia, anemia of chronic disease, LV hypertrophy, history of cervical and uterine cancer, s/p hysterectomy, chronic venous insufficiency, aortic atherosclerosis who was recently hospitalized at [facility] 11/25-11/26 for an episode of painless rectal bleeding and hyperkalemia with AKI.  She recovered from that and the bleeding had stopped and she was discharged back home.  She was brought in today by son from home with concern for CVA.  She was last noted to be normal yesterday at 6pm.  This morning she is aphasic with a left sided facial droop and weakness in her right lower extremity.  Code stroke protocol in the ED and was seen by teleneurologist and MRI confimed acute infarct involving the left insula and left frontal operculum.  Pt has new findings of dysphagia.  Pt is being admitted for full stroke work up per neurology recommendations.   Clinical Impression  Patient functioning near baseline for functional mobility and gait other presenting with expressive aphasia and slightly decreased ankle dorsiflexion during gait training.  Patient demonstrates good return for transferring to/from commode and chair and able to ambulate in room/hallways without loss of balance or need for an AD.  Patient tolerated sitting up in chair after therapy - RN notified.  Patient will benefit from continued skilled physical therapy in hospital and recommended venue below to increase strength, balance, endurance for safe ADLs and gait.           If plan is discharge home, recommend the following: Assistance with cooking/housework;A little help with bathing/dressing/bathroom;Help with stairs or ramp for entrance   Can travel by private vehicle         Equipment Recommendations None recommended by PT  Recommendations for Other Services       Functional Status Assessment Patient has had a recent decline in their functional status and demonstrates the ability to make significant improvements in function in a reasonable and predictable amount of time.     Precautions / Restrictions Precautions Precautions: None Restrictions Weight Bearing Restrictions: No      Mobility  Bed Mobility Overal bed mobility: Needs Assistance Bed Mobility: Supine to Sit     Supine to sit: Supervision          Transfers Overall transfer level: Needs assistance Equipment used: None Transfers: Sit to/from Stand, Bed to chair/wheelchair/BSC Sit to Stand: Supervision   Step pivot transfers: Supervision       General transfer comment: slightly labored movement without loss of balance, requires occasional repeated verbal cueing possbily due to mild global aphasia    Ambulation/Gait Ambulation/Gait assistance: Supervision Gait Distance (Feet): 100 Feet Assistive device: None Gait Pattern/deviations: Decreased step length - right, Decreased step length - left, Decreased stride length Gait velocity: decreased     General Gait Details: slightly labored cadence without loss of balance or need for an AD  Stairs            Wheelchair Mobility     Tilt Bed    Modified Rankin (Stroke Patients Only)       Balance Overall balance assessment: Needs assistance Sitting-balance support: Feet supported, No upper extremity supported Sitting balance-Leahy Scale: Good Sitting balance - Comments: seated at EOB   Standing balance support: During functional activity, No upper extremity supported Standing balance-Leahy  Scale: Fair Standing balance comment: fair/good without AD                             Pertinent Vitals/Pain Pain Assessment Pain Assessment: No/denies pain    Home Living Family/patient expects to be  discharged to:: Private residence Living Arrangements: Alone Available Help at Discharge: Family;Available PRN/intermittently Type of Home: Apartment Home Access: Stairs to enter Entrance Stairs-Rails: Right;Left;Can reach both Entrance Stairs-Number of Steps: 3   Home Layout: One level Home Equipment: Grab bars - tub/shower      Prior Function Prior Level of Function : Needs assist       Physical Assist : Mobility (physical);ADLs (physical) Mobility (physical): Bed mobility;Transfers;Gait;Stairs   Mobility Comments: Household and short distanced community ambulation without AD ADLs Comments: Assisted by family, pace program 4 hours/day on MWF, home aides 2 hours/day on Tuesday, Thursday     Extremity/Trunk Assessment   Upper Extremity Assessment Upper Extremity Assessment: Defer to OT evaluation    Lower Extremity Assessment Lower Extremity Assessment: Overall WFL for tasks assessed    Cervical / Trunk Assessment Cervical / Trunk Assessment: Normal  Communication   Communication Communication: Difficulty communicating thoughts/reduced clarity of speech;Other (comment) (Patient has expressive aphasia) Cueing Techniques: Verbal cues;Tactile cues  Cognition Arousal: Alert Behavior During Therapy: WFL for tasks assessed/performed Overall Cognitive Status: Within Functional Limits for tasks assessed                                          General Comments      Exercises     Assessment/Plan    PT Assessment Patient needs continued PT services  PT Problem List Decreased activity tolerance;Decreased balance;Decreased mobility;Decreased strength       PT Treatment Interventions DME instruction;Gait training;Stair training;Functional mobility training;Therapeutic activities;Therapeutic exercise;Balance training;Patient/family education    PT Goals (Current goals can be found in the Care Plan section)  Acute Rehab PT Goals Patient Stated Goal:  return home with family to assist PT Goal Formulation: With patient Time For Goal Achievement: 04/09/23 Potential to Achieve Goals: Good    Frequency Min 3X/week     Co-evaluation               AM-PAC PT "6 Clicks" Mobility  Outcome Measure Help needed turning from your back to your side while in a flat bed without using bedrails?: None Help needed moving from lying on your back to sitting on the side of a flat bed without using bedrails?: A Little Help needed moving to and from a bed to a chair (including a wheelchair)?: A Little Help needed standing up from a chair using your arms (e.g., wheelchair or bedside chair)?: None Help needed to walk in hospital room?: A Little Help needed climbing 3-5 steps with a railing? : A Little 6 Click Score: 20    End of Session   Activity Tolerance: Patient tolerated treatment well;Patient limited by fatigue Patient left: in chair;with call bell/phone within reach Nurse Communication: Mobility status PT Visit Diagnosis: Unsteadiness on feet (R26.81);Other abnormalities of gait and mobility (R26.89);Repeated falls (R29.6)    Time: 4098-1191 PT Time Calculation (min) (ACUTE ONLY): 20 min   Charges:   PT Evaluation $PT Eval Moderate Complexity: 1 Mod PT Treatments $Therapeutic Activity: 8-22 mins PT General Charges $$ ACUTE PT VISIT: 1 Visit  1:55 PM, 04/06/23 Ocie Bob, MPT Physical Therapist with [facility] [phone] office [phone] mobile phone

## 2023-04-06 NOTE — Progress Notes (Signed)
   04/06/23 1554  TOC Brief Assessment  Insurance and Status Reviewed  Patient has primary care physician Yes  Home environment has been reviewed from home, family support, aid 3 X week  Prior level of function: Assistance, famile, aid 3X week  Prior/Current Home Services Current home services (Aide at home 3X week.  Family has cameras in home.)  Social Determinants of Health Reivew SDOH reviewed no interventions necessary  Readmission risk has been reviewed Yes  Transition of care needs transition of care needs identified, TOC will continue to follow (PT recommends HHPT- TOC will discuss with pt.)

## 2023-04-06 NOTE — Progress Notes (Addendum)
PROGRESS NOTE   Karen Luna  ZOX:[id number] DOB: October 06, 1931 DOA: 04/05/2023 PCP: [facility]   Chief Complaint  Patient presents with   Aphasia   Level of care: Telemetry  Brief Admission History:  89 y/o female with dementia, HTN, HIV on antiretrovirals, stage 3b CKD, chronic large abdominal wall hernia, anemia of chronic disease, LV hypertrophy, history of cervical and uterine cancer, s/p hysterectomy, chronic venous insufficiency, aortic atherosclerosis who was recently hospitalized at [facility] 11/25-11/26 for an episode of painless rectal bleeding and hyperkalemia with AKI.  She recovered from that and the bleeding had stopped and she was discharged back home.  She was brought in today by son from home with concern for CVA.  She was last noted to be normal yesterday at 6pm.  This morning she is aphasic with a left sided facial droop and weakness in her right lower extremity.  Code stroke protocol in the ED and was seen by teleneurologist and MRI confimed acute infarct involving the left insula and left frontal operculum.  Pt has new findings of dysphagia.  Pt is being admitted for full stroke work up per neurology recommendations.      Assessment and Plan:  Acute ischemic CVA - appreciate neurology consultation and recommendations - pt started on aspirin and plavix for 3 months followed by plavix monotherapy - TTE pending - US carotid studies  - if TTE and carotid US negative plan for 30 day cardiac event monitor - allow permissive hypertension  - check A1c and LDL - add statin for LDL<70 neuro recommending atorvastatin 40 mg daily  - neuro checks and CVA protocol  - obtain STAT CT head if change in neuro exam  - SLP evaluation requested  - ambulatory referral to neurology placed for discharge  -TTE: LVEF 65-70%, indeterminate DD, carotids: 50-69% stenosis of right carotid, left no hemodynamically significant stenosis -sent communication to [facility] office to arrange a 30 day cardiac monitor (office closed on weekends).    Right carotid artery stenosis - moderately severe - 50-69% stenosis reported - given advanced age, unsure if she will be a candidate for surgery - will refer to vascular surgery for further evaluation   Oral Thrush - fluconazole ordered - pt unable to tolerate nystatin swish due to dysphagia  Dysphagia post CVA - pt still having a difficult time per RN - aspiration precautions - SLP reconsulted to consider MBS  Large abdominal wall hernia - this is chronic - she was referred to outpatient surgery  - hernia is nontender and easily reducible   Essential hypertension  - allow permissive hypertension for 24-48 hours per neuro followed by gradual normotension - plan to start lowering BP tomorrow by slowly reintroducing home BP medication   2nd degree heart block - pt was bradycardic in ED and was given atropine in ED - HR has improved but remains in the 40-50 range but is asymptomatic - admitted to stepdown ICU but can move to telemetry today  - continuous telemetry monitoring    Stage 3b CKD  - stable from recent testing - follow  - renally dose medications as able    Dementia - resumed home medications   HIV - resumed home antiretroviral   DVT prophylaxis: [place] heparin Code Status: DNR Family Communication:  Disposition: TBD   Consultants:  neurology Procedures:   Antimicrobials:    Subjective: Pt is starting to speak better but difficult to understand language  Objective: Vitals:  04/06/23 0800 04/06/23 1030 04/06/23 1100 04/06/23 1132  BP: 136/85 (!) 166/49 (!) 136/92 (!) 170/49  Pulse: (!) 54 (!) 51 (!) 44 (!) 45  Resp: 18 (!) 23 18 14   Temp:    98 F (36.7 C)  TempSrc:    Oral  SpO2: 100% 100% 100% 100%  Weight:      Height:        Intake/Output Summary (Last 24 hours) at 04/06/2023 1219 Last data filed at 04/06/2023 0514 Gross per 24 hour  Intake  378.65 ml  Output 700 ml  Net -321.35 ml   Filed Weights   04/05/23 0820 04/05/23 1400  Weight: 52.8 kg 53.7 kg   Examination:  General exam: Appears calm and comfortable  Respiratory system: Clear to auscultation. Respiratory effort normal. Cardiovascular system: normal S1 & S2 heard. No JVD, murmurs, rubs, gallops or clicks. No pedal edema. Gastrointestinal system: Abdomen is nondistended, soft and nontender. No organomegaly or masses felt. Normal bowel sounds heard. Central nervous system: Alert. Subtle right facial droop. Dysarthria. 4/5 strength in LUE LLE. FTN intact.   Extremities: Symmetric 5 x 5 power. Skin: No rashes, lesions or ulcers. Psychiatry: Judgement and insight appear normal. Mood & affect appropriate.   Data Reviewed: I have personally reviewed following labs and imaging studies  CBC: Recent Labs  Lab 04/05/23 0840 04/05/23 0842 04/06/23 0549  WBC 6.8  --  11.3*  NEUTROABS 5.2  --   --   HGB 8.6* 9.5* 8.4*  HCT 26.6* 28.0* 25.6*  MCV 111.3*  --  110.8*  PLT 351  --  330    Basic Metabolic Panel: Recent Labs  Lab 04/05/23 0840 04/05/23 0842  NA 136 139  K 4.6 4.7  CL 103 107  CO2 22  --   GLUCOSE 98 94  BUN 34* 31*  CREATININE 1.73* 1.90*  CALCIUM 10.3  --     CBG: Recent Labs  Lab 04/05/23 0816  GLUCAP 92    Recent Results (from the past 240 hour(s))  MRSA Next Gen by PCR, Nasal     Status: None   Collection Time: 04/05/23  1:47 PM   Specimen: Nasal Mucosa; Nasal Swab  Result Value Ref Range Status   MRSA by PCR Next Gen NOT DETECTED NOT DETECTED Final    Comment: (NOTE) The GeneXpert MRSA Assay (FDA approved for NASAL specimens only), is one component of a comprehensive MRSA colonization surveillance program. It is not intended to diagnose MRSA infection nor to guide or monitor treatment for MRSA infections. Test performance is not FDA approved in patients less than 87 years old. Performed at [facility], [address],  [place], [place] [address]      Radiology Studies: DG CHEST PORT 1 VIEW  Result Date: 04/06/2023 CLINICAL DATA:  Aspiration. EXAM: PORTABLE CHEST 1 VIEW COMPARISON:  Chest radiograph dated 09/26/2022. FINDINGS: The heart is enlarged. Vascular calcifications are seen in the aortic arch. Mild left basilar atelectasis/airspace disease. The right lung is clear. No pleural effusion or pneumothorax. Degenerative changes are seen in the spine. IMPRESSION: Mild left basilar atelectasis/airspace disease. Electronically Signed   By: Romona Curls M.D.   On: 04/06/2023 12:03   ECHOCARDIOGRAM COMPLETE BUBBLE STUDY  Result Date: 04/05/2023    ECHOCARDIOGRAM REPORT   Patient Name:   LIESEL DANZA Date of Exam: 04/05/2023 Medical Rec #:  [id number]        Height:       68.0 in Accession #:  [id number]       Weight:       118.4 lb Date of Birth:  03-13-1932        BSA:          1.635 m Patient Age:    89 years         BP:           168/59 mmHg Patient Gender: F                HR:           63 bpm. [facility]am [facility] Procedure: 2D Echo, Cardiac Doppler and Color Doppler Indications:    Stroke 434.91 / I63.9  History:        Patient has prior history of Echocardiogram examinations. Risk                 Factors:Hypertension and Former Smoker.  Sonographer:    Celesta Gentile RCS Referring Phys: [id number] PRIYANKA O YADAV IMPRESSIONS  1. Left ventricular ejection fraction, by estimation, is 65 to 70%. The left ventricle has normal function. The left ventricle has no regional wall motion abnormalities. There is mild concentric left ventricular hypertrophy. Left ventricular diastolic parameters are indeterminate.  2. Right ventricular systolic function is normal. The right ventricular size is normal. There is mildly elevated pulmonary artery systolic pressure. The estimated right ventricular systolic pressure is 37.1 mmHg.  3. Left atrial size was severely dilated.  4. Right atrial size was moderately dilated.  5. The mitral  valve is degenerative. Mild mitral valve regurgitation.  6. Tricuspid valve regurgitation is mild to moderate.  7. The aortic valve is tricuspid. There is mild calcification of the aortic valve. Aortic valve regurgitation is not visualized. Aortic valve sclerosis/calcification is present, without any evidence of aortic stenosis. Aortic valve mean gradient measures 8.0 mmHg. Dimentionless index 0.72.  8. The inferior vena cava is normal in size with greater than 50% respiratory variability, suggesting right atrial pressure of 3 mmHg.  9. Agitated saline contrast bubble study was negative, with no evidence of any interatrial shunt. Comparison(s): No prior Echocardiogram. FINDINGS  Left Ventricle: Left ventricular ejection fraction, by estimation, is 65 to 70%. The left ventricle has normal function. The left ventricle has no regional wall motion abnormalities. The left ventricular internal cavity size was normal in size. There is  mild concentric left ventricular hypertrophy. Left ventricular diastolic function could not be evaluated due to atrial fibrillation. Left ventricular diastolic parameters are indeterminate. Right Ventricle: The right ventricular size is normal. No increase in right ventricular wall thickness. Right ventricular systolic function is normal. There is mildly elevated pulmonary artery systolic pressure. The tricuspid regurgitant velocity is 2.92  m/s, and with an assumed right atrial pressure of 3 mmHg, the estimated right ventricular systolic pressure is 37.1 mmHg. Left Atrium: Left atrial size was severely dilated. Right Atrium: Right atrial size was moderately dilated. Pericardium: Trivial pericardial effusion is present. The pericardial effusion is posterior to the left ventricle. Mitral Valve: The mitral valve is degenerative in appearance. Mild mitral valve regurgitation. Tricuspid Valve: The tricuspid valve is grossly normal. Tricuspid valve regurgitation is mild to moderate. Aortic Valve:  The aortic valve is tricuspid. There is mild calcification of the aortic valve. There is mild aortic valve annular calcification. Aortic valve regurgitation is not visualized. Aortic valve sclerosis/calcification is present, without any evidence of aortic stenosis. Aortic valve mean gradient measures 8.0 mmHg. Aortic valve peak gradient measures 17.6 mmHg. Aortic  valve area, by VTI measures 1.83 cm. Pulmonic Valve: The pulmonic valve was grossly normal. Pulmonic valve regurgitation is trivial. Aorta: The aortic root is normal in size and structure. Venous: The inferior vena cava is normal in size with greater than 50% respiratory variability, suggesting right atrial pressure of 3 mmHg. IAS/Shunts: No atrial level shunt detected by color flow Doppler. Agitated saline contrast was given intravenously to evaluate for intracardiac shunting. Agitated saline contrast bubble study was negative, with no evidence of any interatrial shunt.  LEFT VENTRICLE PLAX 2D LVIDd:         5.00 cm LVIDs:         3.00 cm LV PW:         1.40 cm LV IVS:        1.10 cm LVOT diam:     1.80 cm LV SV:         81 LV SV Index:   50 LVOT Area:     2.54 cm  RIGHT VENTRICLE TAPSE (M-mode): 2.8 cm LEFT ATRIUM              Index        RIGHT ATRIUM           Index LA diam:        4.60 cm  2.81 cm/m   RA Area:     23.20 cm LA Vol (A2C):   149.0 ml 91.11 ml/m  RA Volume:   74.00 ml  45.25 ml/m LA Vol (A4C):   153.0 ml 93.55 ml/m LA Biplane Vol: 152.0 ml 92.94 ml/m  AORTIC VALVE AV Area (Vmax):    1.81 cm AV Area (Vmean):   1.84 cm AV Area (VTI):     1.83 cm AV Vmax:           210.00 cm/s AV Vmean:          121.000 cm/s AV VTI:            0.444 m AV Peak Grad:      17.6 mmHg AV Mean Grad:      8.0 mmHg LVOT Vmax:         149.00 cm/s LVOT Vmean:        87.300 cm/s LVOT VTI:          0.319 m LVOT/AV VTI ratio: 0.72  AORTA Ao Root diam: 2.70 cm MITRAL VALVE                TRICUSPID VALVE MV Area (PHT): 4.26 cm     TR Peak grad:   34.1 mmHg MV  Decel Time: 178 msec     TR Vmax:        292.00 cm/s MV E velocity: 145.50 cm/s MV A velocity: 62.40 cm/s   SHUNTS MV E/A ratio:  2.33         Systemic VTI:  0.32 m                             Systemic Diam: 1.80 cm Nona Dell MD Electronically signed by Nona Dell MD Signature Date/Time: 04/05/2023/4:19:33 PM    Final    US Carotid Bilateral  Result Date: 04/05/2023 CLINICAL DATA:  89 year old female with a history of stroke and left-sided facial droop EXAM: BILATERAL CAROTID DUPLEX ULTRASOUND TECHNIQUE: Wallace Cullens scale imaging, color Doppler and duplex ultrasound were performed of bilateral carotid and vertebral arteries in the neck. COMPARISON:  None Available. FINDINGS: Criteria: Quantification  of carotid stenosis is based on velocity parameters that correlate the residual internal carotid diameter with NASCET-based stenosis levels, using the diameter of the distal internal carotid lumen as the denominator for stenosis measurement. The following velocity measurements were obtained: RIGHT ICA:  Systolic 174 cm/sec, Diastolic 43 cm/sec CCA:  108 cm/sec SYSTOLIC ICA/CCA RATIO:  1.6 ECA:  92 cm/sec LEFT ICA:  Systolic 101 cm/sec, Diastolic 22 cm/sec CCA:  92 cm/sec SYSTOLIC ICA/CCA RATIO:  1.1 ECA:  103 cm/sec Right Brachial SBP: Not acquired Left Brachial SBP: Not acquired RIGHT CAROTID ARTERY: No significant calcifications of the right common carotid artery. Intermediate waveform maintained. Moderate heterogeneous and partially calcified plaque at the right carotid bifurcation. Shadowing present. Low resistance waveform of the right ICA. Tortuosity RIGHT VERTEBRAL ARTERY: Antegrade flow with low resistance waveform. LEFT CAROTID ARTERY: No significant calcifications of the left common carotid artery. Intermediate waveform maintained. Moderate heterogeneous and partially calcified plaque at the left carotid bifurcation. Shadowing present. Low resistance waveform of the left ICA. Tortuosity LEFT VERTEBRAL  ARTERY:  Antegrade flow with low resistance waveform. IMPRESSION: Right: Heterogeneous and partially calcified plaque at the right carotid bifurcation contributes to 50%-69% stenosis by established duplex criteria. Left: Color duplex indicates moderate heterogeneous and calcified plaque, with no hemodynamically significant stenosis by duplex criteria in the extracranial cerebrovascular circulation. Signed, Yvone Neu. Miachel Roux, RPVI [facility] Electronically Signed   By: Gilmer Mor D.O.   On: 04/05/2023 13:35   MR BRAIN WO CONTRAST  Result Date: 04/05/2023 CLINICAL DATA:  Aphasia. EXAM: MRI HEAD WITHOUT CONTRAST MRA HEAD WITHOUT CONTRAST TECHNIQUE: Multiplanar, multi-echo pulse sequences of the brain and surrounding structures were acquired without intravenous contrast. Angiographic images of the Circle of Willis were acquired using MRA technique without intravenous contrast. COMPARISON:  Head CT April 05, 2023. FINDINGS: MRI HEAD FINDINGS Brain: Area of restricted diffusion involving the left insula and frontal operculum with corresponding faint T2 hyperintensity, consistent with acute infarct. No hemorrhage, hydrocephalus, mass or extra-axial collection. Scattered and confluent foci of T2 hyperintensity are seen within the white matter of the cerebral hemispheres nonspecific, likely related chronic small vessel ischemia. Moderate parenchymal volume loss. Vascular: The major flow voids at the skull base are maintained. Skull and upper cervical spine: Degenerative changes of the cervical spine with mild anterolisthesis of C2 over C3. No focal marrow lesion. Sinuses/Orbits: Chronic opacification of the right maxillary sinus. The orbits are maintained. Other: None. MRA HEAD FINDINGS Anterior circulation: Patent bilateral intracranial internal carotid arteries with severe stenosis at the paraclinoid segment of the right ICA and mild-to-moderate  stenosis at the proximal petrous segment of the left ICA. Ectatic appearance of cavernous right ICA with a 3 mm laterally projecting outpouching suggestive of an small (extradural) aneurysm. The bilateral M1 segments are patent. Occlusion of a distal left M2/MCA branch to the area of acute stroke. Posterior circulation: The bilateral intracranial vertebral arteries and basilar artery are patent without evidence of high-grade stenosis. The right posterior cerebral artery has normal flow related enhancement. There is a high-grade stenosis at the distal left P2/PCA. Anatomic variants: None significant. IMPRESSION: 1. Acute infarct involving the left insula and left frontal operculum. 2. Occlusion of a distal left M2/MCA branch to the area of acute stroke. 3. Severe stenosis at the paraclinoid segment of the right ICA and mild-to-moderate stenosis at the proximal petrous segment of the left ICA. 4. High-grade stenosis at the distal left P2/PCA. 5. Ectatic appearance of cavernous  right ICA with a 3 mm laterally projecting outpouching suggestive of an small (extradural) aneurysm. These results were called by telephone at the time of interpretation on 04/05/2023 at 11:18 am to provider [facility] , who verbally acknowledged these results. Electronically Signed   By: Baldemar Lenis M.D.   On: 04/05/2023 11:20   MR ANGIO HEAD WO CONTRAST  Result Date: 04/05/2023 CLINICAL DATA:  Aphasia. EXAM: MRI HEAD WITHOUT CONTRAST MRA HEAD WITHOUT CONTRAST TECHNIQUE: Multiplanar, multi-echo pulse sequences of the brain and surrounding structures were acquired without intravenous contrast. Angiographic images of the Circle of Willis were acquired using MRA technique without intravenous contrast. COMPARISON:  Head CT April 05, 2023. FINDINGS: MRI HEAD FINDINGS Brain: Area of restricted diffusion involving the left insula and frontal operculum with corresponding faint T2 hyperintensity, consistent with acute  infarct. No hemorrhage, hydrocephalus, mass or extra-axial collection. Scattered and confluent foci of T2 hyperintensity are seen within the white matter of the cerebral hemispheres nonspecific, likely related chronic small vessel ischemia. Moderate parenchymal volume loss. Vascular: The major flow voids at the skull base are maintained. Skull and upper cervical spine: Degenerative changes of the cervical spine with mild anterolisthesis of C2 over C3. No focal marrow lesion. Sinuses/Orbits: Chronic opacification of the right maxillary sinus. The orbits are maintained. Other: None. MRA HEAD FINDINGS Anterior circulation: Patent bilateral intracranial internal carotid arteries with severe stenosis at the paraclinoid segment of the right ICA and mild-to-moderate stenosis at the proximal petrous segment of the left ICA. Ectatic appearance of cavernous right ICA with a 3 mm laterally projecting outpouching suggestive of an small (extradural) aneurysm. The bilateral M1 segments are patent. Occlusion of a distal left M2/MCA branch to the area of acute stroke. Posterior circulation: The bilateral intracranial vertebral arteries and basilar artery are patent without evidence of high-grade stenosis. The right posterior cerebral artery has normal flow related enhancement. There is a high-grade stenosis at the distal left P2/PCA. Anatomic variants: None significant. IMPRESSION: 1. Acute infarct involving the left insula and left frontal operculum. 2. Occlusion of a distal left M2/MCA branch to the area of acute stroke. 3. Severe stenosis at the paraclinoid segment of the right ICA and mild-to-moderate stenosis at the proximal petrous segment of the left ICA. 4. High-grade stenosis at the distal left P2/PCA. 5. Ectatic appearance of cavernous right ICA with a 3 mm laterally projecting outpouching suggestive of an small (extradural) aneurysm. These results were called by telephone at the time of interpretation on 04/05/2023 at  11:18 am to provider [facility] , who verbally acknowledged these results. Electronically Signed   By: Baldemar Lenis M.D.   On: 04/05/2023 11:20   CT HEAD WO CONTRAST ( )  Result Date: 04/05/2023 CLINICAL DATA:  Aphasia and facial droop. EXAM: CT HEAD WITHOUT CONTRAST TECHNIQUE: Contiguous axial images were obtained from the base of the skull through the vertex without intravenous contrast. RADIATION DOSE REDUCTION: This exam was performed according to the departmental dose-optimization program which includes automated exposure control, adjustment of the mA and/or kV according to patient size and/or use of iterative reconstruction technique. COMPARISON:  Head CT 12/10/2022 and MRI 10/04/2022 FINDINGS: Brain: There is no evidence of an acute infarct, intracranial hemorrhage, mass, midline shift, or extra-axial fluid collection. Mild cerebral atrophy is within limits for age. Patchy to confluent hypodensities in the cerebral white matter are similar to the prior CT and are nonspecific but compatible with moderate chronic small vessel ischemic disease. Vascular: Calcified atherosclerosis  at the skull base. No hyperdense vessel. Skull: No acute fracture or suspicious osseous lesion. Sinuses/Orbits: Chronic right maxillary sinusitis with partially visualized material chronically extending into the nasal cavity. Clear mastoid air cells. Unremarkable included orbits. Other: None. IMPRESSION: 1. No evidence of acute intracranial abnormality. 2. Moderate chronic small vessel ischemic disease. Electronically Signed   By: Sebastian Ache M.D.   On: 04/05/2023 10:07    Scheduled Meds:  aspirin  81 mg Oral Daily   bictegravir-emtricitabine-tenofovir AF  1 tablet Oral Daily   Chlorhexidine Gluconate Cloth  6 each Topical Daily   citalopram  20 mg Oral Daily   clopidogrel  75 mg Oral Daily   memantine  21 mg Oral QHS   And   donepezil  10 mg Oral QHS   heparin  5,000 Units Subcutaneous Q8H    Continuous Infusions:  sodium chloride 50 mL/hr at 04/05/23 1538     LOS: 0 days   Time spent: 52 mins  Heather Streeper Laural Benes, MD How to contact the [facility] Attending or Consulting provider 7A - 7P or covering provider during after hours 7P -7A, for this patient?  Check the care team in [facility] and look for a) attending/consulting [facility] provider listed and b) the [facility] team listed Log into [email or web address] to find provider on call.  Locate the [facility] provider you are looking for under [facility] and page to a number that you can be directly reached. If you still have difficulty reaching the provider, please page the [facility] (Director on Call) for the Hospitalists listed on amion for assistance.  04/06/2023, 12:19 PM

## 2023-04-06 NOTE — Progress Notes (Signed)
Pt transferred to room 04 via wheelchair from the ICU.  No complaints of pain or discomfort.  Pt has to be reoriented to place time and situation. Bed alarms on because pt is getting oob without supervision.  Close monitoring of the pt will be implemented.  Explained to the pt that she needs to call or asst when getting oob, pt appears to understand but leaving the room she proceed to get oob.  The most sensitive alarms are set to deter pt for leaving the bed.  Door to the room left open and frequent visits via staff will be done.

## 2023-04-06 NOTE — Progress Notes (Signed)
Pt remains confused to place/time/situation. Has repeatedly attempted to get OOB and out of chair without assistance since arrival to this unit. Unable to reorient pt, unable to make her understand risk of fall and injury from getting up without calling for assistance. Pt does not respond to verbal redirection, requires staff members to physically redirect for safety. Pt will not/ cannot use call bell, bed alarm and chair alarm ringing almost constantly. Pt requiring staff member at bedside to prevent her from falling. MD Laural Benes notified and request made for safety sitter.

## 2023-04-07 DIAGNOSIS — B20 Human immunodeficiency virus [HIV] disease: Secondary | ICD-10-CM | POA: Diagnosis not present

## 2023-04-07 DIAGNOSIS — N1832 Chronic kidney disease, stage 3b: Secondary | ICD-10-CM | POA: Diagnosis not present

## 2023-04-07 DIAGNOSIS — Z8541 Personal history of malignant neoplasm of cervix uteri: Secondary | ICD-10-CM | POA: Diagnosis not present

## 2023-04-07 DIAGNOSIS — I639 Cerebral infarction, unspecified: Secondary | ICD-10-CM | POA: Diagnosis not present

## 2023-04-07 MED ORDER — ATROPINE SULFATE 1 MG/ML IV SOLN
0.5000 mg | Freq: Once | INTRAVENOUS | Status: AC
  Administered 2023-04-07: 0.5 mg via INTRAVENOUS
  Filled 2023-04-07: qty 1

## 2023-04-07 MED ORDER — HYDRALAZINE HCL 25 MG PO TABS
25.0000 mg | ORAL_TABLET | Freq: Three times a day (TID) | ORAL | Status: DC
  Administered 2023-04-07 – 2023-04-08 (×4): 25 mg via ORAL
  Filled 2023-04-07 (×4): qty 1

## 2023-04-07 MED ORDER — HALOPERIDOL LACTATE 5 MG/ML IJ SOLN
1.0000 mg | Freq: Four times a day (QID) | INTRAMUSCULAR | Status: DC | PRN
  Administered 2023-04-07: 1 mg via INTRAMUSCULAR
  Filled 2023-04-07 (×2): qty 1

## 2023-04-07 MED ORDER — AMLODIPINE BESYLATE 5 MG PO TABS
10.0000 mg | ORAL_TABLET | Freq: Every day | ORAL | Status: DC
  Administered 2023-04-07 – 2023-04-08 (×2): 10 mg via ORAL
  Filled 2023-04-07 (×2): qty 2

## 2023-04-07 MED ORDER — ATROPINE SULFATE 1 MG/ML IV SOLN: 0.5000 mg | Freq: Once | INTRAVENOUS | Status: DC | PRN

## 2023-04-07 MED ORDER — LORAZEPAM 0.5 MG PO TABS
0.5000 mg | ORAL_TABLET | ORAL | Status: DC | PRN
  Administered 2023-04-07 (×2): 0.5 mg via ORAL
  Filled 2023-04-07 (×2): qty 1

## 2023-04-07 NOTE — Plan of Care (Signed)
  Problem: Clinical Measurements: Goal: Will remain free from infection Outcome: Progressing   Problem: Activity: Goal: Risk for activity intolerance will decrease Outcome: Progressing   Problem: Nutrition: Goal: Adequate nutrition will be maintained Outcome: Progressing   Problem: Coping: Goal: Level of anxiety will decrease Outcome: Progressing   

## 2023-04-07 NOTE — TOC Progression Note (Signed)
Transition of Care [facility]) - Progression Note    Patient Details  Name: Karen Luna MRN: [id number] Date of Birth: April 21, 1932  Transition of Care [facility]) CM/SW Contact  Catalina Gravel, LCSW Phone Number: 04/07/2023, 1:17 PM  Clinical Narrative:    CSW visited pt in room, sitter was present.  Pt sat up in bed and was emotional, crying,stated -I don't know where I am.  CSW tried to reassure her, encouraged her to lay back down and shared she is safe and in hospital, she was crying, not very coherent so I did not discuss HHPT recommendation during visit.  CSW attempted to call son, left VM and sent a text message. On 12/7 CSW did speak with son  about HHPT and DC planning. He stated on that call that is mom should not be ready yet to leave hospital yet, implying it was too soon. Son shared that pt has an aide 3x a week but lives alone and family has cameras installed.   TOC to follow.  Expected Discharge Plan: Home w Home Health Services Barriers to Discharge: Continued Medical Work up  Expected Discharge Plan and Services                                               Social Determinants of Health (SDOH) Interventions SDOH Screenings   Food Insecurity: No Food Insecurity (04/05/2023)  Housing: Patient Unable To Answer (04/05/2023)  Transportation Needs: No Transportation Needs (04/05/2023)  Utilities: Not At Risk (04/05/2023)  Depression (PHQ2-9): Low Risk  (06/26/2022)  Financial Resource Strain: Low Risk  (09/27/2022)   [facility]  Social Connections: Unknown (09/26/2022)   Received from [facility]  Tobacco Use: Medium Risk (04/05/2023)    Readmission Risk Interventions     No data to display

## 2023-04-07 NOTE — Progress Notes (Signed)
PROGRESS NOTE   Karen Luna  UEA:[id number] DOB: [birth year] DOA: 04/05/2023 PCP: [facility]   Chief Complaint  Patient presents with   Aphasia   Level of care: Telemetry  Brief Admission History:  89 y/o female with dementia, HTN, HIV on antiretrovirals, stage 3b CKD, chronic large abdominal wall hernia, anemia of chronic disease, LV hypertrophy, history of cervical and uterine cancer, s/p hysterectomy, chronic venous insufficiency, aortic atherosclerosis who was recently hospitalized at [facility] 11/25-11/26 for an episode of painless rectal bleeding and hyperkalemia with AKI.  She recovered from that and the bleeding had stopped and she was discharged back home.  She was brought in today by son from home with concern for CVA.  She was last noted to be normal yesterday at 6pm.  This morning she is aphasic with a left sided facial droop and weakness in her right lower extremity.  Code stroke protocol in the ED and was seen by teleneurologist and MRI confimed acute infarct involving the left insula and left frontal operculum.  Pt has new findings of dysphagia.  Pt is being admitted for full stroke work up per neurology recommendations.      Assessment and Plan:  Acute ischemic CVA - appreciate neurology consultation and recommendations - pt started on aspirin and plavix for 3 months followed by plavix monotherapy - US carotid studies: 50-69% stenosis of right carotid, left no hemodynamically significant stenosis - if TTE and carotid US negative plan for 30 day cardiac event monitor - allow permissive hypertension  - check A1c and LDL - add statin for LDL<70 neuro recommending atorvastatin 40 mg daily  - neuro checks and CVA protocol  - obtain STAT CT head if change in neuro exam  - SLP evaluation requested  - ambulatory referral to neurology placed for discharge  -TTE: LVEF 65-70%, indeterminate DD, carotids: 50-69% stenosis of right carotid, left no  hemodynamically significant stenosis -sent communication to [facility] [place] office to request them to arrange a 30 day cardiac monitor (office closed on weekends).    Right carotid artery stenosis - moderately severe - 50-69% stenosis reported - given advanced age, unsure if she will be a candidate for surgery - will refer to vascular surgery for further evaluation   Oral Thrush - fluconazole ordered - pt unable to tolerate nystatin swish due to dysphagia  Dysphagia post CVA - pt still having a difficult time per RN - aspiration precautions - SLP reconsulted to consider MBS  Large abdominal wall hernia - this is chronic - she was referred to outpatient surgery  - hernia is nontender and easily reducible   Essential hypertension  - allow permissive hypertension for 24-48 hours per neuro followed by gradual normotension - plan to start lowering BP slowly reintroducing home BP medications - resume home amlodipine 10 mg, reduced home hydralazine to 25 mg TID, titrate up over next couple of days to home dose of 100 mg.     2nd degree heart block - pt was bradycardic in ED and was given atropine in ED - HR has been labile and some HRs in 30s and was given additional doses of atropine - initially monitored in stepdown ICU but moved to telemetry  - continuous telemetry monitoring  - will ask cardiology to see given recurrent administration of atropine   Stage 3b CKD  - stable from recent testing - follow  - renally dose medications as able    Dementia - resumed home medications  Acute delirium - secondary to acute CVA and hospitalization - delirium precautions ordered - safety sitter required at this time   HIV - resumed home antiretroviral   DVT prophylaxis: Meigs heparin Code Status: DNR Family Communication: t/c no answer 12/8 Disposition: TBD   Consultants:  neurology  Procedures:   Antimicrobials:    Subjective: Pt having acute delirium with crying  and confusion at times.    Objective: Vitals:   04/07/23 0353 04/07/23 1019 04/07/23 1200 04/07/23 1355  BP: (!) 162/75 (!) 148/60 (!) 155/54 (!) 173/67  Pulse: (!) 48 (!) 43 (!) 44 (!) 52  Resp: 16 20  (!) 21  Temp:  97.8 F (36.6 C)  97.9 F (36.6 C)  TempSrc:  Axillary  Oral  SpO2: 100% 100%  100%  Weight:      Height:        Intake/Output Summary (Last 24 hours) at 04/07/2023 1456 Last data filed at 04/07/2023 1300 Gross per 24 hour  Intake 120 ml  Output --  Net 120 ml   Filed Weights   04/05/23 0820 04/05/23 1400  Weight: 52.8 kg 53.7 kg   Examination:  General exam: Appears calm and comfortable. Pt having acute delirium symptoms.   Respiratory system: Clear to auscultation. Respiratory effort normal. Cardiovascular system: normal S1 & S2 heard. No JVD, murmurs, rubs, gallops or clicks. No pedal edema. Gastrointestinal system: Abdomen is nondistended, soft and nontender. No organomegaly or masses felt. Normal bowel sounds heard. Central nervous system: Alert. Subtle right facial droop. Dysarthria. 4/5 strength in LUE LLE. FTN intact.   Extremities: Symmetric 5 x 5 power. Skin: No rashes, lesions or ulcers. Psychiatry: Judgement and insight appear diminished. Mood & affect anxious, tearful at times.   Data Reviewed: I have personally reviewed following labs and imaging studies  CBC: Recent Labs  Lab 04/05/23 0840 04/05/23 0842 04/06/23 0549  WBC 6.8  --  11.3*  NEUTROABS 5.2  --   --   HGB 8.6* 9.5* 8.4*  HCT 26.6* 28.0* 25.6*  MCV 111.3*  --  110.8*  PLT 351  --  330    Basic Metabolic Panel: Recent Labs  Lab 04/05/23 0840 04/05/23 0842  NA 136 139  K 4.6 4.7  CL 103 107  CO2 22  --   GLUCOSE 98 94  BUN 34* 31*  CREATININE 1.73* 1.90*  CALCIUM 10.3  --     CBG: Recent Labs  Lab 04/05/23 0816  GLUCAP 92    Recent Results (from the past 240 hour(s))  MRSA Next Gen by PCR, Nasal     Status: None   Collection Time: 04/05/23  1:47 PM    Specimen: Nasal Mucosa; Nasal Swab  Result Value Ref Range Status   MRSA by PCR Next Gen NOT DETECTED NOT DETECTED Final    Comment: (NOTE) The GeneXpert MRSA Assay (FDA approved for NASAL specimens only), is one component of a comprehensive MRSA colonization surveillance program. It is not intended to diagnose MRSA infection nor to guide or monitor treatment for MRSA infections. Test performance is not FDA approved in patients less than 46 years old. Performed at [facility], [address] [place], [place] [address]      Radiology Studies: DG CHEST PORT 1 VIEW  Result Date: 04/06/2023 CLINICAL DATA:  Aspiration. EXAM: PORTABLE CHEST 1 VIEW COMPARISON:  Chest radiograph dated 09/26/2022. FINDINGS: The heart is enlarged. Vascular calcifications are seen in the aortic arch. Mild left basilar atelectasis/airspace disease. The right lung  is clear. No pleural effusion or pneumothorax. Degenerative changes are seen in the spine. IMPRESSION: Mild left basilar atelectasis/airspace disease. Electronically Signed   By: Romona Curls M.D.   On: 04/06/2023 12:03   ECHOCARDIOGRAM COMPLETE BUBBLE STUDY  Result Date: 04/05/2023    ECHOCARDIOGRAM REPORT   Patient Name:   WANDRA KAZEMI Date of Exam: 04/05/2023 Medical Rec #:  [id number]        Height:       68.0 in Accession #:    [id number]       Weight:       118.4 lb Date of Birth:  07/19/1931        BSA:          1.635 m Patient Age:    89 years         BP:           168/59 mmHg Patient Gender: F                HR:           63 bpm. [facility]am [facility] Procedure: 2D Echo, Cardiac Doppler and Color Doppler Indications:    Stroke 434.91 / I63.9  History:        Patient has prior history of Echocardiogram examinations. Risk                 Factors:Hypertension and Former Smoker.  Sonographer:    Celesta Gentile RCS Referring Phys: [id number] PRIYANKA O YADAV IMPRESSIONS  1. Left ventricular ejection fraction, by estimation, is 65 to 70%. The left ventricle  has normal function. The left ventricle has no regional wall motion abnormalities. There is mild concentric left ventricular hypertrophy. Left ventricular diastolic parameters are indeterminate.  2. Right ventricular systolic function is normal. The right ventricular size is normal. There is mildly elevated pulmonary artery systolic pressure. The estimated right ventricular systolic pressure is 37.1 mmHg.  3. Left atrial size was severely dilated.  4. Right atrial size was moderately dilated.  5. The mitral valve is degenerative. Mild mitral valve regurgitation.  6. Tricuspid valve regurgitation is mild to moderate.  7. The aortic valve is tricuspid. There is mild calcification of the aortic valve. Aortic valve regurgitation is not visualized. Aortic valve sclerosis/calcification is present, without any evidence of aortic stenosis. Aortic valve mean gradient measures 8.0 mmHg. Dimentionless index 0.72.  8. The inferior vena cava is normal in size with greater than 50% respiratory variability, suggesting right atrial pressure of 3 mmHg.  9. Agitated saline contrast bubble study was negative, with no evidence of any interatrial shunt. Comparison(s): No prior Echocardiogram. FINDINGS  Left Ventricle: Left ventricular ejection fraction, by estimation, is 65 to 70%. The left ventricle has normal function. The left ventricle has no regional wall motion abnormalities. The left ventricular internal cavity size was normal in size. There is  mild concentric left ventricular hypertrophy. Left ventricular diastolic function could not be evaluated due to atrial fibrillation. Left ventricular diastolic parameters are indeterminate. Right Ventricle: The right ventricular size is normal. No increase in right ventricular wall thickness. Right ventricular systolic function is normal. There is mildly elevated pulmonary artery systolic pressure. The tricuspid regurgitant velocity is 2.92  m/s, and with an assumed right atrial pressure  of 3 mmHg, the estimated right ventricular systolic pressure is 37.1 mmHg. Left Atrium: Left atrial size was severely dilated. Right Atrium: Right atrial size was moderately dilated. Pericardium: Trivial pericardial effusion is present. The pericardial effusion  is posterior to the left ventricle. Mitral Valve: The mitral valve is degenerative in appearance. Mild mitral valve regurgitation. Tricuspid Valve: The tricuspid valve is grossly normal. Tricuspid valve regurgitation is mild to moderate. Aortic Valve: The aortic valve is tricuspid. There is mild calcification of the aortic valve. There is mild aortic valve annular calcification. Aortic valve regurgitation is not visualized. Aortic valve sclerosis/calcification is present, without any evidence of aortic stenosis. Aortic valve mean gradient measures 8.0 mmHg. Aortic valve peak gradient measures 17.6 mmHg. Aortic valve area, by VTI measures 1.83 cm. Pulmonic Valve: The pulmonic valve was grossly normal. Pulmonic valve regurgitation is trivial. Aorta: The aortic root is normal in size and structure. Venous: The inferior vena cava is normal in size with greater than 50% respiratory variability, suggesting right atrial pressure of 3 mmHg. IAS/Shunts: No atrial level shunt detected by color flow Doppler. Agitated saline contrast was given intravenously to evaluate for intracardiac shunting. Agitated saline contrast bubble study was negative, with no evidence of any interatrial shunt.  LEFT VENTRICLE PLAX 2D LVIDd:         5.00 cm LVIDs:         3.00 cm LV PW:         1.40 cm LV IVS:        1.10 cm LVOT diam:     1.80 cm LV SV:         81 LV SV Index:   50 LVOT Area:     2.54 cm  RIGHT VENTRICLE TAPSE (M-mode): 2.8 cm LEFT ATRIUM              Index        RIGHT ATRIUM           Index LA diam:        4.60 cm  2.81 cm/m   RA Area:     23.20 cm LA Vol (A2C):   149.0 ml 91.11 ml/m  RA Volume:   74.00 ml  45.25 ml/m LA Vol (A4C):   153.0 ml 93.55 ml/m LA Biplane  Vol: 152.0 ml 92.94 ml/m  AORTIC VALVE AV Area (Vmax):    1.81 cm AV Area (Vmean):   1.84 cm AV Area (VTI):     1.83 cm AV Vmax:           210.00 cm/s AV Vmean:          121.000 cm/s AV VTI:            0.444 m AV Peak Grad:      17.6 mmHg AV Mean Grad:      8.0 mmHg LVOT Vmax:         149.00 cm/s LVOT Vmean:        87.300 cm/s LVOT VTI:          0.319 m LVOT/AV VTI ratio: 0.72  AORTA Ao Root diam: 2.70 cm MITRAL VALVE                TRICUSPID VALVE MV Area (PHT): 4.26 cm     TR Peak grad:   34.1 mmHg MV Decel Time: 178 msec     TR Vmax:        292.00 cm/s MV E velocity: 145.50 cm/s MV A velocity: 62.40 cm/s   SHUNTS MV E/A ratio:  2.33         Systemic VTI:  0.32 m  Systemic Diam: 1.80 cm Nona Dell MD Electronically signed by Nona Dell MD Signature Date/Time: 04/05/2023/4:19:33 PM    Final     Scheduled Meds:  aspirin  81 mg Oral Daily   bictegravir-emtricitabine-tenofovir AF  1 tablet Oral Daily   Chlorhexidine Gluconate Cloth  6 each Topical Daily   citalopram  20 mg Oral Daily   clopidogrel  75 mg Oral Daily   memantine  21 mg Oral QHS   And   donepezil  10 mg Oral QHS   heparin  5,000 Units Subcutaneous Q8H   Continuous Infusions:   LOS: 1 day   Time spent: 55 mins  Evelisse Szalkowski Laural Benes, MD How to contact the [facility] Attending or Consulting provider 7A - 7P or covering provider during after hours 7P -7A, for this patient?  Check the care team in [facility] and look for a) attending/consulting TRH provider listed and b) the [facility] team listed Log into [email or web address] to find provider on call.  Locate the [facility] provider you are looking for under [facility] and page to a number that you can be directly reached. If you still have difficulty reaching the provider, please page the [facility] (Director on Call) for the Hospitalists listed on amion for assistance.  04/07/2023, 2:56 PM

## 2023-04-07 NOTE — TOC Initial Note (Signed)
Transition of Care [facility]) - Initial/Assessment Note    Patient Details  Name: Karen Luna MRN: [id number] Date of Birth: March 06, 1932  Transition [facility] Care [facility]) CM/SW Contact:    Catalina Gravel, LCSW Phone Number: 04/07/2023, 1:24 PM  Clinical Narrative:                 Pt is high risk for readmission. CSW completed assessment with pts son. Pt lives alone, family involved and has cameras in the home.  Pt has an aide 3 times a week who sists with and assists with ADLs. Pts son is able to provide transportation to appointments when needed. DME in home not shared, son was unclear on if patient was DC ready so soon. Assessment not completed,nor did son agree to HHPT during the call, but CSW asked that he discuss with pt. TOC to follow.    Expected Discharge Plan: Home w Home Health Services Barriers to Discharge: Continued Medical Work up   Patient Goals and CMS Choice            Expected Discharge Plan and Services                                              Prior Living Arrangements/Services                       Activities of Daily Living   ADL Screening (condition at time of admission) Independently performs ADLs?: No Does the patient have a NEW difficulty with bathing/dressing/toileting/self-feeding that is expected to last >3 days?: No Does the patient have a NEW difficulty with getting in/out of bed, walking, or climbing stairs that is expected to last >3 days?: No Does the patient have a NEW difficulty with communication that is expected to last >3 days?: No Is the patient deaf or have difficulty hearing?: Yes Does the patient have difficulty seeing, even when wearing glasses/contacts?: No Does the patient have difficulty concentrating, remembering, or making decisions?: Yes  Permission Sought/Granted                  Emotional Assessment              Admission diagnosis:  Cerebrovascular accident (CVA), unspecified mechanism  (HCC) [I63.9] Acute stroke due to ischemia [facility]) [I63.9] Patient Active Problem List   Diagnosis Date Noted   Acute stroke due to ischemia (HCC) 04/05/2023   Aphasia 04/05/2023   Wenckebach second degree AV block 04/05/2023   Dysphagia 04/05/2023   Oral thrush 04/05/2023   Acute kidney injury (AKI) with acute tubular necrosis (ATN) (HCC) 03/26/2023   Acute kidney injury superimposed on CKD (HCC) 03/25/2023   Hyperkalemia 03/25/2023   Rectal bleeding 03/25/2023   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) 03/25/2023   Syncope 12/10/2022   Dizziness 07/18/2017   Vaccine counseling 01/08/2017   Dementia (HCC) 07/17/2016   Confusion 07/17/2016   Sinus congestion 06/27/2016   Severe major depression, single episode, with psychotic features (HCC) 06/21/2016   Onychomycosis of left great toe 09/09/2015   Aortic atherosclerosis (HCC) 01/06/2015   Acquired heart block 12/29/2014   Encounter for long-term (current) use of medications 09/16/2014   Screening examination for venereal disease 09/16/2014   Chronic venous insufficiency 10/03/2012   History of cervical cancer 08/11/2012   Healthcare maintenance 07/25/2012   Left ventricular hypertrophy  due to hypertensive disease 07/25/2012   Anemia of chronic disease 07/06/2011   CKD stage 3b, GFR 30-44 ml/min (HCC) - baseline SCr 1.3-1.6 09/01/2008   Low grade squamous intraepithelial lesion (LGSIL) on cervical Pap smear 08/06/2007   Essential hypertension 09/19/2006   Human immunodeficiency virus (HIV) disease (HCC) 09/12/2006   PCP:  [facility]:   [id number] - [place], [place] - [address] [place] [address] Phone: [phone] Fax: [phone]  [facility] [address], [place] - [address] [place] [address] Phone: [phone] Fax: [phone]  [facility] - [place], [place] - [address] [place] [address] Phone: [phone] Fax: [phone]     Social Determinants of Health (SDOH) Social History: SDOH Screenings   Food Insecurity: No Food Insecurity (04/05/2023)  Housing: Patient Unable To Answer (04/05/2023)  Transportation Needs: No Transportation Needs (04/05/2023)  Utilities: Not At Risk (04/05/2023)  Depression (PHQ2-9): Low Risk  (06/26/2022)  Financial Resource Strain: Low Risk  (09/27/2022)   [facility]  Social Connections: Unknown (09/26/2022)   Received from [facility]  Tobacco Use: Medium Risk (04/05/2023)   SDOH Interventions:     Readmission Risk Interventions    04/07/2023    1:23 PM  Readmission Risk Prevention Plan  Transportation Screening Complete  PCP or Specialist Appt within 3-5 Days Complete  HRI or Home Care Consult Complete  Social Work Consult for Recovery Care Planning/Counseling Complete  Palliative Care Screening Not Applicable  Medication Review Oceanographer) Complete

## 2023-04-07 NOTE — Progress Notes (Signed)
Pt oriented to person only during the night. Pt began to cry and stated "I am ready to go home." Emotional support given. Pt attempted to exit bed multiple times and was pulling at devices. Attempted to redirect the patient with little success. MD notified. PRN Ativan and PRN Haldol ordered by physician. PRN Ativan given and was ineffective. MD notified again and PRN Haldol was given per MD order.   Pt bradycardic throughout the night. Pt dropped into the 30s. BP was 162/75 at this time. MD notified. One time dose of Atropine was ordered and given by an Charity fundraiser. HR up to mid 50s after administration.

## 2023-04-07 NOTE — Plan of Care (Signed)
  Problem: Health Behavior/Discharge Planning: Goal: Ability to manage health-related needs will improve Outcome: Progressing   Problem: Clinical Measurements: Goal: Ability to maintain clinical measurements within normal limits will improve Outcome: Progressing Goal: Will remain free from infection Outcome: Progressing Goal: Diagnostic test results will improve Outcome: Progressing Goal: Respiratory complications will improve Outcome: Progressing Goal: Cardiovascular complication will be avoided Outcome: Progressing   Problem: Activity: Goal: Risk for activity intolerance will decrease Outcome: Progressing   Problem: Nutrition: Goal: Adequate nutrition will be maintained Outcome: Progressing   Problem: Coping: Goal: Level of anxiety will decrease Outcome: Progressing   Problem: Elimination: Goal: Will not experience complications related to bowel motility Outcome: Progressing Goal: Will not experience complications related to urinary retention Outcome: Progressing   Problem: Pain Management: Goal: General experience of comfort will improve Outcome: Progressing   Problem: Safety: Goal: Ability to remain free from injury will improve Outcome: Progressing   Problem: Skin Integrity: Goal: Risk for impaired skin integrity will decrease Outcome: Progressing   Problem: Education: Goal: Knowledge of disease or condition will improve Outcome: Progressing Goal: Knowledge of secondary prevention will improve (MUST DOCUMENT ALL) Outcome: Progressing Goal: Knowledge of patient specific risk factors will improve Loraine Leriche N/A or DELETE if not current risk factor) Outcome: Progressing   Problem: Ischemic Stroke/TIA Tissue Perfusion: Goal: Complications of ischemic stroke/TIA will be minimized Outcome: Progressing   Problem: Coping: Goal: Will verbalize positive feelings about self Outcome: Progressing Goal: Will identify appropriate support needs Outcome: Progressing    Problem: Health Behavior/Discharge Planning: Goal: Ability to manage health-related needs will improve Outcome: Progressing Goal: Goals will be collaboratively established with patient/family Outcome: Progressing   Problem: Self-Care: Goal: Ability to participate in self-care as condition permits will improve Outcome: Progressing Goal: Verbalization of feelings and concerns over difficulty with self-care will improve Outcome: Progressing Goal: Ability to communicate needs accurately will improve Outcome: Progressing   Problem: Nutrition: Goal: Risk of aspiration will decrease Outcome: Progressing Goal: Dietary intake will improve Outcome: Progressing

## 2023-04-08 ENCOUNTER — Telehealth: Payer: Self-pay

## 2023-04-08 ENCOUNTER — Encounter (HOSPITAL_COMMUNITY): Payer: Self-pay | Admitting: Family Medicine

## 2023-04-08 DIAGNOSIS — R001 Bradycardia, unspecified: Secondary | ICD-10-CM

## 2023-04-08 DIAGNOSIS — B20 Human immunodeficiency virus [HIV] disease: Secondary | ICD-10-CM | POA: Diagnosis not present

## 2023-04-08 DIAGNOSIS — F0390 Unspecified dementia without behavioral disturbance: Secondary | ICD-10-CM | POA: Diagnosis not present

## 2023-04-08 DIAGNOSIS — I6389 Other cerebral infarction: Secondary | ICD-10-CM | POA: Diagnosis not present

## 2023-04-08 DIAGNOSIS — F0280 Dementia in other diseases classified elsewhere without behavioral disturbance: Secondary | ICD-10-CM

## 2023-04-08 DIAGNOSIS — I441 Atrioventricular block, second degree: Secondary | ICD-10-CM | POA: Diagnosis not present

## 2023-04-08 DIAGNOSIS — Z7189 Other specified counseling: Secondary | ICD-10-CM

## 2023-04-08 DIAGNOSIS — I639 Cerebral infarction, unspecified: Secondary | ICD-10-CM | POA: Diagnosis not present

## 2023-04-08 DIAGNOSIS — G309 Alzheimer's disease, unspecified: Secondary | ICD-10-CM | POA: Diagnosis not present

## 2023-04-08 DIAGNOSIS — Z515 Encounter for palliative care: Secondary | ICD-10-CM

## 2023-04-08 MED ORDER — HYDRALAZINE HCL 25 MG PO TABS: 25.0000 mg | ORAL_TABLET | Freq: Three times a day (TID) | ORAL | 1 refills | Status: DC

## 2023-04-08 MED ORDER — CLOPIDOGREL BISULFATE 75 MG PO TABS: 75.0000 mg | ORAL_TABLET | Freq: Every day | ORAL | 1 refills | Status: DC

## 2023-04-08 MED ORDER — MEMANTINE HCL ER 21 MG PO CP24: 21.0000 mg | ORAL_CAPSULE | Freq: Every day | ORAL | 1 refills | Status: DC

## 2023-04-08 MED ORDER — ASPIRIN 81 MG PO TBEC: 81.0000 mg | DELAYED_RELEASE_TABLET | Freq: Every day | ORAL | Status: AC

## 2023-04-08 MED ORDER — CLOPIDOGREL BISULFATE 75 MG PO TABS: 75.0000 mg | ORAL_TABLET | Freq: Every day | ORAL | 0 refills | Status: DC

## 2023-04-08 MED ORDER — CLOPIDOGREL BISULFATE 75 MG PO TABS: 75.0000 mg | ORAL_TABLET | Freq: Every day | ORAL | 1 refills | Status: AC

## 2023-04-08 NOTE — Progress Notes (Signed)
Physical Therapy Treatment Patient Details Name: Karen Luna MRN: [id number] DOB: Feb 18, 1932 Today's Date: 04/08/2023   History of Present Illness Karen Luna is a 89 y/o female with dementia, HTN, HIV on antiretrovirals, stage 3b CKD, chronic large abdominal wall hernia, anemia of chronic disease, LV hypertrophy, history of cervical and uterine cancer, s/p hysterectomy, chronic venous insufficiency, aortic atherosclerosis who was recently hospitalized at [facility] 11/25-11/26 for an episode of painless rectal bleeding and hyperkalemia with AKI.  She recovered from that and the bleeding had stopped and she was discharged back home.  She was brought in today by son from home with concern for CVA.  She was last noted to be normal yesterday at 6pm.  This morning she is aphasic with a left sided facial droop and weakness in her right lower extremity.  Code stroke protocol in the ED and was seen by teleneurologist and MRI confimed acute infarct involving the left insula and left frontal operculum.  Pt has new findings of dysphagia.  Pt is being admitted for full stroke work up per neurology recommendations.    PT Comments  Patient demonstrates slightly labored movement for sitting up at bedside, good return for transferring to/from commode in bathroom and ambulating in room/hallway using RW and walking without AD without loss of balance.  Patient tolerated sitting up at bedside after therapy with nursing staff in room.  Patient will benefit from continued skilled physical therapy in hospital and recommended venue below to increase strength, balance, endurance for safe ADLs and gait.     If plan is discharge home, recommend the following: Assistance with cooking/housework;A little help with bathing/dressing/bathroom;Help with stairs or ramp for entrance   Can travel by private vehicle        Equipment Recommendations  None recommended by PT    Recommendations for Other Services       Precautions /  Restrictions Precautions Precautions: Fall Restrictions Weight Bearing Restrictions: No     Mobility  Bed Mobility Overal bed mobility: Needs Assistance Bed Mobility: Supine to Sit     Supine to sit: Supervision     General bed mobility comments: slightly labored movement for sitting up at bedside    Transfers Overall transfer level: Needs assistance Equipment used: Rolling walker (2 wheels), None Transfers: Sit to/from Stand, Bed to chair/wheelchair/BSC Sit to Stand: Supervision   Step pivot transfers: Supervision       General transfer comment: good return for transferring to/from commode in bathroom    Ambulation/Gait Ambulation/Gait assistance: Supervision Gait Distance (Feet): 120 Feet Assistive device: Rollator (4 wheels), None Gait Pattern/deviations: Decreased step length - right, Decreased step length - left, Decreased stride length Gait velocity: decreased     General Gait Details: Patient demonstrates good return for using RW during gait training, but prefers to ambulate without AD with fair/good return no loss of balance, but had to slow cadence   Stairs             Wheelchair Mobility     Tilt Bed    Modified Rankin (Stroke Patients Only)       Balance Overall balance assessment: Needs assistance Sitting-balance support: Feet supported, No upper extremity supported Sitting balance-Leahy Scale: Good Sitting balance - Comments: seated at EOB   Standing balance support: During functional activity, No upper extremity supported Standing balance-Leahy Scale: Fair Standing balance comment: fair/good without AD and using RW  Cognition Arousal: Alert Behavior During Therapy: WFL for tasks assessed/performed Overall Cognitive Status: Within Functional Limits for tasks assessed                                          Exercises      General Comments        Pertinent  Vitals/Pain Pain Assessment Pain Assessment: No/denies pain    Home Living Family/patient expects to be discharged to:: Private residence Living Arrangements: Alone Available Help at Discharge: Family;Available PRN/intermittently Type of Home: Apartment Home Access: Stairs to enter Entrance Stairs-Rails: Right;Left;Can reach both Entrance Stairs-Number of Steps: 3   Home Layout: One level Home Equipment: Grab bars - tub/shower      Prior Function            PT Goals (current goals can now be found in the care plan section) Acute Rehab PT Goals Patient Stated Goal: return home with family to assist PT Goal Formulation: With patient Time For Goal Achievement: 04/09/23 Potential to Achieve Goals: Good Progress towards PT goals: Progressing toward goals    Frequency    Min 3X/week      PT Plan      Co-evaluation              AM-PAC PT "6 Clicks" Mobility   Outcome Measure  Help needed turning from your back to your side while in a flat bed without using bedrails?: None Help needed moving from lying on your back to sitting on the side of a flat bed without using bedrails?: None Help needed moving to and from a bed to a chair (including a wheelchair)?: None Help needed standing up from a chair using your arms (e.g., wheelchair or bedside chair)?: None Help needed to walk in hospital room?: A Little Help needed climbing 3-5 steps with a railing? : A Little 6 Click Score: 22    End of Session Equipment Utilized During Treatment: Gait belt Activity Tolerance: Patient tolerated treatment well;Patient limited by fatigue Patient left: in bed;with nursing/sitter in room Nurse Communication: Mobility status PT Visit Diagnosis: Unsteadiness on feet (R26.81);Other abnormalities of gait and mobility (R26.89);Repeated falls (R29.6)     Time: 1610-9604 PT Time Calculation (min) (ACUTE ONLY): 21 min  Charges:    $Therapeutic Activity: 8-22 mins PT General  Charges $$ ACUTE PT VISIT: 1 Visit                     2:44 PM, 04/08/23 Ocie Bob, MPT Physical Therapist with [facility] [phone] office [phone] mobile phone

## 2023-04-08 NOTE — Consult Note (Signed)
Consultation Note Date: 04/08/2023   Patient Name: Karen Luna  DOB: 1931/09/01  MRN: [id number]  Age / Sex: 89 y.o., female  PCP: [facility] Referring Physician: Cleora Fleet, MD  Reason for Consultation: Establishing goals of care  HPI/Patient Profile: 89 y.o. female  with past medical history of dementia, HTN, HIV on antiretrovirals, CKD 3B, chronic large abdominal wall hernia which has been referred to outpatient surgery, anemia of chronic disease, LV hypertrophy, history of cervical and uterine cancer status post hysterectomy, chronic venous insufficiency, aortic atherosclerosis, recent hospitalized 11 25 through 26 for painless rectal bleeding and hyperkalemia with AKI admitted on 04/05/2023 with acute embolic CVA, severe sinus bradycardia with second-degree Mobitz type I AV block.   Clinical Assessment and Goals of Care: I have reviewed medical records including EPIC notes, labs and imaging, received report from RN, assessed the patient.  Karen Luna is sitting up in the bed in her room.  She appears acutely/chronically ill and very frail.  She will briefly make but not keep eye contact.  She has known dementia, but is able to tell me her name, not where we are.  She has a Comptroller at bedside due to impulsivity.  She is active with [facility].  Call to son, Karen Luna, to discuss diagnosis prognosis, GOC, EOL wishes, disposition and options.  No answer, left voicemail message.  Participants in the [facility] program are closely managed and monitored through their program.  [facility] has discussion about discharge whether returning home or to short-term rehab.  Transition care team here is working closely for disposition.  Advanced directives, concepts specific to code status, artifical feeding and hydration, and rehospitalization were considered and discussed.  Karen Luna  is listed as DNR.  Conference with attending, bedside nursing staff, transition of care team related to patient condition, needs, goals of care, disposition.  Active with [facility].   HCPOA  NEXT OF KIN    SUMMARY OF RECOMMENDATIONS   At this point continue to treat the treatable but no CPR or intubation Active with [facility] program PMT to shadow   Code Status/Advance Care Planning: DNR  Symptom Management:  Per hospitalist, no additional needs at this time.  Palliative Prophylaxis:  Frequent Pain Assessment, Oral Care, and Turn Reposition  Additional Recommendations (Limitations, Scope, Preferences): Continue to treat but no CPR or intubation  Psycho-social/Spiritual:  Desire for further Chaplaincy support:no Additional Recommendations: Caregiving  Support/Resources and Education on Hospice  Prognosis:  Unable to determine, based on outcomes.  6 months or less would not be surprising based on chronic illness burden, poor physical condition.  Discharge Planning: To be determined, active with [facility] program.      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 The following aspects are pertinent.  Past Medical History:  Diagnosis Date   Anemia of chronic disease 07/06/2011   Aortic atherosclerosis (HCC) 01/06/2015   Seen on CT scan, currently asymptomatic   Cancer (HCC)    uterine( per pt)   Chronic kidney disease, stage 3  (HCC) 09/01/2008   Chronic venous insufficiency 10/03/2012   Essential hypertension 09/19/2006   History of cervical cancer 08/11/2012   s/p hysterectomy, remote, specifics unknown    Human immunodeficiency virus (HIV) disease (HCC) 09/12/2006   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 07/25/2012   Low grade squamous intraepithelial lesion (LGSIL) on cervical Pap smear 08/06/2007   LGSIL: 08/06/2007, 11/05/2008, 05/11/2009 ASC-US: 05/27/2008, 08/31/2009, 05/24/2010, 05/23/2012 High Risk HPV: Detected    Onychomycosis of left great toe 09/09/2015   Overweight (BMI 25.0-29.9) 07/25/2012   Social History   Socioeconomic History   Marital status: Widowed    Spouse name: Not on file   Number of children: Not on file   Years of education: Not on file   Highest education level: Not on file  Occupational History   Not on file  Tobacco Use   Smoking status: Former    Current packs/day: 0.00    Types: Cigarettes    Quit date: 08/06/1988    Years since quitting: 34.6   Smokeless tobacco: Never  Vaping Use   Vaping status: Never Used  Substance and Sexual Activity   Alcohol use: No    Alcohol/week: 0.0 standard drinks of alcohol   Drug use: No   Sexual activity: Not on file  Other Topics Concern   Not on file  Social History Narrative   Not on file   Social Determinants of Health   Financial Resource Strain: Low Risk  (09/27/2022)   [facility]   Overall Financial Resource Strain (CARDIA)    Difficulty of Paying Living Expenses: Not hard at all  Food Insecurity: No Food Insecurity (04/05/2023)   Hunger Vital Sign    Worried About Running Out of Food in the Last Year: Never true    Ran Out of Food in the Last Year: Never true  Transportation Needs: No Transportation Needs (04/05/2023)   PRAPARE - Administrator, Civil Service (Medical): No    Lack of Transportation (Non-Medical): No  Physical Activity: Not on file  Stress: Not on file   Social Connections: Unknown (09/26/2022)   [facility] from [facility]   Social Network    Social Network: Not on file   Family History  Problem Relation Age of Onset   Hypertension Mother    Heart disease Mother    Hypertension Father    Heart disease Father    Hypertension Sister    HIV/AIDS Daughter    Early death Sister        Gun shot wound   Cerebrovascular Accident Son    Scheduled Meds:  amLODipine  10 mg Oral Daily   aspirin  81 mg Oral Daily   bictegravir-emtricitabine-tenofovir AF  1 tablet Oral Daily   Chlorhexidine Gluconate Cloth  6 each Topical Daily   citalopram  20 mg Oral Daily   clopidogrel  75 mg Oral Daily   memantine  21 mg Oral QHS   And   donepezil  10 mg Oral QHS   heparin  5,000 Units Subcutaneous Q8H   hydrALAZINE  25 mg Oral Q8H   Continuous Infusions: PRN Meds:.acetaminophen **OR** acetaminophen (TYLENOL) oral liquid 160 mg/5 mL **OR** acetaminophen, atropine, haloperidol lactate, LORazepam, senna-docusate Medications Prior to Admission:  Prior to Admission medications   Medication Sig Start Date End Date Taking? Authorizing Provider  acetaminophen (TYLENOL) 650 MG CR tablet Take 650 mg by mouth in the morning, at noon, and at bedtime.   Yes [provider]  amLODipine (NORVASC) 10 MG tablet Take 10 mg by mouth daily.   Yes [provider]  aspirin EC 81 MG tablet Take 81 mg by mouth daily. Swallow whole.   Yes [provider]  bictegravir-emtricitabine-tenofovir AF (BIKTARVY) 50-200-25 MG TABS tablet Take 1 tablet by mouth daily. 06/26/22  Yes Comer, Belia Heman, MD  Cholecalciferol (VITAMIN D3) 20 MCG (800 UNIT) TABS Take 1 tablet by mouth daily.   Yes [provider]  citalopram (CELEXA) 20 MG tablet Take 20 mg by mouth daily.    Yes [provider]  Ferrous Sulfate (IRON PO) Take 45 mg by mouth daily.   Yes [provider]  fexofenadine (ALLEGRA) 60 MG tablet Take 60 mg by mouth 2 (two) times  daily.   Yes [provider]  hydrALAZINE (APRESOLINE) 100 MG tablet Take 1 tablet (100 mg total) by mouth 3 (three) times daily. 09/09/15  Yes Doneen Poisson, MD  Memantine HCl-Donepezil HCl 21-10 MG CP24 Take 1 capsule by mouth every evening.   Yes [provider]   Allergies  Allergen Reactions   Clonidine Derivatives Other (See Comments)    Non-specific heart block   Review of Systems  Unable to perform ROS: Age    Physical Exam Vitals and nursing note reviewed.  Constitutional:      General: She is not in acute distress.    Appearance: She is ill-appearing.     Comments: Frail  HENT:     Mouth/Throat:     Mouth: Mucous membranes are moist.  Cardiovascular:     Rate and Rhythm: Normal rate.  Pulmonary:     Effort: Pulmonary effort is normal. No respiratory distress.  Musculoskeletal:     Comments: Frail and thin  Skin:    General: Skin is warm and dry.  Neurological:     Mental Status: She is alert.     Comments: Known dementia, oriented to self only  Psychiatric:        Mood and Affect: Mood normal.        Behavior: Behavior normal.     Comments: Calm and cooperative, not fearful     Vital Signs: BP (!) 147/52 (BP Location: Left Arm)   Pulse 99   Temp 98.5 F (36.9 C) (Oral)   Resp 20   Ht 5\' 8"  (1.727 m)   Wt 53.7 kg   SpO2 100%   BMI 18.00 kg/m  Pain Scale: 0-10   Pain Score: 0-No pain   SpO2: SpO2: 100 % O2 Device:SpO2: 100 % O2 Flow Rate: .   IO: Intake/output summary:  Intake/Output Summary (Last 24 hours) at 04/08/2023 0825 Last data filed at 04/07/2023 1300 Gross per 24 hour  Intake 120 ml  Output --  Net 120 ml    LBM: Last BM Date : 04/06/23 Baseline Weight: Weight: 52.8 kg Most recent weight: Weight: 53.7 kg     Palliative Assessment/Data:  Time In: 1000  Time Out: 1040 Time Total: 40 minutes  Greater than 50%  of this time was spent counseling and coordinating care related to the above assessment and  plan.  Signed by: Katheran Awe, NP   Please contact Palliative Medicine Team phone at [phone] for questions and concerns.  For individual provider: See Loretha Stapler

## 2023-04-08 NOTE — Discharge Instructions (Addendum)
PLEASE TAKE ASPIRIN AND PLAVIX FOR 90 DAYS FOLLOWED BY PLAVIX ALONE  PLEASE STOP TAKING DONEPEZIL DUE TO CAUSING BRADYCARDIA   IMPORTANT INFORMATION: PAY CLOSE ATTENTION   PHYSICIAN DISCHARGE INSTRUCTIONS  Follow with Primary care provider  [facility]  and other consultants as instructed by your Hospitalist Physician  SEEK MEDICAL CARE OR RETURN TO EMERGENCY ROOM IF SYMPTOMS COME BACK, WORSEN OR NEW PROBLEM DEVELOPS   Please note: You were cared for by a hospitalist during your hospital stay. Every effort will be made to forward records to your primary care provider.  You can request that your primary care provider send for your hospital records if they have not received them.  Once you are discharged, your primary care physician will handle any further medical issues. Please note that NO REFILLS for any discharge medications will be authorized once you are discharged, as it is imperative that you return to your primary care physician (or establish a relationship with a primary care physician if you do not have one) for your post hospital discharge needs so that they can reassess your need for medications and monitor your lab values.  Please get a complete blood count and chemistry panel checked by your Primary MD at your next visit, and again as instructed by your Primary MD.  Get Medicines reviewed and adjusted: Please take all your medications with you for your next visit with your Primary MD  Laboratory/radiological data: Please request your Primary MD to go over all hospital tests and procedure/radiological results at the follow up, please ask your primary care provider to get all Hospital records sent to his/her office.  In some cases, they will be blood work, cultures and biopsy results pending at the time of your discharge. Please request that your primary care provider follow up on these results.  If you are diabetic, please bring your blood sugar  readings with you to your follow up appointment with primary care.    Please call and make your follow up appointments as soon as possible.    Also Note the following: If you experience worsening of your admission symptoms, develop shortness of breath, life threatening emergency, suicidal or homicidal thoughts you must seek medical attention immediately by calling 911 or calling your MD immediately  if symptoms less severe.  You must read complete instructions/literature along with all the possible adverse reactions/side effects for all the Medicines you take and that have been prescribed to you. Take any new Medicines after you have completely understood and accpet all the possible adverse reactions/side effects.   Do not drive when taking Pain medications or sleeping medications (Benzodiazepines)  Do not take more than prescribed Pain, Sleep and Anxiety Medications. It is not advisable to combine anxiety,sleep and pain medications without talking with your primary care practitioner  Special Instructions: If you have smoked or chewed Tobacco  in the last 2 yrs please stop smoking, stop any regular Alcohol  and or any Recreational drug use.  Wear Seat belts while driving.  Do not drive if taking any narcotic, mind altering or controlled substances or recreational drugs or alcohol.

## 2023-04-08 NOTE — Telephone Encounter (Signed)
30 day monitor order entered.

## 2023-04-08 NOTE — Plan of Care (Signed)
  Problem: Activity: Goal: Risk for activity intolerance will decrease Outcome: Progressing   Problem: Coping: Goal: Level of anxiety will decrease Outcome: Progressing   

## 2023-04-08 NOTE — Care Management Important Message (Signed)
Important Message  Patient Details  Name: Karen Luna MRN: [id number] Date of Birth: 09-15-1931   Important Message Given:  N/A - LOS <3 / Initial given by admissions     Corey Harold 04/08/2023, 2:22 PM

## 2023-04-08 NOTE — Progress Notes (Signed)
Speech Language Pathology Treatment: Dysphagia  Patient Details Name: Karen Luna MRN: [id number] DOB: January 22, 1932 Today's Date: 04/08/2023 Time: 4098-1191 SLP Time Calculation (min) (ACUTE ONLY): 22 min  Assessment / Plan / Recommendation Clinical Impression  Ongoing diagnostic dysphagia treatment provided; Pt consumed thin liquids via cup (16 oz) without overt s/sx of oropharyngeal dysphagia. Pt then was provided thin liquids via straw and note delayed congested wet coughing. Pt is very impulsive and requires cues not to drink liquids very quickly. Pt consumed puree without overt s/sx of oropharyngeal dysphagia. Recommend continue with D2/fine chop diet and upgrade to thin liquids. Pt is very impulsive and note consistent coughing with straws so recommend NO STRAWS. Meds are ok whole with puree or crush if needed. MD reports d/c later today, recommend ST f/u for speech and dysphagia after d/c. Thank you for allowing [place] to participate in the care of this patient,  Sitter did report some coughing with meals and question if this is related to impulsivity.  Continue to recommend 1:1 support for meals and verbal cues to slow down and alternate bites and sips.    HPI HPI: Karen Luna is a 89 y/o female with dementia, HTN, HIV on antiretrovirals, stage 3b CKD, chronic large abdominal wall hernia, anemia of chronic disease, LV hypertrophy, history of cervical and uterine cancer, s/p hysterectomy, chronic venous insufficiency, aortic atherosclerosis who was recently hospitalized at [facility] 11/25-11/26 for an episode of painless rectal bleeding and hyperkalemia with AKI.  She recovered from that and the bleeding had stopped and she was discharged back home.  She was brought in today by son from home with concern for CVA.  She was last noted to be normal yesterday at 6pm.  This morning she is aphasic with a left sided facial droop and weakness in her right lower extremity.  Code stroke protocol in the ED and  was seen by teleneurologist and MRI confimed acute infarct involving the left insula and left frontal operculum.  Pt has new findings of dysphagia.  Pt is being admitted for full stroke work up per neurology recommendations.      SLP Plan  Continue with current plan of care      Recommendations for follow up therapy are one component of a multi-disciplinary discharge planning process, led by the attending physician.  Recommendations may be updated based on patient status, additional functional criteria and insurance authorization.    Recommendations  Diet recommendations: Dysphagia 2 (fine chop);Thin liquid Liquids provided via: Cup;No straw Medication Administration: Crushed with puree Supervision: Patient able to self feed Compensations: Minimize environmental distractions;Slow rate;Small sips/bites Postural Changes and/or Swallow Maneuvers: Seated upright 90 degrees                  Oral care QID     Apraxia (R48.2);Aphasia (R47.01);Dysphagia, oropharyngeal phase (R13.12)     Continue with current plan of care   Ailey Wessling H. Romie Levee, CCC-SLP Speech Language Pathologist   Georgetta Haber  04/08/2023, 1:43 PM

## 2023-04-08 NOTE — Evaluation (Signed)
Occupational Therapy Evaluation Patient Details Name: Karen Luna MRN: [id number] DOB: 12/13/1931 Today's Date: 04/08/2023   History of Present Illness Karen Luna is a 89 y/o female with dementia, HTN, HIV on antiretrovirals, stage 3b CKD, chronic large abdominal wall hernia, anemia of chronic disease, LV hypertrophy, history of cervical and uterine cancer, s/p hysterectomy, chronic venous insufficiency, aortic atherosclerosis who was recently hospitalized at [facility] 11/25-11/26 for an episode of painless rectal bleeding and hyperkalemia with AKI.  She recovered from that and the bleeding had stopped and she was discharged back home.  She was brought in today by son from home with concern for CVA.  She was last noted to be normal yesterday at 6pm.  This morning she is aphasic with a left sided facial droop and weakness in her right lower extremity.  Code stroke protocol in the ED and was seen by teleneurologist and MRI confimed acute infarct involving the left insula and left frontal operculum.  Pt has new findings of dysphagia.  Pt is being admitted for full stroke work up per neurology recommendations.   Clinical Impression   Pt agreeable to OT evaluation, sitter with pt on OT arrival, stepped out during evaluation. Pt demonstrating BUE ROM and strength WNL, good coordination. No visual deficits with vision assessment. Per sitter pt eating independently, getting up and ambulating to restroom without difficulty. Pt requiring supervision and cuing due to cognitive status. Recommend 24/7 supervision on return home, no further OT services required at this time.        If plan is discharge home, recommend the following: A little help with walking and/or transfers;A little help with bathing/dressing/bathroom;Supervision due to cognitive status    Functional Status Assessment  Patient has had a recent decline in their functional status and demonstrates the ability to make significant improvements  in function in a reasonable and predictable amount of time.  Equipment Recommendations  None recommended by OT       Precautions / Restrictions Precautions Precautions: Fall Restrictions Weight Bearing Restrictions: No      Mobility Bed Mobility               General bed mobility comments: Not observed    Transfers                   General transfer comment: not completed          ADL either performed or assessed with clinical judgement   ADL Overall ADL's : Needs assistance/impaired Eating/Feeding: Modified independent;Bed level   Grooming: Set up;Bed level                                 General ADL Comments: Pt requiring assistance due to cognition and impulsiveness. Decreased accuracy following commands.     Vision Baseline Vision/History: 1 Wears glasses Ability to See in Adequate Light: 1 Impaired Patient Visual Report: No change from baseline Vision Assessment?: No apparent visual deficits            Pertinent Vitals/Pain Pain Assessment Pain Assessment: No/denies pain     Extremity/Trunk Assessment Upper Extremity Assessment Upper Extremity Assessment: Overall WFL for tasks assessed   Lower Extremity Assessment Lower Extremity Assessment: Defer to PT evaluation   Cervical / Trunk Assessment Cervical / Trunk Assessment: Normal   Communication Communication Communication: Difficulty following commands/understanding Following commands: Follows one step commands inconsistently;Follows multi-step commands inconsistently Cueing Techniques: Verbal cues;Gestural cues  Cognition Arousal: Alert Behavior During Therapy: WFL for tasks assessed/performed Overall Cognitive Status: Within Functional Limits for tasks assessed                                                  Home Living Family/patient expects to be discharged to:: Private residence Living Arrangements: Alone Available Help at Discharge:  Family;Available PRN/intermittently Type of Home: Apartment Home Access: Stairs to enter Entrance Stairs-Number of Steps: 3 Entrance Stairs-Rails: Right;Left;Can reach both Home Layout: One level     Bathroom Shower/Tub: Chief Strategy Officer: Standard     Home Equipment: Grab bars - tub/shower      Lives With: Alone    Prior Functioning/Environment Prior Level of Function : Needs assist             Mobility Comments: Household and short distanced community ambulation without AD ADLs Comments: Assisted by family, pace program 4 hours/day on MWF, home aides 2 hours/day on Tuesday, Thursday        OT Problem List: Decreased cognition       AM-PAC OT "6 Clicks" Daily Activity     Outcome Measure Help from another person eating meals?: None Help from another person taking care of personal grooming?: A Little Help from another person toileting, which includes using toliet, bedpan, or urinal?: A Little Help from another person bathing (including washing, rinsing, drying)?: A Little Help from another person to put on and taking off regular upper body clothing?: A Little Help from another person to put on and taking off regular lower body clothing?: A Little 6 Click Score: 19   End of Session    Activity Tolerance: Patient tolerated treatment well Patient left: in bed;with call bell/phone within reach;with nursing/sitter in room  OT Visit Diagnosis: Muscle weakness (generalized) (M62.81)                Time: 1330-1340 OT Time Calculation (min): 10 min Charges:  OT General Charges $OT Visit: 1 Visit  Ezra Sites, OTR/L  [phone] 04/08/2023, 2:10 PM

## 2023-04-08 NOTE — Consult Note (Addendum)
CARDIOLOGY CONSULT NOTE    Patient ID: Karen Luna; [id number]; 03/30/1932   Admit date: 04/05/2023 Date of Consult: 04/08/2023  Primary Care Provider: [facility], [facility] Primary Cardiologist:  Primary Electrophysiologist:    History of Present Illness:   Karen Luna is a 89 year old F known to have HTN, chronic renal insufficiency, Alzheimer's dementia on memantine and donepezil was brought to the ER after she was found down by her neighbors and started speech. She was found to have imaging evidence of acute embolic CVA and was started on DAPT per neurology recommendations. Carotid Doppler ultrasound was performed and showed R ICA 50 to 69% stenosis.  She is alert and oriented to self.  She also received atropine multiple times throughout this hospitalization for HR 30s, asymptomatic.  She denies having any dizziness in the last 1 year however did report having dizziness prior to syncope.  Unclear reliability on her history due to dementia (AAO x 0).  No chest pains, SOB.  Lives at home by herself.  Independent.  Cooks meals daily and eats 2 meals a day.  Past Medical History:  Diagnosis Date   Anemia of chronic disease 07/06/2011   Aortic atherosclerosis (HCC) 01/06/2015   Seen on CT scan, currently asymptomatic   Cancer (HCC)    uterine( per pt)   Chronic kidney disease, stage 3 (HCC) 09/01/2008   Chronic venous insufficiency 10/03/2012   Essential hypertension 09/19/2006   History of cervical cancer 08/11/2012   s/p hysterectomy, remote, specifics unknown    Human immunodeficiency virus (HIV) disease (HCC) 09/12/2006   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 07/25/2012   Low grade squamous intraepithelial lesion (LGSIL) on cervical Pap smear 08/06/2007   LGSIL: 08/06/2007, 11/05/2008, 05/11/2009 ASC-US: 05/27/2008, 08/31/2009, 05/24/2010, 05/23/2012 High Risk HPV: Detected    Onychomycosis of left great toe 09/09/2015   Overweight (BMI 25.0-29.9) 07/25/2012     Past Surgical History:  Procedure Laterality Date   ABDOMINAL HYSTERECTOMY         Inpatient Medications: Scheduled Meds:  amLODipine  10 mg Oral Daily   aspirin  81 mg Oral Daily   bictegravir-emtricitabine-tenofovir AF  1 tablet Oral Daily   Chlorhexidine Gluconate Cloth  6 each Topical Daily   citalopram  20 mg Oral Daily   clopidogrel  75 mg Oral Daily   memantine  21 mg Oral QHS   And   donepezil  10 mg Oral QHS   heparin  5,000 Units Subcutaneous Q8H   hydrALAZINE  25 mg Oral Q8H   Continuous Infusions:  PRN Meds: acetaminophen **OR** acetaminophen (TYLENOL) oral liquid 160 mg/5 mL **OR** acetaminophen, atropine, haloperidol lactate, LORazepam, senna-docusate  Allergies:    Allergies  Allergen Reactions   Clonidine Derivatives Other (See Comments)    Non-specific heart block    Social History:   Social History   Socioeconomic History   Marital status: Widowed    Spouse name: Not on file   Number of children: Not on file   Years of education: Not on file   Highest education level: Not on file  Occupational History   Not on file  Tobacco Use   Smoking status: Former    Current packs/day: 0.00    Types: Cigarettes    Quit date: 08/06/1988    Years since quitting: 34.6   Smokeless tobacco: Never  Vaping Use   Vaping status: Never Used  Substance and Sexual Activity   Alcohol use: No  Alcohol/week: 0.0 standard drinks of alcohol   Drug use: No   Sexual activity: Not on file  Other Topics Concern   Not on file  Social History Narrative   Not on file   Social Determinants of Health   Financial Resource Strain: Low Risk  (09/27/2022)   Received from [facility]   Overall Financial Resource Strain (CARDIA)    Difficulty of Paying Living Expenses: Not hard at all  Food Insecurity: No Food Insecurity (04/05/2023)   Hunger Vital Sign    Worried About Running Out of Food in the Last Year: Never true    Ran Out of Food in the  Last Year: Never true  Transportation Needs: No Transportation Needs (04/05/2023)   PRAPARE - Administrator, Civil Service (Medical): No    Lack of Transportation (Non-Medical): No  Physical Activity: Not on file  Stress: Not on file  Social Connections: Unknown (09/26/2022)   Received from [facility]   Social Network    Social Network: Not on file  Intimate Partner Violence: Not At Risk (04/05/2023)   Humiliation, Afraid, Rape, and Kick questionnaire    Fear of Current or Ex-Partner: No    Emotionally Abused: No    Physically Abused: No    Sexually Abused: No    Family History:    Family History  Problem Relation Age of Onset   Hypertension Mother    Heart disease Mother    Hypertension Father    Heart disease Father    Hypertension Sister    HIV/AIDS Daughter    Early death Sister        Gun shot wound   Cerebrovascular Accident Son      ROS:  Please see the history of present illness.  ROS  All other ROS reviewed and negative.     Physical Exam/Data:   Vitals:   04/07/23 1810 04/08/23 0506 04/08/23 0508 04/08/23 0932  BP: (!) 153/63 (!) 147/52 (!) 147/52 (!) 133/51  Pulse: (!) 49  99   Resp: 20   20  Temp: 98.5 F (36.9 C)   98.3 F (36.8 C)  TempSrc: Oral   Oral  SpO2: 100%  100% 100%  Weight:      Height:        Intake/Output Summary (Last 24 hours) at 04/08/2023 1009 Last data filed at 04/07/2023 1300 Gross per 24 hour  Intake 120 ml  Output --  Net 120 ml   Filed Weights   04/05/23 0820 04/05/23 1400  Weight: 52.8 kg 53.7 kg   Body mass index is 18 kg/m.  General:  Well nourished, well developed, in no acute distress HEENT: normal Lymph: no adenopathy Neck: no JVD Endocrine:  No thryomegaly Vascular: No carotid bruits; FA pulses 2+ bilaterally without bruits  Cardiac:  normal S1, S2; RRR; no murmur  Lungs:  clear to auscultation bilaterally, no wheezing, rhonchi or rales  Abd: soft, nontender, no hepatomegaly  Ext: no  edema Musculoskeletal:  No deformities, BUE and BLE strength normal and equal Skin: warm and dry  Psych:  AAO x 0  EKG:  The EKG was personally reviewed and demonstrates:   Telemetry:  Telemetry was personally reviewed and demonstrates:     Laboratory Data:  Chemistry Recent Labs  Lab 04/05/23 0840 04/05/23 0842  NA 136 139  K 4.6 4.7  CL 103 107  CO2 22  --   GLUCOSE 98 94  BUN 34* 31*  CREATININE 1.73* 1.90*  CALCIUM 10.3  --   GFRNONAA 28*  --   ANIONGAP 11  --     Recent Labs  Lab 04/05/23 0840  PROT 8.0  ALBUMIN 4.1  AST 31  ALT 20  ALKPHOS 69  BILITOT 0.7   Hematology Recent Labs  Lab 04/05/23 0840 04/05/23 0842 04/06/23 0549  WBC 6.8  --  11.3*  RBC 2.39*  --  2.31*  HGB 8.6* 9.5* 8.4*  HCT 26.6* 28.0* 25.6*  MCV 111.3*  --  110.8*  MCH 36.0*  --  36.4*  MCHC 32.3  --  32.8  RDW 13.2  --  13.1  PLT 351  --  330   Cardiac EnzymesNo results for input(s): "TROPONINI" in the last 168 hours. No results for input(s): "TROPIPOC" in the last 168 hours.  BNPNo results for input(s): "BNP", "PROBNP" in the last 168 hours.  DDimer No results for input(s): "DDIMER" in the last 168 hours.  Radiology/Studies:  DG CHEST PORT 1 VIEW  Result Date: 04/06/2023 CLINICAL DATA:  Aspiration. EXAM: PORTABLE CHEST 1 VIEW COMPARISON:  Chest radiograph dated 09/26/2022. FINDINGS: The heart is enlarged. Vascular calcifications are seen in the aortic arch. Mild left basilar atelectasis/airspace disease. The right lung is clear. No pleural effusion or pneumothorax. Degenerative changes are seen in the spine. IMPRESSION: Mild left basilar atelectasis/airspace disease. Electronically Signed   By: Romona Curls M.D.   On: 04/06/2023 12:03   ECHOCARDIOGRAM COMPLETE BUBBLE STUDY  Result Date: 04/05/2023    ECHOCARDIOGRAM REPORT   Patient Name:   Karen Luna Date of Exam: 04/05/2023 Medical Rec #:  [id number]        Height:       68.0 in Accession #:    [id number]       Weight:        118.4 lb Date of Birth:  [birth year]        BSA:          1.635 m Patient Age:    89 years         BP:           168/59 mmHg Patient Gender: F                HR:           63 bpm. [facility] Procedure: 2D Echo, Cardiac Doppler and Color Doppler Indications:    Stroke 434.91 / I63.9  History:        Patient has prior history of Echocardiogram examinations. Risk                 Factors:Hypertension and Former Smoker.  Sonographer:    Celesta Gentile RCS Referring Phys: [id number] PRIYANKA O YADAV IMPRESSIONS  1. Left ventricular ejection fraction, by estimation, is 65 to 70%. The left ventricle has normal function. The left ventricle has no regional wall motion abnormalities. There is mild concentric left ventricular hypertrophy. Left ventricular diastolic parameters are indeterminate.  2. Right ventricular systolic function is normal. The right ventricular size is normal. There is mildly elevated pulmonary artery systolic pressure. The estimated right ventricular systolic pressure is 37.1 mmHg.  3. Left atrial size was severely dilated.  4. Right atrial size was moderately dilated.  5. The mitral valve is degenerative. Mild mitral valve regurgitation.  6. Tricuspid valve regurgitation is mild to moderate.  7. The aortic valve is tricuspid. There is mild calcification of the aortic valve. Aortic valve  regurgitation is not visualized. Aortic valve sclerosis/calcification is present, without any evidence of aortic stenosis. Aortic valve mean gradient measures 8.0 mmHg. Dimentionless index 0.72.  8. The inferior vena cava is normal in size with greater than 50% respiratory variability, suggesting right atrial pressure of 3 mmHg.  9. Agitated saline contrast bubble study was negative, with no evidence of any interatrial shunt. Comparison(s): No prior Echocardiogram. FINDINGS  Left Ventricle: Left ventricular ejection fraction, by estimation, is 65 to 70%. The left ventricle has normal function. The left  ventricle has no regional wall motion abnormalities. The left ventricular internal cavity size was normal in size. There is  mild concentric left ventricular hypertrophy. Left ventricular diastolic function could not be evaluated due to atrial fibrillation. Left ventricular diastolic parameters are indeterminate. Right Ventricle: The right ventricular size is normal. No increase in right ventricular wall thickness. Right ventricular systolic function is normal. There is mildly elevated pulmonary artery systolic pressure. The tricuspid regurgitant velocity is 2.92  m/s, and with an assumed right atrial pressure of 3 mmHg, the estimated right ventricular systolic pressure is 37.1 mmHg. Left Atrium: Left atrial size was severely dilated. Right Atrium: Right atrial size was moderately dilated. Pericardium: Trivial pericardial effusion is present. The pericardial effusion is posterior to the left ventricle. Mitral Valve: The mitral valve is degenerative in appearance. Mild mitral valve regurgitation. Tricuspid Valve: The tricuspid valve is grossly normal. Tricuspid valve regurgitation is mild to moderate. Aortic Valve: The aortic valve is tricuspid. There is mild calcification of the aortic valve. There is mild aortic valve annular calcification. Aortic valve regurgitation is not visualized. Aortic valve sclerosis/calcification is present, without any evidence of aortic stenosis. Aortic valve mean gradient measures 8.0 mmHg. Aortic valve peak gradient measures 17.6 mmHg. Aortic valve area, by VTI measures 1.83 cm. Pulmonic Valve: The pulmonic valve was grossly normal. Pulmonic valve regurgitation is trivial. Aorta: The aortic root is normal in size and structure. Venous: The inferior vena cava is normal in size with greater than 50% respiratory variability, suggesting right atrial pressure of 3 mmHg. IAS/Shunts: No atrial level shunt detected by color flow Doppler. Agitated saline contrast was given intravenously to  evaluate for intracardiac shunting. Agitated saline contrast bubble study was negative, with no evidence of any interatrial shunt.  LEFT VENTRICLE PLAX 2D LVIDd:         5.00 cm LVIDs:         3.00 cm LV PW:         1.40 cm LV IVS:        1.10 cm LVOT diam:     1.80 cm LV SV:         81 LV SV Index:   50 LVOT Area:     2.54 cm  RIGHT VENTRICLE TAPSE (M-mode): 2.8 cm LEFT ATRIUM              Index        RIGHT ATRIUM           Index LA diam:        4.60 cm  2.81 cm/m   RA Area:     23.20 cm LA Vol (A2C):   149.0 ml 91.11 ml/m  RA Volume:   74.00 ml  45.25 ml/m LA Vol (A4C):   153.0 ml 93.55 ml/m LA Biplane Vol: 152.0 ml 92.94 ml/m  AORTIC VALVE AV Area (Vmax):    1.81 cm AV Area (Vmean):   1.84 cm AV Area (VTI):  1.83 cm AV Vmax:           210.00 cm/s AV Vmean:          121.000 cm/s AV VTI:            0.444 m AV Peak Grad:      17.6 mmHg AV Mean Grad:      8.0 mmHg LVOT Vmax:         149.00 cm/s LVOT Vmean:        87.300 cm/s LVOT VTI:          0.319 m LVOT/AV VTI ratio: 0.72  AORTA Ao Root diam: 2.70 cm MITRAL VALVE                TRICUSPID VALVE MV Area (PHT): 4.26 cm     TR Peak grad:   34.1 mmHg MV Decel Time: 178 msec     TR Vmax:        292.00 cm/s MV E velocity: 145.50 cm/s MV A velocity: 62.40 cm/s   SHUNTS MV E/A ratio:  2.33         Systemic VTI:  0.32 m                             Systemic Diam: 1.80 cm Nona Dell MD Electronically signed by Nona Dell MD Signature Date/Time: 04/05/2023/4:19:33 PM    Final    US Carotid Bilateral  Result Date: 04/05/2023 CLINICAL DATA:  89 year old female with a history of stroke and left-sided facial droop EXAM: BILATERAL CAROTID DUPLEX ULTRASOUND TECHNIQUE: Wallace Cullens scale imaging, color Doppler and duplex ultrasound were performed of bilateral carotid and vertebral arteries in the neck. COMPARISON:  None Available. FINDINGS: Criteria: Quantification of carotid stenosis is based on velocity parameters that correlate the residual internal carotid  diameter with NASCET-based stenosis levels, using the diameter of the distal internal carotid lumen as the denominator for stenosis measurement. The following velocity measurements were obtained: RIGHT ICA:  Systolic 174 cm/sec, Diastolic 43 cm/sec CCA:  108 cm/sec SYSTOLIC ICA/CCA RATIO:  1.6 ECA:  92 cm/sec LEFT ICA:  Systolic 101 cm/sec, Diastolic 22 cm/sec CCA:  92 cm/sec SYSTOLIC ICA/CCA RATIO:  1.1 ECA:  103 cm/sec Right Brachial SBP: Not acquired Left Brachial SBP: Not acquired RIGHT CAROTID ARTERY: No significant calcifications of the right common carotid artery. Intermediate waveform maintained. Moderate heterogeneous and partially calcified plaque at the right carotid bifurcation. Shadowing present. Low resistance waveform of the right ICA. Tortuosity RIGHT VERTEBRAL ARTERY: Antegrade flow with low resistance waveform. LEFT CAROTID ARTERY: No significant calcifications of the left common carotid artery. Intermediate waveform maintained. Moderate heterogeneous and partially calcified plaque at the left carotid bifurcation. Shadowing present. Low resistance waveform of the left ICA. Tortuosity LEFT VERTEBRAL ARTERY:  Antegrade flow with low resistance waveform. IMPRESSION: Right: Heterogeneous and partially calcified plaque at the right carotid bifurcation contributes to 50%-69% stenosis by established duplex criteria. Left: Color duplex indicates moderate heterogeneous and calcified plaque, with no hemodynamically significant stenosis by duplex criteria in the extracranial cerebrovascular circulation. Signed, Yvone Neu. Miachel Roux, RPVI [facility] Radiology Electronically Signed   By: Gilmer Mor D.O.   On: 04/05/2023 13:35   MR BRAIN WO CONTRAST  Result Date: 04/05/2023 CLINICAL DATA:  Aphasia. EXAM: MRI HEAD WITHOUT CONTRAST MRA HEAD WITHOUT CONTRAST TECHNIQUE: Multiplanar, multi-echo pulse sequences of the brain and surrounding structures were  acquired without intravenous contrast. Angiographic images  of the Circle of Willis were acquired using MRA technique without intravenous contrast. COMPARISON:  Head CT April 05, 2023. FINDINGS: MRI HEAD FINDINGS Brain: Area of restricted diffusion involving the left insula and frontal operculum with corresponding faint T2 hyperintensity, consistent with acute infarct. No hemorrhage, hydrocephalus, mass or extra-axial collection. Scattered and confluent foci of T2 hyperintensity are seen within the white matter of the cerebral hemispheres nonspecific, likely related chronic small vessel ischemia. Moderate parenchymal volume loss. Vascular: The major flow voids at the skull base are maintained. Skull and upper cervical spine: Degenerative changes of the cervical spine with mild anterolisthesis of C2 over C3. No focal marrow lesion. Sinuses/Orbits: Chronic opacification of the right maxillary sinus. The orbits are maintained. Other: None. MRA HEAD FINDINGS Anterior circulation: Patent bilateral intracranial internal carotid arteries with severe stenosis at the paraclinoid segment of the right ICA and mild-to-moderate stenosis at the proximal petrous segment of the left ICA. Ectatic appearance of cavernous right ICA with a 3 mm laterally projecting outpouching suggestive of an small (extradural) aneurysm. The bilateral M1 segments are patent. Occlusion of a distal left M2/MCA branch to the area of acute stroke. Posterior circulation: The bilateral intracranial vertebral arteries and basilar artery are patent without evidence of high-grade stenosis. The right posterior cerebral artery has normal flow related enhancement. There is a high-grade stenosis at the distal left P2/PCA. Anatomic variants: None significant. IMPRESSION: 1. Acute infarct involving the left insula and left frontal operculum. 2. Occlusion of a distal left M2/MCA branch to the area of acute stroke. 3. Severe stenosis at the paraclinoid segment of the  right ICA and mild-to-moderate stenosis at the proximal petrous segment of the left ICA. 4. High-grade stenosis at the distal left P2/PCA. 5. Ectatic appearance of cavernous right ICA with a 3 mm laterally projecting outpouching suggestive of an small (extradural) aneurysm. These results were called by telephone at the time of interpretation on 04/05/2023 at 11:18 am to provider [facility] , who verbally acknowledged these results. Electronically Signed   By: Baldemar Lenis M.D.   On: 04/05/2023 11:20   MR ANGIO HEAD WO CONTRAST  Result Date: 04/05/2023 CLINICAL DATA:  Aphasia. EXAM: MRI HEAD WITHOUT CONTRAST MRA HEAD WITHOUT CONTRAST TECHNIQUE: Multiplanar, multi-echo pulse sequences of the brain and surrounding structures were acquired without intravenous contrast. Angiographic images of the Circle of Willis were acquired using MRA technique without intravenous contrast. COMPARISON:  Head CT April 05, 2023. FINDINGS: MRI HEAD FINDINGS Brain: Area of restricted diffusion involving the left insula and frontal operculum with corresponding faint T2 hyperintensity, consistent with acute infarct. No hemorrhage, hydrocephalus, mass or extra-axial collection. Scattered and confluent foci of T2 hyperintensity are seen within the white matter of the cerebral hemispheres nonspecific, likely related chronic small vessel ischemia. Moderate parenchymal volume loss. Vascular: The major flow voids at the skull base are maintained. Skull and upper cervical spine: Degenerative changes of the cervical spine with mild anterolisthesis of C2 over C3. No focal marrow lesion. Sinuses/Orbits: Chronic opacification of the right maxillary sinus. The orbits are maintained. Other: None. MRA HEAD FINDINGS Anterior circulation: Patent bilateral intracranial internal carotid arteries with severe stenosis at the paraclinoid segment of the right ICA and mild-to-moderate stenosis at the proximal petrous segment of the left  ICA. Ectatic appearance of cavernous right ICA with a 3 mm laterally projecting outpouching suggestive of an small (extradural) aneurysm. The bilateral M1 segments are patent. Occlusion of a distal left M2/MCA branch to the area of  acute stroke. Posterior circulation: The bilateral intracranial vertebral arteries and basilar artery are patent without evidence of high-grade stenosis. The right posterior cerebral artery has normal flow related enhancement. There is a high-grade stenosis at the distal left P2/PCA. Anatomic variants: None significant. IMPRESSION: 1. Acute infarct involving the left insula and left frontal operculum. 2. Occlusion of a distal left M2/MCA branch to the area of acute stroke. 3. Severe stenosis at the paraclinoid segment of the right ICA and mild-to-moderate stenosis at the proximal petrous segment of the left ICA. 4. High-grade stenosis at the distal left P2/PCA. 5. Ectatic appearance of cavernous right ICA with a 3 mm laterally projecting outpouching suggestive of an small (extradural) aneurysm. These results were called by telephone at the time of interpretation on 04/05/2023 at 11:18 am to provider [facility] , who verbally acknowledged these results. Electronically Signed   By: Baldemar Lenis M.D.   On: 04/05/2023 11:20   CT HEAD WO CONTRAST ( )  Result Date: 04/05/2023 CLINICAL DATA:  Aphasia and facial droop. EXAM: CT HEAD WITHOUT CONTRAST TECHNIQUE: Contiguous axial images were obtained from the base of the skull through the vertex without intravenous contrast. RADIATION DOSE REDUCTION: This exam was performed according to the departmental dose-optimization program which includes automated exposure control, adjustment of the mA and/or kV according to patient size and/or use of iterative reconstruction technique. COMPARISON:  Head CT 12/10/2022 and MRI 10/04/2022 FINDINGS: Brain: There is no evidence of an acute infarct, intracranial hemorrhage, mass, midline  shift, or extra-axial fluid collection. Mild cerebral atrophy is within limits for age. Patchy to confluent hypodensities in the cerebral white matter are similar to the prior CT and are nonspecific but compatible with moderate chronic small vessel ischemic disease. Vascular: Calcified atherosclerosis at the skull base. No hyperdense vessel. Skull: No acute fracture or suspicious osseous lesion. Sinuses/Orbits: Chronic right maxillary sinusitis with partially visualized material chronically extending into the nasal cavity. Clear mastoid air cells. Unremarkable included orbits. Other: None. IMPRESSION: 1. No evidence of acute intracranial abnormality. 2. Moderate chronic small vessel ischemic disease. Electronically Signed   By: Sebastian Ache M.D.   On: 04/05/2023 10:07    Assessment and Plan:   Acute embolic CVA Severe sinus bradycardia Second degree Mobitz type I AV block and 2:1 AV block Alzheimer's dementia (AAO x 0)    -Presented with syncope and slurred speech, was found to have acute embolic CVA and carotid Doppler remarkable for R ICA 50 to 69% stenosis. Currently on DAPT for 3 months followed by Plavix monotherapy, per neurology recommendations. 30-day event monitor order was placed to rule out atrial arrhythmias.  I reviewed all the EKGs that showed severe sinus bradycardia, second-degree Mobitz type I AV block, junctional escape beat and 2:1 AV block.  No high-grade AV block warranting PPM implantation at this time. She received atropine multiple times during this hospitalization for asymptomatic slower heart rates. She is to wear 30-day event monitor for CVA indication which will provide more information on the underlying conduction abnormalities.  On donepezil at home which was held during this hospitalization.  Continue to hold donepezil upon discharge.  Echo on admission showed normal LVEF, no valvular heart disease except for mild to moderate TR.   For questions or updates, please  contact [facility] Please consult [email or web address] for contact info under Cardiology/STEMI.   Signed, Herbert Deaner, MD 04/08/2023 10:09 AM

## 2023-04-08 NOTE — Discharge Summary (Signed)
Physician Discharge Summary  Karen Luna ZOX:[id number] DOB: [birth year] DOA: 04/05/2023  PCP: [facility]  Admit date: 04/05/2023 Discharge date: 04/08/2023  Admitted From:  HOME  Disposition:  HOME WITH HH   Recommendations for Outpatient Follow-up:  Follow up with PCP in 1 weeks Follow up with neurology in 4 weeks Follow up with vascular surgery as scheduled Follow up with cardiology in 1 month  30 day cardiac monitor ordered prior to discharge arranged by [facility] office [place]  Please take aspirin and plavix for 90 days followed by plavix monotherapy   Please stop taking donepezil due to bradycardia  Outpatient Speech Therapy recommended   Home Health: PT, SLP   Discharge Condition: STABLE   CODE STATUS: DNR DIET: Dysphagia 2 diet thin liquids, crush meds in puree, NO STRAWS   Brief Hospitalization Summary: Please see all hospital notes, images, labs for full details of the hospitalization. Admission provider HPI:  89 y/o female with dementia, HTN, HIV on antiretrovirals, stage 3b CKD, chronic large abdominal wall hernia, anemia of chronic disease, LV hypertrophy, history of cervical and uterine cancer, s/p hysterectomy, chronic venous insufficiency, aortic atherosclerosis who was recently hospitalized at [facility] 11/25-11/26 for an episode of painless rectal bleeding and hyperkalemia with AKI. She recovered from that and the bleeding had stopped and she was discharged back home. She was brought in today by son from home with concern for CVA. She was last noted to be normal yesterday at 6pm. This morning she is aphasic with a left sided facial droop and weakness in her right lower extremity. Code stroke protocol in the ED and was seen by teleneurologist and MRI confimed acute infarct involving the left insula and left frontal operculum. Pt has new findings of dysphagia. Pt is being admitted for full stroke work up per neurology recommendations.    Hospital Course by problem list   Acute ischemic CVA - appreciate neurology consultation and recommendations - pt started on aspirin and plavix for 3 months followed by plavix monotherapy - US carotid studies: 50-69% stenosis of right carotid, left no hemodynamically significant stenosis - plan for 30 day cardiac event monitor - allowed permissive hypertension for first 24-48 hours - check A1c (4.9%)  and LDL (61) no statin started  - add statin for LDL >70 but her LDL was 61 so no statin ordered  - neuro checks and CVA protocol  - obtain STAT CT head if change in neuro exam  - SLP evaluation requested  - ambulatory referral to neurology placed for discharge  -TTE: LVEF 65-70%, indeterminate DD, carotids: 50-69% stenosis of right carotid, left no hemodynamically significant stenosis -sent communication to [facility]  office to request them to arrange a 30 day cardiac monitor (office closed on weekends).    Right carotid artery stenosis - moderately severe - 50-69% stenosis reported - given advanced age, unsure if she will be a candidate for surgery - referred to vascular surgery for further evaluation    Oral Thrush - fluconazole ordered to treat  - pt unable to tolerate nystatin swish due to dysphagia   Dysphagia post CVA - pt still having a difficult time per RN - aspiration precautions - Dysphagia 2 diet ordered crushed meds in puree recommended - outpatient SLP evaluation recommended [facility]    Large abdominal wall hernia - this is chronic - she was referred to outpatient surgery  - hernia is nontender and easily reducible  Essential hypertension  - allow permissive hypertension for 24-48 hours per neuro followed by gradual normotension - plan to start lowering BP slowly reintroducing home BP medications - resumed home amlodipine 10 mg, reduced home hydralazine to 25 mg TID, titrate up over next couple of days if needed; for now her  BPs have been well managed and optimally controlled      2nd degree heart block - pt was bradycardic in ED and was given atropine in ED - HR has been labile and some HRs in 30s and was given additional doses of atropine - initially monitored in stepdown ICU but moved to telemetry  - continuous telemetry monitoring  - will ask cardiology to see given recurrent administration of atropine - cardiology recommended holding donepezil and will follow up on 30 day cardiac monitor but no indication for pacer placement at this time    Stage 3b CKD  - stable from recent testing - follow  - renally dose medications as able    Dementia - resumed home medications   Acute delirium - secondary to acute CVA and hospitalization - delirium precautions ordered - safety sitter ordered - pt seems much less delirious today - plan to DC home to her regular familiar environment    HIV - resumed home antiretroviral   Discharge Diagnoses:  Principal Problem:   Acute stroke due to ischemia [facility]) Active Problems:   Wenckebach second degree AV block   Human immunodeficiency virus (HIV) disease (HCC)   Essential hypertension   CKD stage 3b, GFR 30-44 ml/min (HCC) - baseline SCr 1.3-1.6   Anemia of chronic disease   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   History of cervical cancer   Chronic venous insufficiency   Aortic atherosclerosis (HCC)   Dementia (HCC)   Confusion   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)   Aphasia   Dysphagia   Oral thrush   Discharge Instructions: Discharge Instructions     Ambulatory referral to Cardiology   Complete by: As directed    Ambulatory referral to Neurology   Complete by: As directed    An appointment is requested in approximately: 8-12 weeks   Ambulatory referral to Vascular Surgery   Complete by: As directed       Allergies as of 04/08/2023       Reactions   Clonidine Derivatives Other (See Comments)    Non-specific heart block        Medication List     STOP taking these medications    fexofenadine 60 MG tablet Commonly known as: ALLEGRA   Memantine HCl-Donepezil HCl 21-10 MG Cp24       TAKE these medications    acetaminophen 650 MG CR tablet Commonly known as: TYLENOL Take 650 mg by mouth in the morning, at noon, and at bedtime.   amLODipine 10 MG tablet Commonly known as: NORVASC Take 10 mg by mouth daily.   aspirin EC 81 MG tablet Take 1 tablet (81 mg total) by mouth daily. Swallow whole.   bictegravir-emtricitabine-tenofovir AF 50-200-25 MG Tabs tablet Commonly known as: BIKTARVY Take 1 tablet by mouth daily.   citalopram 20 MG tablet Commonly known as: CELEXA Take 20 mg by mouth daily.   clopidogrel 75 MG tablet Commonly known as: PLAVIX Take 1 tablet (75 mg total) by mouth daily. Start taking on: April 09, 2023   hydrALAZINE 25 MG tablet Commonly known as: APRESOLINE Take 1 tablet (25 mg total) by mouth every 8 (eight) hours.  What changed:  medication strength how much to take when to take this   IRON PO Take 45 mg by mouth daily.   memantine 21 MG Cp24 24 hr capsule Commonly known as: NAMENDA XR Take 1 capsule (21 mg total) by mouth at bedtime.   Vitamin D3 20 MCG (800 UNIT) Tabs Take 1 tablet by mouth daily.        Allergies  Allergen Reactions   Clonidine Derivatives Other (See Comments)    Non-specific heart block   Allergies as of 04/08/2023       Reactions   Clonidine Derivatives Other (See Comments)   Non-specific heart block        Medication List     STOP taking these medications    fexofenadine 60 MG tablet Commonly known as: ALLEGRA   Memantine HCl-Donepezil HCl 21-10 MG Cp24       TAKE these medications    acetaminophen 650 MG CR tablet Commonly known as: TYLENOL Take 650 mg by mouth in the morning, at noon, and at bedtime.   amLODipine 10 MG tablet Commonly known as: NORVASC Take 10 mg by mouth  daily.   aspirin EC 81 MG tablet Take 1 tablet (81 mg total) by mouth daily. Swallow whole.   bictegravir-emtricitabine-tenofovir AF 50-200-25 MG Tabs tablet Commonly known as: BIKTARVY Take 1 tablet by mouth daily.   citalopram 20 MG tablet Commonly known as: CELEXA Take 20 mg by mouth daily.   clopidogrel 75 MG tablet Commonly known as: PLAVIX Take 1 tablet (75 mg total) by mouth daily. Start taking on: April 09, 2023   hydrALAZINE 25 MG tablet Commonly known as: APRESOLINE Take 1 tablet (25 mg total) by mouth every 8 (eight) hours. What changed:  medication strength how much to take when to take this   IRON PO Take 45 mg by mouth daily.   memantine 21 MG Cp24 24 hr capsule Commonly known as: NAMENDA XR Take 1 capsule (21 mg total) by mouth at bedtime.   Vitamin D3 20 MCG (800 UNIT) Tabs Take 1 tablet by mouth daily.        Procedures/Studies: DG CHEST PORT 1 VIEW  Result Date: 04/06/2023 CLINICAL DATA:  Aspiration. EXAM: PORTABLE CHEST 1 VIEW COMPARISON:  Chest radiograph dated 09/26/2022. FINDINGS: The heart is enlarged. Vascular calcifications are seen in the aortic arch. Mild left basilar atelectasis/airspace disease. The right lung is clear. No pleural effusion or pneumothorax. Degenerative changes are seen in the spine. IMPRESSION: Mild left basilar atelectasis/airspace disease. Electronically Signed   By: Romona Curls M.D.   On: 04/06/2023 12:03   ECHOCARDIOGRAM COMPLETE BUBBLE STUDY  Result Date: 04/05/2023    ECHOCARDIOGRAM REPORT   Patient Name:   PHIL MACMILLAN Date of Exam: 04/05/2023 Medical Rec #:  [id number]        Height:       68.0 in Accession #:    [id number]       Weight:       118.4 lb Date of Birth:  [birth year]        BSA:          1.635 m Patient Age:    89 years         BP:           168/59 mmHg Patient Gender: F                HR:           63 bpm. Exam  [facility] Procedure: 2D Echo, Cardiac Doppler and Color Doppler  Indications:    Stroke 434.91 / I63.9  History:        Patient has prior history of Echocardiogram examinations. Risk                 Factors:Hypertension and Former Smoker.  Sonographer:    Celesta Gentile RCS Referring Phys: [id number] PRIYANKA O YADAV IMPRESSIONS  1. Left ventricular ejection fraction, by estimation, is 65 to 70%. The left ventricle has normal function. The left ventricle has no regional wall motion abnormalities. There is mild concentric left ventricular hypertrophy. Left ventricular diastolic parameters are indeterminate.  2. Right ventricular systolic function is normal. The right ventricular size is normal. There is mildly elevated pulmonary artery systolic pressure. The estimated right ventricular systolic pressure is 37.1 mmHg.  3. Left atrial size was severely dilated.  4. Right atrial size was moderately dilated.  5. The mitral valve is degenerative. Mild mitral valve regurgitation.  6. Tricuspid valve regurgitation is mild to moderate.  7. The aortic valve is tricuspid. There is mild calcification of the aortic valve. Aortic valve regurgitation is not visualized. Aortic valve sclerosis/calcification is present, without any evidence of aortic stenosis. Aortic valve mean gradient measures 8.0 mmHg. Dimentionless index 0.72.  8. The inferior vena cava is normal in size with greater than 50% respiratory variability, suggesting right atrial pressure of 3 mmHg.  9. Agitated saline contrast bubble study was negative, with no evidence of any interatrial shunt. Comparison(s): No prior Echocardiogram. FINDINGS  Left Ventricle: Left ventricular ejection fraction, by estimation, is 65 to 70%. The left ventricle has normal function. The left ventricle has no regional wall motion abnormalities. The left ventricular internal cavity size was normal in size. There is  mild concentric left ventricular hypertrophy. Left ventricular diastolic function could not be evaluated due to atrial fibrillation. Left  ventricular diastolic parameters are indeterminate. Right Ventricle: The right ventricular size is normal. No increase in right ventricular wall thickness. Right ventricular systolic function is normal. There is mildly elevated pulmonary artery systolic pressure. The tricuspid regurgitant velocity is 2.92  m/s, and with an assumed right atrial pressure of 3 mmHg, the estimated right ventricular systolic pressure is 37.1 mmHg. Left Atrium: Left atrial size was severely dilated. Right Atrium: Right atrial size was moderately dilated. Pericardium: Trivial pericardial effusion is present. The pericardial effusion is posterior to the left ventricle. Mitral Valve: The mitral valve is degenerative in appearance. Mild mitral valve regurgitation. Tricuspid Valve: The tricuspid valve is grossly normal. Tricuspid valve regurgitation is mild to moderate. Aortic Valve: The aortic valve is tricuspid. There is mild calcification of the aortic valve. There is mild aortic valve annular calcification. Aortic valve regurgitation is not visualized. Aortic valve sclerosis/calcification is present, without any evidence of aortic stenosis. Aortic valve mean gradient measures 8.0 mmHg. Aortic valve peak gradient measures 17.6 mmHg. Aortic valve area, by VTI measures 1.83 cm. Pulmonic Valve: The pulmonic valve was grossly normal. Pulmonic valve regurgitation is trivial. Aorta: The aortic root is normal in size and structure. Venous: The inferior vena cava is normal in size with greater than 50% respiratory variability, suggesting right atrial pressure of 3 mmHg. IAS/Shunts: No atrial level shunt detected by color flow Doppler. Agitated saline contrast was given intravenously to evaluate for intracardiac shunting. Agitated saline contrast bubble study was negative, with no evidence of any interatrial shunt.  LEFT VENTRICLE PLAX 2D LVIDd:  5.00 cm LVIDs:         3.00 cm LV PW:         1.40 cm LV IVS:        1.10 cm LVOT diam:     1.80  cm LV SV:         81 LV SV Index:   50 LVOT Area:     2.54 cm  RIGHT VENTRICLE TAPSE (M-mode): 2.8 cm LEFT ATRIUM              Index        RIGHT ATRIUM           Index LA diam:        4.60 cm  2.81 cm/m   RA Area:     23.20 cm LA Vol (A2C):   149.0 ml 91.11 ml/m  RA Volume:   74.00 ml  45.25 ml/m LA Vol (A4C):   153.0 ml 93.55 ml/m LA Biplane Vol: 152.0 ml 92.94 ml/m  AORTIC VALVE AV Area (Vmax):    1.81 cm AV Area (Vmean):   1.84 cm AV Area (VTI):     1.83 cm AV Vmax:           210.00 cm/s AV Vmean:          121.000 cm/s AV VTI:            0.444 m AV Peak Grad:      17.6 mmHg AV Mean Grad:      8.0 mmHg LVOT Vmax:         149.00 cm/s LVOT Vmean:        87.300 cm/s LVOT VTI:          0.319 m LVOT/AV VTI ratio: 0.72  AORTA Ao Root diam: 2.70 cm MITRAL VALVE                TRICUSPID VALVE MV Area (PHT): 4.26 cm     TR Peak grad:   34.1 mmHg MV Decel Time: 178 msec     TR Vmax:        292.00 cm/s MV E velocity: 145.50 cm/s MV A velocity: 62.40 cm/s   SHUNTS MV E/A ratio:  2.33         Systemic VTI:  0.32 m                             Systemic Diam: 1.80 cm Nona Dell MD Electronically signed by Nona Dell MD Signature Date/Time: 04/05/2023/4:19:33 PM    Final    US Carotid Bilateral  Result Date: 04/05/2023 CLINICAL DATA:  89 year old female with a history of stroke and left-sided facial droop EXAM: BILATERAL CAROTID DUPLEX ULTRASOUND TECHNIQUE: Wallace Cullens scale imaging, color Doppler and duplex ultrasound were performed of bilateral carotid and vertebral arteries in the neck. COMPARISON:  None Available. FINDINGS: Criteria: Quantification of carotid stenosis is based on velocity parameters that correlate the residual internal carotid diameter with NASCET-based stenosis levels, using the diameter of the distal internal carotid lumen as the denominator for stenosis measurement. The following velocity measurements were obtained: RIGHT ICA:  Systolic 174 cm/sec, Diastolic 43 cm/sec CCA:  108 cm/sec  SYSTOLIC ICA/CCA RATIO:  1.6 ECA:  92 cm/sec LEFT ICA:  Systolic 101 cm/sec, Diastolic 22 cm/sec CCA:  92 cm/sec SYSTOLIC ICA/CCA RATIO:  1.1 ECA:  103 cm/sec Right Brachial SBP: Not acquired Left Brachial SBP: Not acquired RIGHT CAROTID ARTERY: No significant calcifications  of the right common carotid artery. Intermediate waveform maintained. Moderate heterogeneous and partially calcified plaque at the right carotid bifurcation. Shadowing present. Low resistance waveform of the right ICA. Tortuosity RIGHT VERTEBRAL ARTERY: Antegrade flow with low resistance waveform. LEFT CAROTID ARTERY: No significant calcifications of the left common carotid artery. Intermediate waveform maintained. Moderate heterogeneous and partially calcified plaque at the left carotid bifurcation. Shadowing present. Low resistance waveform of the left ICA. Tortuosity LEFT VERTEBRAL ARTERY:  Antegrade flow with low resistance waveform. IMPRESSION: Right: Heterogeneous and partially calcified plaque at the right carotid bifurcation contributes to 50%-69% stenosis by established duplex criteria. Left: Color duplex indicates moderate heterogeneous and calcified plaque, with no hemodynamically significant stenosis by duplex criteria in the extracranial cerebrovascular circulation. Signed, Yvone Neu. Miachel Roux, RPVI [facility] Electronically Signed   By: Gilmer Mor D.O.   On: 04/05/2023 13:35   MR BRAIN WO CONTRAST  Result Date: 04/05/2023 CLINICAL DATA:  Aphasia. EXAM: MRI HEAD WITHOUT CONTRAST MRA HEAD WITHOUT CONTRAST TECHNIQUE: Multiplanar, multi-echo pulse sequences of the brain and surrounding structures were acquired without intravenous contrast. Angiographic images of the Circle of Willis were acquired using MRA technique without intravenous contrast. COMPARISON:  Head CT April 05, 2023. FINDINGS: MRI HEAD FINDINGS Brain: Area of restricted diffusion involving the  left insula and frontal operculum with corresponding faint T2 hyperintensity, consistent with acute infarct. No hemorrhage, hydrocephalus, mass or extra-axial collection. Scattered and confluent foci of T2 hyperintensity are seen within the white matter of the cerebral hemispheres nonspecific, likely related chronic small vessel ischemia. Moderate parenchymal volume loss. Vascular: The major flow voids at the skull base are maintained. Skull and upper cervical spine: Degenerative changes of the cervical spine with mild anterolisthesis of C2 over C3. No focal marrow lesion. Sinuses/Orbits: Chronic opacification of the right maxillary sinus. The orbits are maintained. Other: None. MRA HEAD FINDINGS Anterior circulation: Patent bilateral intracranial internal carotid arteries with severe stenosis at the paraclinoid segment of the right ICA and mild-to-moderate stenosis at the proximal petrous segment of the left ICA. Ectatic appearance of cavernous right ICA with a 3 mm laterally projecting outpouching suggestive of an small (extradural) aneurysm. The bilateral M1 segments are patent. Occlusion of a distal left M2/MCA branch to the area of acute stroke. Posterior circulation: The bilateral intracranial vertebral arteries and basilar artery are patent without evidence of high-grade stenosis. The right posterior cerebral artery has normal flow related enhancement. There is a high-grade stenosis at the distal left P2/PCA. Anatomic variants: None significant. IMPRESSION: 1. Acute infarct involving the left insula and left frontal operculum. 2. Occlusion of a distal left M2/MCA branch to the area of acute stroke. 3. Severe stenosis at the paraclinoid segment of the right ICA and mild-to-moderate stenosis at the proximal petrous segment of the left ICA. 4. High-grade stenosis at the distal left P2/PCA. 5. Ectatic appearance of cavernous right ICA with a 3 mm laterally projecting outpouching suggestive of an small  (extradural) aneurysm. These results were called by telephone at the time of interpretation on 04/05/2023 at 11:18 am to provider [facility] , who verbally acknowledged these results. Electronically Signed   By: Baldemar Lenis M.D.   On: 04/05/2023 11:20   MR ANGIO HEAD WO CONTRAST  Result Date: 04/05/2023 CLINICAL DATA:  Aphasia. EXAM: MRI HEAD WITHOUT CONTRAST MRA HEAD WITHOUT CONTRAST TECHNIQUE: Multiplanar, multi-echo pulse sequences of the brain and surrounding structures were acquired without intravenous contrast. Angiographic images of  the Circle of Willis were acquired using MRA technique without intravenous contrast. COMPARISON:  Head CT April 05, 2023. FINDINGS: MRI HEAD FINDINGS Brain: Area of restricted diffusion involving the left insula and frontal operculum with corresponding faint T2 hyperintensity, consistent with acute infarct. No hemorrhage, hydrocephalus, mass or extra-axial collection. Scattered and confluent foci of T2 hyperintensity are seen within the white matter of the cerebral hemispheres nonspecific, likely related chronic small vessel ischemia. Moderate parenchymal volume loss. Vascular: The major flow voids at the skull base are maintained. Skull and upper cervical spine: Degenerative changes of the cervical spine with mild anterolisthesis of C2 over C3. No focal marrow lesion. Sinuses/Orbits: Chronic opacification of the right maxillary sinus. The orbits are maintained. Other: None. MRA HEAD FINDINGS Anterior circulation: Patent bilateral intracranial internal carotid arteries with severe stenosis at the paraclinoid segment of the right ICA and mild-to-moderate stenosis at the proximal petrous segment of the left ICA. Ectatic appearance of cavernous right ICA with a 3 mm laterally projecting outpouching suggestive of an small (extradural) aneurysm. The bilateral M1 segments are patent. Occlusion of a distal left M2/MCA branch to the area of acute stroke.  Posterior circulation: The bilateral intracranial vertebral arteries and basilar artery are patent without evidence of high-grade stenosis. The right posterior cerebral artery has normal flow related enhancement. There is a high-grade stenosis at the distal left P2/PCA. Anatomic variants: None significant. IMPRESSION: 1. Acute infarct involving the left insula and left frontal operculum. 2. Occlusion of a distal left M2/MCA branch to the area of acute stroke. 3. Severe stenosis at the paraclinoid segment of the right ICA and mild-to-moderate stenosis at the proximal petrous segment of the left ICA. 4. High-grade stenosis at the distal left P2/PCA. 5. Ectatic appearance of cavernous right ICA with a 3 mm laterally projecting outpouching suggestive of an small (extradural) aneurysm. These results were called by telephone at the time of interpretation on 04/05/2023 at 11:18 am to provider [facility] , who verbally acknowledged these results. Electronically Signed   By: Baldemar Lenis M.D.   On: 04/05/2023 11:20   CT HEAD WO CONTRAST ( )  Result Date: 04/05/2023 CLINICAL DATA:  Aphasia and facial droop. EXAM: CT HEAD WITHOUT CONTRAST TECHNIQUE: Contiguous axial images were obtained from the base of the skull through the vertex without intravenous contrast. RADIATION DOSE REDUCTION: This exam was performed according to the departmental dose-optimization program which includes automated exposure control, adjustment of the mA and/or kV according to patient size and/or use of iterative reconstruction technique. COMPARISON:  Head CT 12/10/2022 and MRI 10/04/2022 FINDINGS: Brain: There is no evidence of an acute infarct, intracranial hemorrhage, mass, midline shift, or extra-axial fluid collection. Mild cerebral atrophy is within limits for age. Patchy to confluent hypodensities in the cerebral white matter are similar to the prior CT and are nonspecific but compatible with moderate chronic small  vessel ischemic disease. Vascular: Calcified atherosclerosis at the skull base. No hyperdense vessel. Skull: No acute fracture or suspicious osseous lesion. Sinuses/Orbits: Chronic right maxillary sinusitis with partially visualized material chronically extending into the nasal cavity. Clear mastoid air cells. Unremarkable included orbits. Other: None. IMPRESSION: 1. No evidence of acute intracranial abnormality. 2. Moderate chronic small vessel ischemic disease. Electronically Signed   By: Sebastian Ache M.D.   On: 04/05/2023 10:07     Subjective: Pt confused but eating better, vocalizing better.  No specific complaints.   Discharge Exam: Vitals:   04/08/23 0932 04/08/23 1401  BP: (!) 133/51 Marland Kitchen)  125/54  Pulse:    Resp: 20 18  Temp: 98.3 F (36.8 C) 98.2 F (36.8 C)  SpO2: 100% 100%   Vitals:   04/08/23 0506 04/08/23 0508 04/08/23 0932 04/08/23 1401  BP: (!) 147/52 (!) 147/52 (!) 133/51 (!) 125/54  Pulse:  99    Resp:   20 18  Temp:   98.3 F (36.8 C) 98.2 F (36.8 C)  TempSrc:   Oral Oral  SpO2:  100% 100% 100%  Weight:      Height:       General exam: Appears calm and comfortable. Pt pleasantly confused.    Respiratory system: Clear to auscultation. Respiratory effort normal. Cardiovascular system: normal S1 & S2 heard. No JVD, murmurs, rubs, gallops or clicks. No pedal edema. Gastrointestinal system: Abdomen is nondistended, soft and nontender. No organomegaly or masses felt. Normal bowel sounds heard. Central nervous system: Alert. Subtle right facial droop. Dysarthria. 4/5 strength in LUE LLE. FTN intact.   Extremities: Symmetric 5 x 5 power. Skin: No rashes, lesions or ulcers. Psychiatry: Judgement and insight appear diminished. Mood & affect anxious, tearful at times.   The results of significant diagnostics from this hospitalization (including imaging, microbiology, ancillary and laboratory) are listed below for reference.     Microbiology: Recent Results (from the  past 240 hour(s))  MRSA Next Gen by PCR, Nasal     Status: None   Collection Time: 04/05/23  1:47 PM   Specimen: Nasal Mucosa; Nasal Swab  Result Value Ref Range Status   MRSA by PCR Next Gen NOT DETECTED NOT DETECTED Final    Comment: (NOTE) The GeneXpert MRSA Assay (FDA approved for NASAL specimens only), is one component of a comprehensive MRSA colonization surveillance program. It is not intended to diagnose MRSA infection nor to guide or monitor treatment for MRSA infections. Test performance is not FDA approved in patients less than 9 years old. Performed at [facility], [address], [place], [place] [address]      Labs: BNP (last 3 results) No results for input(s): "BNP" in the last 8760 hours. Basic Metabolic Panel: Recent Labs  Lab 04/05/23 0840 04/05/23 0842  NA 136 139  K 4.6 4.7  CL 103 107  CO2 22  --   GLUCOSE 98 94  BUN 34* 31*  CREATININE 1.73* 1.90*  CALCIUM 10.3  --    Liver Function Tests: Recent Labs  Lab 04/05/23 0840  AST 31  ALT 20  ALKPHOS 69  BILITOT 0.7  PROT 8.0  ALBUMIN 4.1   No results for input(s): "LIPASE", "AMYLASE" in the last 168 hours. No results for input(s): "AMMONIA" in the last 168 hours. CBC: Recent Labs  Lab 04/05/23 0840 04/05/23 0842 04/06/23 0549  WBC 6.8  --  11.3*  NEUTROABS 5.2  --   --   HGB 8.6* 9.5* 8.4*  HCT 26.6* 28.0* 25.6*  MCV 111.3*  --  110.8*  PLT 351  --  330   Cardiac Enzymes: No results for input(s): "CKTOTAL", "CKMB", "CKMBINDEX", "TROPONINI" in the last 168 hours. BNP: Invalid input(s): "POCBNP" CBG: Recent Labs  Lab 04/05/23 0816  GLUCAP 92   D-Dimer No results for input(s): "DDIMER" in the last 72 hours. Hgb A1c No results for input(s): "HGBA1C" in the last 72 hours. Lipid Profile Recent Labs    04/06/23 0549  CHOL 147  HDL 75  LDLCALC 61  TRIG 55  CHOLHDL 2.0   Thyroid function studies No results for input(s): "TSH", "T4TOTAL", "  T3FREE", "THYROIDAB" in the last 72  hours.  Invalid input(s): "FREET3" Anemia work up No results for input(s): "VITAMINB12", "FOLATE", "FERRITIN", "TIBC", "IRON", "RETICCTPCT" in the last 72 hours. Urinalysis    Component Value Date/Time   COLORURINE YELLOW 04/05/2023 0951   APPEARANCEUR CLOUDY (A) 04/05/2023 0951   LABSPEC 1.009 04/05/2023 0951   PHURINE 8.0 04/05/2023 0951   GLUCOSEU NEGATIVE 04/05/2023 0951   GLUCOSEU NEG mg/dL 40/98/1191 4782   HGBUR NEGATIVE 04/05/2023 0951   BILIRUBINUR NEGATIVE 04/05/2023 0951   KETONESUR NEGATIVE 04/05/2023 0951   PROTEINUR 100 (A) 04/05/2023 0951   UROBILINOGEN 0.2 07/23/2013 1431   NITRITE NEGATIVE 04/05/2023 0951   LEUKOCYTESUR NEGATIVE 04/05/2023 0951   Sepsis Labs Recent Labs  Lab 04/05/23 0840 04/06/23 0549  WBC 6.8 11.3*   Microbiology Recent Results (from the past 240 hour(s))  MRSA Next Gen by PCR, Nasal     Status: None   Collection Time: 04/05/23  1:47 PM   Specimen: Nasal Mucosa; Nasal Swab  Result Value Ref Range Status   MRSA by PCR Next Gen NOT DETECTED NOT DETECTED Final    Comment: (NOTE) The GeneXpert MRSA Assay (FDA approved for NASAL specimens only), is one component of a comprehensive MRSA colonization surveillance program. It is not intended to diagnose MRSA infection nor to guide or monitor treatment for MRSA infections. Test performance is not FDA approved in patients less than 59 years old. Performed at [facility], [address], [place], [place] [address]    Time coordinating discharge: 38 mins   SIGNED:  Standley Dakins, MD  [facility] 04/08/2023, 2:16 PM How to contact the [facility] Attending or Consulting provider 7A - 7P or covering provider during after hours 7P -7A, for this patient?  Check the care team in [facility] and look for a) attending/consulting TRH provider listed and b) the [facility] team listed Log into [email or web address] and use [place]'s universal password to access. If you do not have the password, please contact the  hospital operator. Locate the [facility] provider you are looking for under [facility] and page to a number that you can be directly reached. If you still have difficulty reaching the provider, please page the [facility] (Director on Call) for the Hospitalists listed on amion for assistance.

## 2023-04-08 NOTE — TOC Transition Note (Signed)
Transition of Care [facility]) - CM/SW Discharge Note   Patient Details  Name: Karen Luna MRN: [id number] Date of Birth: 10-23-1931  [facility]) CM/SW Contact:  Leitha Bleak, RN Phone Number: 04/08/2023, 2:04 PM   Clinical Narrative:   Patient is medically ready for discharge. MD/TOC can not reach her son, Chrissie Noa. CM spoke with [facility].  She states that Chrissie Noa works until [employer] and she can not go home alone. CM spoke with Marchelle Folks at [facility], they can not transport her home until they talk with Chrissie Noa to see if they will be staying with her.  Chrissie Noa works and family thinks she needs more care in the home. PT reevaluated for TOC, recommendation continues to be HHPT.  Per Marchelle Folks the can Chrissie Noa can sign her into a nursing home, for medical management, in the past he has not been willing to do this. The close at 5PM, she will continue to try to get in touch with him to see if he will pick up her mother and stay with her until they can make a plan in the morning. CM updated RN. Unsure if anyone is come get the patient today.     Final next level of care: Home w Home Health Services Barriers to Discharge: Family Issues  Patient Goals and CMS Choice CMS Medicare.gov Compare Post Acute Care list provided to:: Patient Choice offered to / list presented to : Patient  Discharge Placement       Patient and family notified of of transfer: 04/08/23  Discharge Plan and Services Additional resources added to the After Visit Summary for         Social Determinants of Health (SDOH) Interventions SDOH Screenings   Food Insecurity: No Food Insecurity (04/05/2023)  Housing: Patient Unable To Answer (04/05/2023)  Transportation Needs: No Transportation Needs (04/05/2023)  Utilities: Not At Risk (04/05/2023)  Depression (PHQ2-9): Low Risk  (06/26/2022)  Financial Resource Strain: Low Risk  (09/27/2022)   Received from [facility], [facility]  Social Connections: Unknown (09/26/2022)    Received from [facility]  Tobacco Use: Medium Risk (04/08/2023)     Readmission Risk Interventions    04/07/2023    1:23 PM  Readmission Risk Prevention Plan  Transportation Screening Complete  PCP or Specialist Appt within 3-5 Days Complete  HRI or Home Care Consult Complete  Social Work Consult for Recovery Care Planning/Counseling Complete  Palliative Care Screening Not Applicable  Medication Review Oceanographer) Complete

## 2023-04-08 NOTE — Telephone Encounter (Signed)
-----   Message from [facility] sent at 04/06/2023 10:49 AM EST ----- Regarding: 30 day cardiac monitor request Hello Jake Seats,  I have a stroke patient in the hospital that will need a 30 day cardiac monitor as part of the stroke work up and I am requesting if you could send one to her.    Thank you for all of your assistance.   Standley Dakins MD  [facility]

## 2023-04-08 NOTE — TOC Progression Note (Signed)
Transition of Care [facility]) - Progression Note    Patient Details  Name: Karen Luna MRN: [id number] Date of Birth: July 18, 1931  Transition of [facility]) CM/SW Contact  Leitha Bleak, RN Phone Number: 04/08/2023, 12:49 PM  Clinical Narrative:   Charlesetta Shanks from [facility] called to get an update. We discussed HHPT, they will arrange home health. She states that the patient goes to the day center as well. They will follow up on therapy. She said usually Chrissie Noa will provide transportation if he is unavailable they will transport her home.     Expected Discharge Plan: Home w Home Health Services Barriers to Discharge: Continued Medical Work up  Expected Discharge Plan and Services    Social Determinants of Health (SDOH) Interventions SDOH Screenings   Food Insecurity: No Food Insecurity (04/05/2023)  Housing: Patient Unable To Answer (04/05/2023)  Transportation Needs: No Transportation Needs (04/05/2023)  Utilities: Not At Risk (04/05/2023)  Depression (PHQ2-9): Low Risk  (06/26/2022)  Financial Resource Strain: Low Risk  (09/27/2022)   Received [facility], [facility]  Social Connections: Unknown (09/26/2022)   Received from [facility]  Tobacco Use: Medium Risk (04/08/2023)    Readmission Risk Interventions    04/07/2023    1:23 PM  Readmission Risk Prevention Plan  Transportation Screening Complete  PCP or Specialist Appt within 3-5 Days Complete  HRI or Home Care Consult Complete  Social Work Consult for Recovery Care Planning/Counseling Complete  Palliative Care Screening Not Applicable  Medication Review Oceanographer) Complete

## 2023-04-26 ENCOUNTER — Ambulatory Visit (HOSPITAL_COMMUNITY)
Admission: RE | Admit: 2023-04-26 | Discharge: 2023-04-26 | Disposition: A | Discharge status: Home / Self Care | Payer: Medicare (Managed Care) | Source: Ambulatory Visit | Attending: Internal Medicine | Admitting: Internal Medicine

## 2023-04-26 ENCOUNTER — Other Ambulatory Visit (HOSPITAL_COMMUNITY): Payer: Self-pay | Admitting: Internal Medicine

## 2023-04-26 DIAGNOSIS — R0989 Other specified symptoms and signs involving the circulatory and respiratory systems: Secondary | ICD-10-CM

## 2023-05-03 ENCOUNTER — Ambulatory Visit (HOSPITAL_COMMUNITY)
Admission: RE | Admit: 2023-05-03 | Discharge: 2023-05-03 | Disposition: A | Discharge status: Home / Self Care | Payer: Medicare (Managed Care) | Source: Ambulatory Visit | Attending: Vascular Surgery | Admitting: Vascular Surgery

## 2023-05-03 ENCOUNTER — Ambulatory Visit (HOSPITAL_COMMUNITY)
Admission: RE | Admit: 2023-05-03 | Discharge: 2023-05-03 | Disposition: A | Discharge status: Home / Self Care | Payer: Medicare (Managed Care) | Source: Ambulatory Visit | Attending: *Deleted | Admitting: *Deleted

## 2023-05-03 ENCOUNTER — Other Ambulatory Visit (HOSPITAL_COMMUNITY): Payer: Self-pay | Admitting: *Deleted

## 2023-05-03 DIAGNOSIS — R1312 Dysphagia, oropharyngeal phase: Secondary | ICD-10-CM | POA: Insufficient documentation

## 2023-05-03 DIAGNOSIS — F0390 Unspecified dementia without behavioral disturbance: Secondary | ICD-10-CM | POA: Diagnosis not present

## 2023-05-03 DIAGNOSIS — K469 Unspecified abdominal hernia without obstruction or gangrene: Secondary | ICD-10-CM | POA: Insufficient documentation

## 2023-05-03 DIAGNOSIS — R1310 Dysphagia, unspecified: Secondary | ICD-10-CM | POA: Diagnosis present

## 2023-05-03 DIAGNOSIS — I10 Essential (primary) hypertension: Secondary | ICD-10-CM | POA: Diagnosis not present

## 2023-05-03 DIAGNOSIS — Z8673 Personal history of transient ischemic attack (TIA), and cerebral infarction without residual deficits: Secondary | ICD-10-CM | POA: Insufficient documentation

## 2023-05-03 NOTE — Progress Notes (Signed)
 Modified Barium Swallow Study  Patient Details  Name: Karen Luna MRN: [id number] Date of Birth: 12-09-1931  Today's Date: 05/03/2023  Modified Barium Swallow completed.  Full report located under Chart Review in the Imaging Section.  History of Present Illness 89 yr old seen for outpatient MBS with history of CVA, dementia, HTN, large abdominal wall hernia. Family reports coughing during/after meals, regurgitation, trouble swallowing pills, coughing up mucous/phlegm. Pt lives alone and has some assist from [facility] throughout the day. Famly reports having to do the Heimlich several days ago. Pt currently is on puree and nectar thick liquids.   Clinical Impression Pt exhibits mild oropharyngeal dysphagia with suspected significant esophageal involvement. Orally she was able to contain boluses and transit with min lingual residue which she typically swallowed independently to clear. Mildly delayed mastication of regular texture. There was decreased laryngeal elevation and glottic closure with penetration of thin liquid during sequential trials due to impulsivity, one trial with cup and at end of study pt appeared to prematurely inhale during the swallow. Smaller , controlled sips and cues to throat clear were effective in clearing penetrates. During trial with pill she had minimal silent aspiration of thin liquid. Her pharyngeal stripping wave appeared reduced and there was mild amount of thin that tried to ascend toward nasopharynx but did not enter. Minimal vallecular and pyriform sinus residue with thin that she cleared with subsequent swallow. Difficulty orally transiting pill with thin and when puree was given the pill had already entered the valleculae and was swallowed with puree. Scan of esophagus revealed pill stopped in proximal esophagus and significant column of barium seen from proximal to distal esophagus.Pill stopped at the GE junction and some of barium descended esophagus however  significant amount remained. This may account for some of the coughing behaviors and regurgitation that family witnesses. Recommend that pt have full supervision with all meals (due to cognitive impairments) of regular texture, thin liquids to facilitate the following strategies: small sips from cup, volitional throat clear/cough, alternate liquids and solids, crush meds and remain upright 45 min to an hour after meals. Recommended to family that pt may benefit from GI consult and they informed this SLP that pt has a GI appointment in February. Factors that may increase risk of adverse event in presence of aspiration Noe & Lianne 2021):    Swallow Evaluation Recommendations Recommendations: PO diet PO Diet Recommendation: Regular;Thin liquids (Level 0) Liquid Administration via: Cup;No straw Medication Administration: Crushed with puree Supervision: Full supervision/cueing for swallowing strategies;Patient able to self-feed Swallowing strategies  : Clear throat intermittently;Small bites/sips;Slow rate;Follow solids with liquids Postural changes: Stay upright 30-60 min after meals;Position pt fully upright for meals Recommended consults: Consider GI consultation      Dustin Olam Bull 05/03/2023,3:32 PM

## 2023-05-06 NOTE — Progress Notes (Signed)
 Patient name: Karen Luna MRN: [id number] DOB: 1931-06-14 Sex: female  REASON FOR CONSULT: CVA with carotid stenosis  HPI: Karen Luna is a 89 y.o. female, with history of CKD stage III, hypertension, HIV that presents for evaluation of CVA with carotid stenosis.  Patiently initially presented with aphasia on 04/05/2023 to [facility].  Ultimately her MRI on 04/05/2023 showed acute infarct in the left insula and left frontal operculum with occlusion of the distal left M2 MCA branch.  She did have evaluation by neurology and they felt likely embolic and recommended dual antiplatelet therapy (per the initial consult note).  She has also been seen by cardiology for bradycardia with plans for an event monitor.  Carotid ultrasound on 04/05/2023 showed 50 to 69% right ICA stenosis with no hemodynamically significant left ICA stenosis.  No further neurologic events since discharge.  Did get cleared by speech therapy today.  Still has ongoing aphasia.  Here with her daughter-in-law.  Past Medical History:  Diagnosis Date   Anemia of chronic disease 07/06/2011   Aortic atherosclerosis (HCC) 01/06/2015   Seen on CT scan, currently asymptomatic   Cancer (HCC)    uterine( per pt)   Chronic kidney disease, stage 3 (HCC) 09/01/2008   Chronic venous insufficiency 10/03/2012   Essential hypertension 09/19/2006   History of cervical cancer 08/11/2012   s/p hysterectomy, remote, specifics unknown    Human immunodeficiency virus (HIV) disease (HCC) 09/12/2006   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 07/25/2012   Low grade squamous intraepithelial lesion (LGSIL) on cervical Pap smear 08/06/2007   LGSIL: 08/06/2007, 11/05/2008, 05/11/2009 ASC-US : 05/27/2008, 08/31/2009, 05/24/2010, 05/23/2012 High Risk HPV: Detected    Onychomycosis of left great toe 09/09/2015   Overweight (BMI 25.0-29.9) 07/25/2012    Past Surgical History:  Procedure Laterality Date   ABDOMINAL HYSTERECTOMY      Family History   Problem Relation Age of Onset   Hypertension Mother    Heart disease Mother    Hypertension Father    Heart disease Father    Hypertension Sister    HIV/AIDS Daughter    Early death Sister        Gun shot wound   Cerebrovascular Accident Son     SOCIAL HISTORY: Social History   Socioeconomic History   Marital status: Widowed    Spouse name: Not on file   Number of children: Not on file   Years of education: Not on file   Highest education level: Not on file  Occupational History   Not on file  Tobacco Use   Smoking status: Former    Current packs/day: 0.00    Types: Cigarettes    Quit date: 08/06/1988    Years since quitting: 34.7   Smokeless tobacco: Never  Vaping Use   Vaping status: Never Used  Substance and Sexual Activity   Alcohol use: No    Alcohol/week: 0.0 standard drinks of alcohol   Drug use: No   Sexual activity: Not on file  Other Topics Concern   Not on file  Social History Narrative   Not on file   Social Drivers of Health   Financial Resource Strain: Low Risk  (09/27/2022)   [facility], [facility]   Overall Financial Resource Strain (CARDIA)    Difficulty of Paying Living Expenses: Not hard at all  Food Insecurity: No Food Insecurity (04/05/2023)   Hunger Vital Sign    Worried About Running Out of Food in  the Last Year: Never true    Ran Out of Food in the Last Year: Never true  Transportation Needs: No Transportation Needs (04/05/2023)   PRAPARE - Administrator, Civil Service (Medical): No    Lack of Transportation (Non-Medical): No  Physical Activity: Not on file  Stress: Not on file  Social Connections: Unknown (09/26/2022)   Received from [facility]   Social Network    Social Network: Not on file  Intimate Partner Violence: Not At Risk (04/05/2023)   Humiliation, Afraid, Rape, and Kick questionnaire    Fear of Current or Ex-Partner: No    Emotionally Abused: No    Physically Abused: No    Sexually  Abused: No    Allergies  Allergen Reactions   Clonidine  Derivatives Other (See Comments)    Non-specific heart block    Current Outpatient Medications  Medication Sig Dispense Refill   acetaminophen  (TYLENOL ) 650 MG CR tablet Take 650 mg by mouth in the morning, at noon, and at bedtime.     amLODipine  (NORVASC ) 10 MG tablet Take 10 mg by mouth daily.     aspirin  EC 81 MG tablet Take 1 tablet (81 mg total) by mouth daily. Swallow whole.     bictegravir-emtricitabine -tenofovir  AF (BIKTARVY ) 50-200-25 MG TABS tablet Take 1 tablet by mouth daily. 30 tablet 11   Cholecalciferol  (VITAMIN D3) 20 MCG (800 UNIT) TABS Take 1 tablet by mouth daily.     citalopram  (CELEXA ) 20 MG tablet Take 20 mg by mouth daily.      clopidogrel  (PLAVIX ) 75 MG tablet Take 1 tablet (75 mg total) by mouth daily. 90 tablet 1   Ferrous Sulfate  (IRON PO) Take 45 mg by mouth daily.     hydrALAZINE  (APRESOLINE ) 25 MG tablet Take 1 tablet (25 mg total) by mouth every 8 (eight) hours. 90 tablet 1   memantine  (NAMENDA  XR) 21 MG CP24 24 hr capsule Take 1 capsule (21 mg total) by mouth at bedtime. 30 capsule 1   No current facility-administered medications for this visit.    REVIEW OF SYSTEMS:  [X]  denotes positive finding, [ ]  denotes negative finding Cardiac  Comments:  Chest pain or chest pressure:    Shortness of breath upon exertion:    Short of breath when lying flat:    Irregular heart rhythm:        Vascular    Pain in calf, thigh, or hip brought on by ambulation:    Pain in feet at night that wakes you up from your sleep:     Blood clot in your veins:    Leg swelling:         Pulmonary    Oxygen  at home:    Productive cough:     Wheezing:         Neurologic    Sudden weakness in arms or legs:     Sudden numbness in arms or legs:     Sudden onset of difficulty speaking or slurred speech:    Temporary loss of vision in one eye:     Problems with dizziness:         Gastrointestinal    Blood in  stool:     Vomited blood:         Genitourinary    Burning when urinating:     Blood in urine:        Psychiatric    Major depression:         Hematologic  Bleeding problems:    Problems with blood clotting too easily:        Skin    Rashes or ulcers:        Constitutional    Fever or chills:      PHYSICAL EXAM: There were no vitals filed for this visit.  GENERAL: The patient is a well-nourished female, in no acute distress. The vital signs are documented above. CARDIAC: There is a regular rate and rhythm.  VASCULAR:  Bilateral radial pulses palpable PULMONARY: No respiratory distress ABDOMEN: Soft and non-tender. MUSCULOSKELETAL: There are no major deformities or cyanosis. NEUROLOGIC: No focal weakness or paresthesias are detected.  Cranial nerves II through XII grossly intact. SKIN: There are no ulcers or rashes noted. PSYCHIATRIC: The patient has a normal affect.  DATA:   Ultrasound carotid 04/05/2023 shows 50 to 69% stenosis of the right carotid artery at the bifurcation with no hemodynamically significant stenosis in the left carotid at the bifurcation.  Assessment/Plan:  89 y.o. female, with history of CKD stage III, hypertension, HIV that presents for evaluation of CVA with carotid stenosis.  Patiently initially presented with aphasia on 04/05/2023 to [facility].  Ultimately her MRI on 04/05/2023 showed acute infarct in the left insula and left frontal operculum with occlusion of the distal left M2 MCA branch.  I discussed with her and her daughter-in-law that I do not think her carotid artery is etiology for her stroke.  As noted the stroke was in her left brain and her carotid duplex shows no hemodynamically significant stenosis in the left carotid artery.  She does have a 50 to 69% stenosis on ultrasound by velocity but this is on the contralateral right side opposite of where her stroke was by MRI.  I discussed optimal medical therapy.  I will follow her with  another carotid duplex in 6 months for ongoing surveillance.  I discussed she let me know if she has any new symptoms that would change our plan of care.   Lonni DOROTHA Gaskins, MD [facility] Office: [phone]

## 2023-05-07 ENCOUNTER — Ambulatory Visit (INDEPENDENT_AMBULATORY_CARE_PROVIDER_SITE_OTHER): Payer: Medicare (Managed Care) | Admitting: Vascular Surgery

## 2023-05-07 ENCOUNTER — Encounter: Payer: Self-pay | Admitting: Vascular Surgery

## 2023-05-07 VITALS — BP 174/61 | HR 44 | Ht 68.0 in | Wt 119.0 lb

## 2023-05-07 DIAGNOSIS — I6523 Occlusion and stenosis of bilateral carotid arteries: Secondary | ICD-10-CM | POA: Diagnosis not present

## 2023-05-07 DIAGNOSIS — I6529 Occlusion and stenosis of unspecified carotid artery: Secondary | ICD-10-CM | POA: Insufficient documentation

## 2023-05-09 ENCOUNTER — Other Ambulatory Visit: Payer: Self-pay

## 2023-05-09 DIAGNOSIS — I6523 Occlusion and stenosis of bilateral carotid arteries: Secondary | ICD-10-CM

## 2023-06-21 ENCOUNTER — Encounter: Payer: Self-pay | Admitting: Gastroenterology

## 2023-06-21 ENCOUNTER — Ambulatory Visit: Payer: Medicare (Managed Care) | Admitting: Gastroenterology

## 2023-06-21 VITALS — BP 118/60 | HR 59 | Ht 66.54 in | Wt 123.4 lb

## 2023-06-21 DIAGNOSIS — Z7902 Long term (current) use of antithrombotics/antiplatelets: Secondary | ICD-10-CM | POA: Diagnosis not present

## 2023-06-21 DIAGNOSIS — F0390 Unspecified dementia without behavioral disturbance: Secondary | ICD-10-CM

## 2023-06-21 DIAGNOSIS — R1319 Other dysphagia: Secondary | ICD-10-CM | POA: Diagnosis not present

## 2023-06-21 DIAGNOSIS — Z8673 Personal history of transient ischemic attack (TIA), and cerebral infarction without residual deficits: Secondary | ICD-10-CM | POA: Diagnosis not present

## 2023-06-21 NOTE — Patient Instructions (Signed)
 Please call back if you decide to have Endoscopy procedure.  _______________________________________________________  If your blood pressure at your visit was 140/90 or greater, please contact your primary care physician to follow up on this.  _______________________________________________________  If you are age 88 or older, your body mass index should be between 23-30. Your Body mass index is 19.6 kg/m. If this is out of the aforementioned range listed, please consider follow up with your Primary Care Provider.  If you are age 88 or younger, your body mass index should be between 19-25. Your Body mass index is 19.6 kg/m. If this is out of the aformentioned range listed, please consider follow up with your Primary Care Provider.   ________________________________________________________  The [place] [facility] providers would like to encourage you to use [facility] to communicate with providers for non-urgent requests or questions.  Due to long hold times on the telephone, sending your provider a message by [facility] may be a faster and more efficient way to get a response.  Please allow 48 business hours for a response.  Please remember that this is for non-urgent requests.  _______________________________________________________    It was a pleasure to see you today!  Thank you for trusting me with your gastrointestinal care!    Scott E.Tomasa Rand, MD

## 2023-06-21 NOTE — Progress Notes (Signed)
 HPI : Karen Luna is a 89 y.o. female with a history of dementia, recent stroke, carotid artery stenosis CKD, anemia and hypertension who is referred to [place] [facility] [employer] [facility]for further evaluation of dysphagia. The patient is accompanied by her daughter-in-law who helps provide most of the history.  There appears to be no prior history of dysphagia for the patient's stroke in December of last year.  Since her stroke, she has struggled with eating and drinking.  She has episodes of choking and coughing with swallowing, as well as episodes of getting food stuck, requiring the Heimlich maneuver. She was evaluated by speech pathology last month and showed evidence of oropharyngeal dysphagia and suspected esophageal involvement as well.  She was cleared for thin liquids and solid food following that evaluation, but then it sounds like the patient experienced an aspiration event at her care home, prompting regression in her diet, currently with crushed pills and thickened liquids. The modified barium swallow report indicates that the barium tablet stopped in the proximal esophagus with a column of barium seen from proximal to distal esophagus.  The patient and her daughter-in-law both indicate that the swallowing seems to be improved somewhat over the past several weeks.  The patient has gained weight since her stroke, weighing 123 pounds today, up from 119 pounds 6 weeks ago. She denies any other chronic GI symptoms such as heartburn, acid regurgitation, abdominal pain, nausea/vomiting.   ----------------------------------------------------------------------------------------------  Modified Barium Swallow Clinical Impression: Clinical Impression: Pt exhibits mild oropharyngeal dysphagia with suspected significant esophageal involvement. Orally she was able to contain boluses and transit with min lingual residue which she typically swallowed independently to clear. Mildly delayed  mastication of regular texture. There was decreased laryngeal elevation and glottic closure with penetration of thin liquid during sequential trials due to impulsivity, one trial with cup and at end of study pt appeared to prematurely inhale during the swallow. Smaller , controlled sips and cues to throat clear were effective in clearing penetrates. During trial with pill she had minimal silent aspiration of thin liquid. Her pharyngeal stripping wave appeared reduced and there was mild amount of thin that tried to ascend toward nasopharynx but did not enter. Minimal vallecular and pyriform sinus residue with thin that she cleared with subsequent swallow. Difficulty orally transiting pill with thin and when puree was given the pill had already entered the valleculae and was swallowed with puree. Scan of esophagus revealed pill stopped in proximal esophagus and significant column of barium seen from proximal to distal esophagus.Pill stopped at the GE junction and some of barium descended esophagus however significant amount remained. This may account for some of the coughing behaviors and regurgitation that family witnesses. Recommend that pt have full supervision with all meals (due to cognitive impairments) of regular texture, thin liquids to facilitate the following strategies: small sips from cup, volitional throat clear/cough, alternate liquids and solids, crush meds and remain upright 45 min to an hour after meals. Recommended to family that pt may benefit from GI consult and they informed this SLP that pt has a GI appointment in February  Hospitalization December 6 through 9, 2024 Brief Hospitalization Summary: Please see all hospital notes, images, labs for full details of the hospitalization. Admission provider HPI:  89 y/o female with dementia, HTN, HIV on antiretrovirals, stage 3b CKD, chronic large abdominal wall hernia, anemia of chronic disease, LV hypertrophy, history of cervical and uterine  cancer, s/p hysterectomy, chronic venous insufficiency, aortic atherosclerosis  who was recently hospitalized at [facility] 11/25-11/26 for an episode of painless rectal bleeding and hyperkalemia with AKI. She recovered from that and the bleeding had stopped and she was discharged back home. She was brought in today by son from home with concern for CVA. She was last noted to be normal yesterday at 6pm. This morning she is aphasic with a left sided facial droop and weakness in her right lower extremity. Code stroke protocol in the ED and was seen by teleneurologist and MRI confimed acute infarct involving the left insula and left frontal operculum. Pt has new findings of dysphagia. Pt is being admitted for full stroke work up per neurology recommendations  Hospital Course by problem list    Acute ischemic CVA - appreciate neurology consultation and recommendations - pt started on aspirin and plavix for 3 months followed by plavix monotherapy - US carotid studies: 50-69% stenosis of right carotid, left no hemodynamically significant stenosis - plan for 30 day cardiac event monitor - allowed permissive hypertension for first 24-48 hours - check A1c (4.9%)  and LDL (61) no statin started  - add statin for LDL >70 but her LDL was 61 so no statin ordered  - neuro checks and CVA protocol  - obtain STAT CT head if change in neuro exam  - SLP evaluation requested  - ambulatory referral to neurology placed for discharge  -TTE: LVEF 65-70%, indeterminate DD, carotids: 50-69% stenosis of right carotid, left no hemodynamically significant stenosis -sent communication to [facility] [place] office to request them to arrange a 30 day cardiac monitor (office closed on weekends).    Right carotid artery stenosis - moderately severe - 50-69% stenosis reported - given advanced age, unsure if she will be a candidate for surgery - referred to vascular surgery for further evaluation    Oral Thrush - fluconazole  ordered to treat  - pt unable to tolerate nystatin swish due to dysphagia   Dysphagia post CVA - pt still having a difficult time per RN - aspiration precautions - Dysphagia 2 diet ordered crushed meds in puree recommended - outpatient SLP evaluation recommended [facility]    Large abdominal wall hernia - this is chronic - she was referred to outpatient surgery  - hernia is nontender and easily reducible   Essential hypertension  - allow permissive hypertension for 24-48 hours per neuro followed by gradual normotension - plan to start lowering BP slowly reintroducing home BP medications - resumed home amlodipine 10 mg, reduced home hydralazine to 25 mg TID, titrate up over next couple of days if needed; for now her BPs have been well managed and optimally controlled      2nd degree heart block - pt was bradycardic in ED and was given atropine in ED - HR has been labile and some HRs in 30s and was given additional doses of atropine - initially monitored in stepdown ICU but moved to telemetry  - continuous telemetry monitoring  - will ask cardiology to see given recurrent administration of atropine - cardiology recommended holding donepezil and will follow up on 30 day cardiac monitor but no indication for pacer placement at this time    Stage 3b CKD  - stable from recent testing - follow  - renally dose medications as able    Dementia - resumed home medications   Acute delirium - secondary to acute CVA and hospitalization - delirium precautions ordered - safety sitter ordered - pt seems much less delirious  today - plan to DC home to her regular familiar environment    HIV - resumed home antiretroviral  MRI brain April 05, 2023 IMPRESSION: 1. Acute infarct involving the left insula and left frontal operculum. 2. Occlusion of a distal left M2/MCA branch to the area of acute stroke. 3. Severe stenosis at the paraclinoid segment of the right ICA  and mild-to-moderate stenosis at the proximal petrous segment of the left ICA. 4. High-grade stenosis at the distal left P2/PCA. 5. Ectatic appearance of cavernous right ICA with a 3 mm laterally projecting outpouching suggestive of an small (extradural) aneurysm    Component Ref Range & Units (hover) 2 mo ago  Fecal Occult Bld NEGATIVE    Past Medical History:  Diagnosis Date   Anemia of chronic disease 07/06/2011   Aortic atherosclerosis (HCC) 01/06/2015   Seen on CT scan, currently asymptomatic   Cancer (HCC)    uterine( per pt)   Chronic kidney disease, stage 3 (HCC) 09/01/2008   Chronic venous insufficiency 10/03/2012   Essential hypertension 09/19/2006   History of cervical cancer 08/11/2012   s/p hysterectomy, remote, specifics unknown    Human immunodeficiency virus (HIV) disease (HCC) 09/12/2006   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 07/25/2012   Low grade squamous intraepithelial lesion (LGSIL) on cervical Pap smear 08/06/2007   LGSIL: 08/06/2007, 11/05/2008, 05/11/2009 ASC-US: 05/27/2008, 08/31/2009, 05/24/2010, 05/23/2012 High Risk HPV: Detected    Onychomycosis of left great toe 09/09/2015   Overweight (BMI 25.0-29.9) 07/25/2012     Past Surgical History:  Procedure Laterality Date   ABDOMINAL HYSTERECTOMY     Family History  Problem Relation Age of Onset   Hypertension Mother    Heart disease Mother    Hypertension Father    Heart disease Father    Hypertension Sister    HIV/AIDS Daughter    Early death Sister        Gun shot wound   Cerebrovascular Accident Son    Social History   Tobacco Use   Smoking status: Former    Current packs/day: 0.00    Types: Cigarettes    Quit date: 08/06/1988    Years since quitting: 34.8   Smokeless tobacco: Never  Vaping Use   Vaping status: Never Used  Substance Use Topics   Alcohol use: No    Alcohol/week: 0.0 standard drinks of alcohol   Drug use: No   Current Outpatient Medications  Medication Sig Dispense  Refill   acetaminophen (TYLENOL) 650 MG CR tablet Take 650 mg by mouth in the morning, at noon, and at bedtime.     amLODipine (NORVASC) 10 MG tablet Take 10 mg by mouth daily.     aspirin EC 81 MG tablet Take 1 tablet (81 mg total) by mouth daily. Swallow whole.     bictegravir-emtricitabine-tenofovir AF (BIKTARVY) 50-200-25 MG TABS tablet Take 1 tablet by mouth daily. 30 tablet 11   Cholecalciferol (VITAMIN D3) 20 MCG (800 UNIT) TABS Take 1 tablet by mouth daily.     citalopram (CELEXA) 20 MG tablet Take 20 mg by mouth daily.      clopidogrel (PLAVIX) 75 MG tablet Take 1 tablet (75 mg total) by mouth daily. 90 tablet 1   Ferrous Sulfate (IRON PO) Take 45 mg by mouth daily.     hydrALAZINE (APRESOLINE) 25 MG tablet Take 1 tablet (25 mg total) by mouth every 8 (eight) hours. 90 tablet 1   memantine (NAMENDA XR) 21 MG CP24 24 hr capsule Take 1  capsule (21 mg total) by mouth at bedtime. 30 capsule 1   No current facility-administered medications for this visit.   Allergies  Allergen Reactions   Clonidine Derivatives Other (See Comments)    Non-specific heart block     Review of Systems: All systems reviewed and negative except where noted in HPI.    No results found.  Physical Exam: BP 118/60 (BP Location: Left Arm, Patient Position: Sitting, Cuff Size: Normal)   Pulse (!) 59   Ht 5' 6.54" (1.69 m)   Wt 123 lb 6.4 oz (56 kg)   BMI 19.60 kg/m  Constitutional: Pleasant, thin, African-American female in no acute distress.  Accompanied by daughter-in-law. HEENT: Normocephalic and atraumatic. Conjunctivae are normal. No scleral icterus.  Upper dentures Neck supple.  Cardiovascular: Bradycardic, regular rhythm.  Systolic murmur Pulmonary/chest: Effort normal and breath sounds normal. No wheezing, rales or rhonchi. Abdominal: Soft, nondistended, nontender. Bowel sounds active throughout. There are no masses palpable. No hepatomegaly. Extremities: no edema Lymphadenopathy: No cervical  adenopathy noted. Neurological: Answers questions appropriately, but lets daughter-in-law mostly speak for her. Skin: Skin is warm and dry. No rashes noted. Psychiatric: Normal mood and affect. Behavior is normal.  CBC    Component Value Date/Time   WBC 11.3 (H) 04/06/2023 0549   RBC 2.31 (L) 04/06/2023 0549   HGB 8.4 (L) 04/06/2023 0549   HGB 11.3 02/25/2015 1146   HCT 25.6 (L) 04/06/2023 0549   HCT 33.7 (L) 02/25/2015 1146   PLT 330 04/06/2023 0549   PLT 406 (H) 02/25/2015 1146   MCV 110.8 (H) 04/06/2023 0549   MCV 96 02/25/2015 1146   MCH 36.4 (H) 04/06/2023 0549   MCHC 32.8 04/06/2023 0549   RDW 13.1 04/06/2023 0549   RDW 14.2 02/25/2015 1146   LYMPHSABS 0.8 04/05/2023 0840   LYMPHSABS 1.7 02/25/2015 1146   MONOABS 0.7 04/05/2023 0840   EOSABS 0.0 04/05/2023 0840   EOSABS 0.2 02/25/2015 1146   BASOSABS 0.0 04/05/2023 0840   BASOSABS 0.0 02/25/2015 1146    CMP     Component Value Date/Time   NA 139 04/05/2023 0842   NA 138 02/25/2015 1146   K 4.7 04/05/2023 0842   CL 107 04/05/2023 0842   CO2 22 04/05/2023 0840   GLUCOSE 94 04/05/2023 0842   BUN 31 (H) 04/05/2023 0842   BUN 16 02/25/2015 1146   CREATININE 1.90 (H) 04/05/2023 0842   CREATININE 1.48 (H) 06/08/2020 1356   CALCIUM 10.3 04/05/2023 0840   PROT 8.0 04/05/2023 0840   PROT 7.5 02/25/2015 1146   ALBUMIN 4.1 04/05/2023 0840   ALBUMIN 4.0 02/25/2015 1146   AST 31 04/05/2023 0840   ALT 20 04/05/2023 0840   ALKPHOS 69 04/05/2023 0840   BILITOT 0.7 04/05/2023 0840   BILITOT <0.2 02/25/2015 1146   GFRNONAA 28 (L) 04/05/2023 0840   GFRNONAA 32 (L) 06/08/2020 1356   GFRAA 37 (L) 06/08/2020 1356       Latest Ref Rng & Units 04/06/2023    5:49 AM 04/05/2023    8:42 AM 04/05/2023    8:40 AM  CBC EXTENDED  WBC 4.0 - 10.5 K/uL 11.3   6.8   RBC 3.87 - 5.11 MIL/uL 2.31   2.39   Hemoglobin 12.0 - 15.0 g/dL 8.4  9.5  8.6   HCT 16.1 - 46.0 % 25.6  28.0  26.6   Platelets 150 - 400 K/uL 330   351   NEUT# 1.7 -  7.7 K/uL   5.2  Lymph# 0.7 - 4.0 K/uL   0.8       ASSESSMENT AND PLAN:   89 year old female with history of dementia, recent stroke (December 2024), with with carotid artery stenosis, on dual antiplatelet therapy, CKD, chronic anemia with new onset dysphagia following her stroke.  Although a truly accurate history is difficult to obtain due to the patient's dementia, her symptoms do seem more oropharyngeal in etiology (coughing, choking after swallowing).  I was not able to elicit a clear history of esophageal dysphagia. Although her barium swallow study is suggestive of esophageal dysphagia, she also has oropharyngeal dysphagia noted. I am not aware of any causes of sudden onset esophageal dysphagia.  There was no history of dysphagia prior to the stroke.  Strokes are well-known causes of sudden pharyngeal dysphagia, which the patient has.  Etiologies of esophageal dysphagia that would cause obstruction of the barium tablet would include anatomic (benign peptic stricture, mass) as well as motility disorders (achalasia, GEJ outflow obstruction).  It is also possible that the patient may have a very large hiatal hernia causing kinking of the esophagus, although I would think this would be reported on the barium swallow. All of the above etiologies for esophageal dysphagia typically have a more indolent and progressive time course. We discussed how an upper endoscopy is certainly reasonable to evaluate the patient's dysphagia and abnormal barium swallow.  We also discussed how the patient is fairly high risk for sedation given her recent stroke and carotid artery stenosis.  Although I did offer an upper endoscopy, I cautioned that it may not help the patient, if there is not a focal stricture that is amenable to dilation.  Additionally, we would need to hold Plavix for at least 5 days in order to safely perform a dilation.  And as the patient's swallowing seems to be subjectively improving, and the  patient is not having any objective weight loss, it may be prudent to hold off on any sedated procedures that may pose risk to the patient.   We also discussed performing a dedicated esophagram, which would give a better report on the presence of esophageal stricture/mass, presence of a hiatal hernia and also give some fraction of esophageal dysmotility.  Performing a good barium esophagram typically requires thin barium and a barium tablet, both of which are not included in the patient's recommended diet. The patient and the daughter-in-law prefer to discuss the matter further with the patient's son before agreeing to proceed with a procedure for now. - Hold off on EGD for now. - Discuss risks and benefits of the procedure further with family. - Consider repeating barium swallow with dedicated esophagram to evaluate esophageal anatomy and motility if speech pathology feels this is safe.  Stroke Stroke in December 2024 with residual dysphagia. On aspirin and Plavix for stroke prevention. Recent stroke increases sedation risk during procedures. - Continue aspirin and Plavix as prescribed.  Dementia Dementia complicates symptom and pain assessment. She does not typically complain of discomfort and recall likely not accurate, making severity assessment challenging. - Monitor for behavioral changes or new symptoms indicating discomfort or condition worsening.  Follow-up - Discuss upper endoscopy decision with her son. - If family decides to proceed with EGD, schedule at hospital due to higher risk status.  If proceeding with EGD, would need neurology clearance to hold Plavix.     Iyannah Blake E. Tomasa Rand, MD [facility]  I spent a total of 45 minutes reviewing the patient's medical record, interviewing and examining the  patient, discussing her diagnosis and management of her condition going forward, and documenting in the medical record   [facility], [address] [facility]*

## 2023-06-26 ENCOUNTER — Other Ambulatory Visit: Payer: Self-pay

## 2023-06-26 ENCOUNTER — Ambulatory Visit: Payer: Medicare (Managed Care) | Admitting: Internal Medicine

## 2023-06-26 ENCOUNTER — Other Ambulatory Visit: Payer: Medicare (Managed Care)

## 2023-06-26 DIAGNOSIS — B20 Human immunodeficiency virus [HIV] disease: Secondary | ICD-10-CM

## 2023-06-26 DIAGNOSIS — Z113 Encounter for screening for infections with a predominantly sexual mode of transmission: Secondary | ICD-10-CM

## 2023-06-27 ENCOUNTER — Ambulatory Visit: Payer: Medicare (Managed Care) | Admitting: Internal Medicine

## 2023-06-28 LAB — COMPREHENSIVE METABOLIC PANEL
AG Ratio: 1.1 (calc) (ref 1.0–2.5)
ALT: 11 U/L (ref 6–29)
AST: 15 U/L (ref 10–35)
Albumin: 4.2 g/dL (ref 3.6–5.1)
Alkaline phosphatase (APISO): 85 U/L (ref 37–153)
BUN/Creatinine Ratio: 19 (calc) (ref 6–22)
BUN: 28 mg/dL — ABNORMAL HIGH (ref 7–25)
CO2: 27 mmol/L (ref 20–32)
Calcium: 10.1 mg/dL (ref 8.6–10.4)
Chloride: 106 mmol/L (ref 98–110)
Creat: 1.47 mg/dL — ABNORMAL HIGH (ref 0.60–0.95)
Globulin: 3.7 g/dL (ref 1.9–3.7)
Glucose, Bld: 96 mg/dL (ref 65–99)
Potassium: 4.8 mmol/L (ref 3.5–5.3)
Sodium: 141 mmol/L (ref 135–146)
Total Bilirubin: 0.4 mg/dL (ref 0.2–1.2)
Total Protein: 7.9 g/dL (ref 6.1–8.1)

## 2023-06-28 LAB — T-HELPER CELLS (CD4) COUNT (NOT AT ARMC)
CD4 % Helper T Cell: 28 % — ABNORMAL LOW (ref 33–65)
CD4 T Cell Abs: 419 /uL (ref 400–1790)

## 2023-06-28 LAB — HIV-1 RNA QUANT-NO REFLEX-BLD
HIV 1 RNA Quant: NOT DETECTED {copies}/mL
HIV-1 RNA Quant, Log: NOT DETECTED {Log_copies}/mL

## 2023-06-28 LAB — CBC WITH DIFFERENTIAL/PLATELET
Absolute Lymphocytes: 1685 {cells}/uL (ref 850–3900)
Absolute Monocytes: 550 {cells}/uL (ref 200–950)
Basophils Absolute: 48 {cells}/uL (ref 0–200)
Basophils Relative: 1.1 %
Eosinophils Absolute: 79 {cells}/uL (ref 15–500)
Eosinophils Relative: 1.8 %
HCT: 28.6 % — ABNORMAL LOW (ref 35.0–45.0)
Hemoglobin: 9.4 g/dL — ABNORMAL LOW (ref 11.7–15.5)
MCH: 33.9 pg — ABNORMAL HIGH (ref 27.0–33.0)
MCHC: 32.9 g/dL (ref 32.0–36.0)
MCV: 103.2 fL — ABNORMAL HIGH (ref 80.0–100.0)
MPV: 9.4 fL (ref 7.5–12.5)
Monocytes Relative: 12.5 %
Neutro Abs: 2037 {cells}/uL (ref 1500–7800)
Neutrophils Relative %: 46.3 %
Platelets: 412 10*3/uL — ABNORMAL HIGH (ref 140–400)
RBC: 2.77 10*6/uL — ABNORMAL LOW (ref 3.80–5.10)
RDW: 12.1 % (ref 11.0–15.0)
Total Lymphocyte: 38.3 %
WBC: 4.4 10*3/uL (ref 3.8–10.8)

## 2023-07-03 ENCOUNTER — Other Ambulatory Visit: Payer: Self-pay

## 2023-07-03 ENCOUNTER — Telehealth: Payer: Medicare (Managed Care) | Admitting: Internal Medicine

## 2023-07-24 ENCOUNTER — Institutional Professional Consult (permissible substitution): Payer: Medicare (Managed Care) | Admitting: Emergency Medicine

## 2023-07-25 ENCOUNTER — Ambulatory Visit: Payer: Medicare (Managed Care) | Admitting: Internal Medicine

## 2023-07-25 ENCOUNTER — Other Ambulatory Visit: Payer: Self-pay

## 2023-07-25 ENCOUNTER — Encounter: Payer: Self-pay | Admitting: Internal Medicine

## 2023-07-25 VITALS — BP 179/72 | HR 42 | Temp 98.2°F | Resp 16 | Wt 123.2 lb

## 2023-07-25 DIAGNOSIS — I455 Other specified heart block: Secondary | ICD-10-CM | POA: Diagnosis not present

## 2023-07-25 DIAGNOSIS — B20 Human immunodeficiency virus [HIV] disease: Secondary | ICD-10-CM | POA: Diagnosis present

## 2023-07-25 DIAGNOSIS — N189 Chronic kidney disease, unspecified: Secondary | ICD-10-CM

## 2023-07-25 DIAGNOSIS — N179 Acute kidney failure, unspecified: Secondary | ICD-10-CM | POA: Diagnosis not present

## 2023-07-25 DIAGNOSIS — I459 Conduction disorder, unspecified: Secondary | ICD-10-CM

## 2023-07-25 MED ORDER — BICTEGRAVIR-EMTRICITAB-TENOFOV 50-200-25 MG PO TABS: 1.0000 | ORAL_TABLET | Freq: Every day | ORAL | 11 refills | Status: AC

## 2023-07-25 NOTE — Assessment & Plan Note (Signed)
 She does have a low pulse and has follow-up with cardiology arranged.  She is complaining of no dizziness or symptoms.

## 2023-07-25 NOTE — Assessment & Plan Note (Signed)
 She continues to do well on Biktarvy and no changes indicated.  Labs reviewed with her and good viral suppression.  No changes indicated and she will return in about 11 months

## 2023-07-25 NOTE — Assessment & Plan Note (Signed)
 Creatinine is a bit improved from previous and this was reviewed with her.

## 2023-07-25 NOTE — Progress Notes (Signed)
   Subjective:    Patient ID: Karen Luna, female    DOB: 1931/08/13, 89 y.o.   MRN: [id number]  HPI Ms Mamula is here for follow-up of HIV She is continued on Biktarvy and has had no problems getting or taking the medication.  No missed doses.  [facility] by [facility].  She has recently been hospitalized for an embolic stroke.  She otherwise is keeping active and able to walk and get around.  No complaints today.   Review of Systems  Constitutional:  Negative for fatigue.  Gastrointestinal:  Negative for diarrhea.  Skin:  Negative for rash.       Objective:   Physical Exam Eyes:     General: No scleral icterus. Pulmonary:     Effort: Pulmonary effort is normal.  Neurological:     Mental Status: She is alert.   SH: no tobacco        Assessment & Plan:

## 2023-08-01 NOTE — Progress Notes (Unsigned)
 Cardiology Office Note:   Date:  08/02/2023  ID:  CADIE SORCI, DOB [birth year], MRN [id number] PCP: [facility] Providers Cardiologist:  None {  History of Present Illness:   Karen Luna is a 89 y.o. female with a history of  HTN, chronic renal insufficiency, Alzheimer's dementia on memantine and donepezil.   In Dec 2024 she was found down by her neighbors.   She was found to have imaging evidence of acute embolic CVA and was started on DAPT per neurology recommendations. Carotid Doppler ultrasound was performed and showed R ICA 50 to 69% stenosis.  She had bradycardia with heart rates in the 30s and she did require atropine.  She was at [facility] and was seen on consultation by our service.  Echo was unremarkable.   She was supposed to use an event monitor ordered at the time of discharge.     She is now living in an assisted living facility.  Seems like she does well there.  She is here with her attentive caregiver who says she does pretty well.  She might look a little winded occasionally.  She walks down to the dining hall.  There is not been any apparent syncope or presyncope.  She denies any symptoms.  She has no chest pressure, neck or arm discomfort.  She does not feel palpitations.  ROS: As stated in the HPI and negative for all other systems.  Studies Reviewed:    EKG:   EKG Interpretation Date/Time:  Friday August 02 2023 13:35:03 EDT Ventricular Rate:  45 PR Interval:    QRS Duration:  84 QT Interval:  474 QTC Calculation: 410 R Axis:   77  Text Interpretation: Sinus bradycardia with Mobitz I (Wenckebach) block Left ventricular hypertrophy Nonspecific ST abnormality When compared with ECG of 08-Apr-2023 09:46, with complete heart block is not evident currently Confirmed by Rollene Rotunda ([id number]) on 08/02/2023 1:43:41 PM     Risk Assessment/Calculations:         Physical Exam:   VS:  BP (!) 164/78 (BP Location: Right  Arm, Patient Position: Sitting, Cuff Size: Normal)   Pulse 62   Wt 122 lb 3.2 oz (55.4 kg)   SpO2 97%   BMI 19.41 kg/m    Wt Readings from Last 3 Encounters:  08/02/23 122 lb 3.2 oz (55.4 kg)  07/25/23 123 lb 3.2 oz (55.9 kg)  06/21/23 123 lb 6.4 oz (56 kg)     GEN: Well nourished, well developed in no acute distress NECK: No JVD; No carotid bruits CARDIAC: RRR, 2 out of 6 apical systolic murmur radiating at the aortic outflow tract, no diastolic murmurs, rubs, gallops RESPIRATORY:  Clear to auscultation without rales, wheezing or rhonchi  ABDOMEN: Soft, non-tender, non-distended EXTREMITIES:  No edema; No deformity   ASSESSMENT AND PLAN:   Embolic CVA: I am going to have her wear a 4-week monitor.  The etiology of this was not clear.  Does not seem like she has significant residual.    Sinus bradycardia:  She had Mobitz type 1 and 2:1 AV block.  Her previous EKG looks like it could have been high degree heart block.  I am going to apply the 4-week monitor as above.  Carotid artery disease: We will follow with a Doppler in 1 year.  HTN: Her blood pressure is not well-controlled.  I will change her hydralazine to 3 times daily.  Follow up with me in [facility]  in 6 months.  Signed, Rollene Rotunda, MD

## 2023-08-02 ENCOUNTER — Ambulatory Visit: Payer: Medicare (Managed Care) | Attending: Vascular Surgery | Admitting: Cardiology

## 2023-08-02 ENCOUNTER — Telehealth: Payer: Self-pay

## 2023-08-02 ENCOUNTER — Encounter: Payer: Self-pay | Admitting: Cardiology

## 2023-08-02 VITALS — BP 164/78 | HR 62 | Wt 122.2 lb

## 2023-08-02 DIAGNOSIS — I6310 Cerebral infarction due to embolism of unspecified precerebral artery: Secondary | ICD-10-CM | POA: Diagnosis present

## 2023-08-02 DIAGNOSIS — I10 Essential (primary) hypertension: Secondary | ICD-10-CM | POA: Insufficient documentation

## 2023-08-02 DIAGNOSIS — R001 Bradycardia, unspecified: Secondary | ICD-10-CM | POA: Diagnosis not present

## 2023-08-02 DIAGNOSIS — R55 Syncope and collapse: Secondary | ICD-10-CM | POA: Diagnosis present

## 2023-08-02 MED ORDER — HYDRALAZINE HCL 50 MG PO TABS: 50.0000 mg | ORAL_TABLET | Freq: Three times a day (TID) | ORAL | 3 refills | Status: AC

## 2023-08-02 NOTE — Telephone Encounter (Signed)
 Called and spoke with daughter who states patient is in a facility called [facility]. Called them at [phone] and spoke with staff who states they will be bringing the patient to her appointment today.

## 2023-08-02 NOTE — Telephone Encounter (Signed)
 Dr.Hochrein saw patient this afternoon.Patient left without physician orders.Orders faxed to [facility] at fax # [phone] attention Shanda Bumps.

## 2023-08-02 NOTE — Patient Instructions (Signed)
 Medication Instructions:  Increase hydralazine 50 mg to three times per day.  *If you need a refill on your cardiac medications before your next appointment, please call your pharmacy*  Testing/Procedures: [employer] Cardiac Event Monitor Instructions  Your physician has requested you wear your cardiac event monitor for __30___ days, (1-30). [employer] may call or text to confirm a shipping address. The monitor will be sent to a land address via UPS. [employer] will not ship a monitor to a PO BOX. It typically takes 3-5 days to receive your monitor after it has been enrolled. [employer] will assist with USPS tracking if your package is delayed. The telephone number for [employer] is [phone]. Once you have received your monitor, please review the enclosed instructions. Instruction tutorials can also be viewed under help and settings on the enclosed cell phone. Your monitor has already been registered assigning a specific monitor serial # to you.  Billing and Self Pay Discount Information  [employer] has been provided the insurance information we had on file for you.  If your insurance has been updated, please call [employer] at [phone] to provide them with your updated insurance information.   [employer] offers a discounted Self Pay option for patients who have insurance that does not cover their cardiac event monitor or patients without insurance.  The discounted cost of a Self Pay Cardiac Event Monitor would be $225.00 , if the patient contacts [employer] at [phone] within 7 days of applying the monitor to make payment arrangements.  If the patient does not contact [employer] within 7 days of applying the monitor, the cost of the cardiac event monitor will be $350.00.  Applying the monitor  Remove cell phone from case and turn it on. The cell phone works as IT consultant and needs to be within [employer] of you at all times. The cell phone will need to be charged on a daily  basis. We recommend you plug the cell phone into the enclosed charger at your bedside table every night.  Monitor batteries: You will receive two monitor batteries labelled #1 and #2. These are your recorders. Plug battery #2 onto the second connection on the enclosed charger. Keep one battery on the charger at all times. This will keep the monitor battery deactivated. It will also keep it fully charged for when you need to switch your monitor batteries. A small light will be blinking on the battery emblem when it is charging. The light on the battery emblem will remain on when the battery is fully charged.  Open package of a Monitor strip. Insert battery #1 into black hood on strip and gently squeeze monitor battery onto connection as indicated in instruction booklet. Set aside while preparing skin.  Choose location for your strip, vertical or horizontal, as indicated in the instruction booklet. Shave to remove all hair from location. There cannot be any lotions, oils, powders, or colognes on skin where monitor is to be applied. Wipe skin clean with enclosed Saline wipe. Dry skin completely.  Peel paper labeled #1 off the back of the Monitor strip exposing the adhesive. Place the monitor on the chest in the vertical or horizontal position shown in the instruction booklet. One arrow on the monitor strip must be pointing upward. Carefully remove paper labeled #2, attaching remainder of strip to your skin. Try not to create any folds or wrinkles in the strip as you apply it.  Firmly press and release the circle in the center of the monitor battery. You will hear a  small beep. This is turning the monitor battery on. The heart emblem on the monitor battery will light up every 5 seconds if the monitor battery in turned on and connected to the patient securely. Do not push and hold the circle down as this turns the monitor battery off. The cell phone will locate the monitor battery. A screen will  appear on the cell phone checking the connection of your monitor strip. This may read poor connection initially but change to good connection within the next minute. Once your monitor accepts the connection you will hear a series of 3 beeps followed by a climbing crescendo of beeps. A screen will appear on the cell phone showing the two monitor strip placement options. Touch the picture that demonstrates where you applied the monitor strip.  Your monitor strip and battery are waterproof. You are able to shower, bathe, or swim with the monitor on. They just ask you do not submerge deeper than 3 feet underwater. We recommend removing the monitor if you are swimming in a lake, river, or ocean.  Your monitor battery will need to be switched to a fully charged monitor battery approximately once a week. The cell phone will alert you of an action which needs to be made.  On the cell phone, tap for details to reveal connection status, monitor battery status, and cell phone battery status. The green dots indicates your monitor is in good status. A red dot indicates there is something that needs your attention.  To record a symptom, click the circle on the monitor battery. In 30-60 seconds a list of symptoms will appear on the cell phone. Select your symptom and tap save. Your monitor will record a sustained or significant arrhythmia regardless of you clicking the button. Some patients do not feel the heart rhythm irregularities. [employer] will notify us of any serious or critical events.  Refer to instruction booklet for instructions on switching batteries, changing strips, the Do not disturb or Pause features, or any additional questions.  Call [employer] at [phone], to confirm your monitor is transmitting and record your baseline. They will answer any questions you may have regarding the monitor instructions at that time.  Returning the monitor to [employer]  Place all equipment back  into blue box. Peel off strip of paper to expose adhesive and close box securely. There is a prepaid UPS shipping label on this box. Drop in a UPS drop box, or at a UPS facility like [employer]. You may also contact [employer] to arrange UPS to pick up monitor package at your home.    Follow-Up: At [facility], you and your health needs are our priority.  As part of our continuing mission to provide you with exceptional heart care, our providers are all part of one team.  This team includes your primary Cardiologist (physician) and Advanced Practice Providers or APPs (Physician Assistants and Nurse Practitioners) who all work together to provide you with the care you need, when you need it.  Your next appointment:   6 month(s) In [place]  Provider:   Rollene Rotunda, MD    We recommend signing up for the patient portal called "MyChart".  Sign up information is provided on this After Visit Summary.  MyChart is used to connect with patients for Virtual Visits (Telemedicine).  Patients are able to view lab/test results, encounter notes, upcoming appointments, etc.  Non-urgent messages can be sent to your provider as well.   To learn more about what you can  do with MyChart, go to [email or web address].   Other Instructions       1st Floor: - Lobby - Registration  - Pharmacy  - Lab - Cafe  2nd Floor: - PV Lab - Diagnostic Testing (echo, CT, nuclear med)  3rd Floor: - Vacant  4th Floor: - TCTS (cardiothoracic surgery) - AFib Clinic - Structural Heart Clinic - Vascular Surgery  - Vascular Ultrasound  5th Floor: - HeartCare Cardiology (general and EP) - Clinical Pharmacy for coumadin, hypertension, lipid, weight-loss medications, and med management appointments    Valet parking services will be available as well.

## 2023-08-05 ENCOUNTER — Other Ambulatory Visit: Payer: Self-pay | Admitting: Cardiology

## 2023-08-05 DIAGNOSIS — I441 Atrioventricular block, second degree: Secondary | ICD-10-CM

## 2023-08-05 DIAGNOSIS — I6310 Cerebral infarction due to embolism of unspecified precerebral artery: Secondary | ICD-10-CM

## 2023-08-05 DIAGNOSIS — R001 Bradycardia, unspecified: Secondary | ICD-10-CM

## 2023-08-05 DIAGNOSIS — I10 Essential (primary) hypertension: Secondary | ICD-10-CM

## 2023-08-05 DIAGNOSIS — R55 Syncope and collapse: Secondary | ICD-10-CM

## 2023-08-12 ENCOUNTER — Encounter: Payer: Self-pay | Admitting: *Deleted

## 2023-08-12 ENCOUNTER — Telehealth: Payer: Self-pay | Admitting: *Deleted

## 2023-08-12 NOTE — Telephone Encounter (Signed)
 Unable to reach [facility] to discuss patients cardiac event monitor. Patient enrolled for [employer] to ship a 30 day cardiac event monitor to their facility. Letter with instructions mailed to facility.  Dr. Lavonne Prairie to read.

## 2023-08-15 ENCOUNTER — Encounter: Payer: Self-pay | Admitting: Neurology

## 2023-08-15 ENCOUNTER — Ambulatory Visit (INDEPENDENT_AMBULATORY_CARE_PROVIDER_SITE_OTHER): Payer: Medicare (Managed Care) | Admitting: Neurology

## 2023-08-15 VITALS — BP 170/68 | HR 54 | Ht 65.5 in | Wt 127.0 lb

## 2023-08-15 DIAGNOSIS — I639 Cerebral infarction, unspecified: Secondary | ICD-10-CM

## 2023-08-15 DIAGNOSIS — I6602 Occlusion and stenosis of left middle cerebral artery: Secondary | ICD-10-CM | POA: Diagnosis not present

## 2023-08-15 DIAGNOSIS — F0280 Dementia in other diseases classified elsewhere without behavioral disturbance: Secondary | ICD-10-CM | POA: Diagnosis not present

## 2023-08-15 DIAGNOSIS — G308 Other Alzheimer's disease: Secondary | ICD-10-CM

## 2023-08-15 NOTE — Progress Notes (Signed)
 [facility] [address] [place]. [place] [address] [phone]       OFFICE CONSULT NOTE  Ms. Karen Luna Date of Birth:  May 03, 1931 Medical Record Number:  [id number]   Referring MD: Lindie Spruce Reason for Referral: Stroke  HPI: Karen Luna is a 89 year old pleasant African-American lady seen today for office consultation visit for stroke.  She is a clinic today by her caregiver from assisted living facility where she lives.  History is obtained from the patient and review of electronic medical records.  I personally reviewed pertinent available imaging films in PACS.  She has past medical history of hypertension, chronic kidney disease stage III, HIV, congestive heart failure, anxiety, anemia and peripheral vascular disease.  She presented to [facility] on 04/05/2023 with sudden onset of inability to speak.  She presented to an outside time window for thrombolysis.  On teleneurology evaluation today she had NIH stroke scale of 2 for aphasia.  CT head showed no acute abnormalities but MRI scan showed patchy left MCA branch infarct.  MR angiogram of the brain showed left M2 distal occlusion.  Carotid ultrasound showed 50 to 69% right ICA but no significant left ICA stenosis.  Echocardiogram showed ejection fraction of 65 to 70%.  Left atrial size was normal.  Cardiac monitoring during hospitalization showed no evidence of A-fib.  Patient was placed on dual antiplatelet therapy aspirin Plavix for 3 weeks followed by aspirin alone.  She is currently at [facility] assisted living facility and may have done.  She states her speech has improved.  She is able to communicate normally.  She does have poor memory and secondary to baseline dementia.  She has good days and bad days.  There is no family available at the present time to discuss her care.  Patient is able to ambulate independently but she requires close supervision and is given her medications.  Her behavior is  calm and she does not get agitated or violent.  She does not have delusions or hallucinations.  She does do some mentally challenging activities like solving crossword puzzles on a regular basis.  There is no documented history of atrial fibrillation cardiac arrhythmias though she has history of congestive heart failure.   ROS:   14 system review of systems is positive for speech difficulties, memory loss all other systems negative  PMH:  Past Medical History:  Diagnosis Date   Alzheimer disease (HCC)    Anemia of chronic disease 07/06/2011   Anxiety    Aortic atherosclerosis (HCC) 01/06/2015   Seen on CT scan, currently asymptomatic   CHF (congestive heart failure) (HCC)    Chronic kidney disease, stage 3 (HCC) 09/01/2008   Chronic venous insufficiency 10/03/2012   Depression    Essential hypertension 09/19/2006   History of cervical cancer 08/11/2012   s/p hysterectomy, remote, specifics unknown    Human immunodeficiency virus (HIV) disease (HCC) 09/12/2006   Hypertensive retinopathy    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 07/25/2012   Low grade squamous intraepithelial lesion (LGSIL) on cervical Pap smear 08/06/2007   LGSIL: 08/06/2007, 11/05/2008, 05/11/2009 ASC-US: 05/27/2008, 08/31/2009, 05/24/2010, 05/23/2012 High Risk HPV: Detected    Macular degeneration    Onychomycosis of left great toe 09/09/2015   Overweight (BMI 25.0-29.9) 07/25/2012   Pulmonary nodule    PVD (peripheral vascular disease) (HCC)    Uterine cancer (HCC)    uterine( per pt)   Vascular dementia (HCC)    Vitamin D deficiency  Social History:  Social History   Socioeconomic History   Marital status: Widowed    Spouse name: Not on file   Number of children: 4   Years of education: Not on file   Highest education level: Not on file  Occupational History   Not on file  Tobacco Use   Smoking status: Former    Current packs/day: 0.00    Types: Cigarettes    Quit date: 08/06/1988     Years since quitting: 35.0   Smokeless tobacco: Never  Vaping Use   Vaping status: Never Used  Substance and Sexual Activity   Alcohol use: No    Alcohol/week: 0.0 standard drinks of alcohol   Drug use: No   Sexual activity: Not on file  Other Topics Concern   Not on file  Social History Narrative   Lives at [facility] [place] [facility]   Social Drivers of Health   Financial Resource Strain: Low Risk  (09/27/2022)   Received from [facility], [facility]   Overall Financial Resource Strain (CARDIA)    Difficulty of Paying Living Expenses: Not hard at all  Food Insecurity: No Food Insecurity (04/05/2023)   Hunger Vital Sign    Worried About Running Out of Food in the Last Year: Never true    Ran Out of Food in the Last Year: Never true  Transportation Needs: No Transportation Needs (04/05/2023)   PRAPARE - Administrator, Civil Service (Medical): No    Lack of Transportation (Non-Medical): No  Physical Activity: Not on file  Stress: Not on file  Social Connections: Unknown (09/26/2022)   Received from [facility]   Social Network    Social Network: Not on file  Intimate Partner Violence: Not At Risk (04/05/2023)   Humiliation, Afraid, Rape, and Kick questionnaire    Fear of Current or Ex-Partner: No    Emotionally Abused: No    Physically Abused: No    Sexually Abused: No    Medications:   Current Outpatient Medications on File Prior to Visit  Medication Sig Dispense Refill   acetaminophen (TYLENOL) 650 MG CR tablet Take 650 mg by mouth 3 (three) times daily.     aspirin EC 81 MG tablet Take 81 mg by mouth daily. Swallow whole.     bictegravir-emtricitabine-tenofovir AF (BIKTARVY) 50-200-25 MG TABS tablet Take 1 tablet by mouth daily. 30 tablet 11   cholecalciferol (VITAMIN D3) 25 MCG (1000 UNIT) tablet Take 1,000 Units by mouth daily.     citalopram (CELEXA) 20 MG tablet Take 20 mg by mouth daily.      clopidogrel (PLAVIX) 75 MG tablet Take 1 tablet (75  mg total) by mouth daily. 90 tablet 1   Ferrous Sulfate Dried (SLOW IRON PO) Take 45 mg by mouth daily.     hydrALAZINE (APRESOLINE) 50 MG tablet Take 1 tablet (50 mg total) by mouth 3 (three) times daily. 270 tablet 3   losartan (COZAAR) 25 MG tablet Take 25 mg by mouth daily.     memantine (NAMENDA XR) 21 MG CP24 24 hr capsule Take 21 mg by mouth daily.     metolazone (ZAROXOLYN) 2.5 MG tablet Take 2.5 mg by mouth daily.     Multiple Vitamins-Minerals (PRESERVISION AREDS 2 PO) Take 1 tablet by mouth daily.     No current facility-administered medications on file prior to visit.    Allergies:   Allergies  Allergen Reactions   Clonidine Derivatives Other (See Comments)  Non-specific heart block    Physical Exam General: well developed, well nourished pleasant elderly African-American lady, seated, in no evident distress Head: head normocephalic and atraumatic.   Neck: supple with no carotid or supraclavicular bruits Cardiovascular: regular rate and rhythm, no murmurs Musculoskeletal: no deformity Skin:  no rash/petichiae Vascular:  Normal pulses all extremities  Neurologic Exam Mental Status: Awake and fully alert. Oriented to place and time. Recent and remote memory intact. Attention span, concentration and fund of knowledge diminished. Mood and affect appropriate.  Slightly nonfluent speech with no paraphasic errors.  Good comprehension Cranial Nerves: Fundoscopic exam reveals sharp disc margins. Pupils equal, briskly reactive to light. Extraocular movements full without nystagmus. Visual fields full to confrontation. Hearing intact. Facial sensation intact.  Mild right lower facial asymmetry.  Tongue, palate moves normally and symmetrically.  Motor: Normal bulk and tone. Normal strength in all tested extremity muscles. Sensory.: intact to touch , pinprick , position and vibratory sensation.  Coordination: Rapid alternating movements normal in all extremities. Finger-to-nose and  heel-to-shin performed accurately bilaterally. Gait and Station: Arises from chair without difficulty. Stance is normal. Gait demonstrates normal stride length and balance . Able to heel, toe and tandem walk without difficulty.  Reflexes: 1+ and symmetric. Toes downgoing.   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  Patient has recovered her speech well but does have significant baseline dementia     PLAN:I had a long discussion with the patient and her caregiver regarding her recent embolic stroke and discussed results of evaluation and treatment and prevention and answered questions.  I recommend she continue Plavix for stroke prevention for now and maintain aggressive risk factor modification with strict control of hypertension with blood pressure goal below 130/90, lipids with LDL cholesterol goal below 70 mg percent and diabetes with hemoglobin A1c goal below 6.5%.  Check 30-day heart monitor for paroxysmal A-fib.  Continue Namenda for her dementia and increase participation in cognitively challenging activities like solving crossword puzzles, playing bridge and sudoku.  Return for follow-up in the future in 6 months or call earlier if necessary.  No family available for discussion. Greater than 50% time during this 45 visit was spent in counseling and coordination of care and discussion about her stroke and answering questions Karen Beaver, MD Note: This document was prepared with digital dictation and possible smart phrase technology. Any transcriptional errors that result from this process are unintentional.

## 2023-08-15 NOTE — Patient Instructions (Signed)
 I had a long discussion with the patient and her caregiver regarding her recent embolic stroke and discussed results of evaluation and treatment and prevention and answered questions.  I recommend she continue Plavix for stroke prevention for now and maintain aggressive risk factor modification with strict control of hypertension with blood pressure goal below 130/90, lipids with LDL cholesterol goal below 70 mg percent and diabetes with hemoglobin A1c goal below 6.5%.  Check 30-day heart monitor for paroxysmal A-fib.  Continue Namenda for her dementia and increase participation in cognitively challenging activities like solving crossword puzzles, playing bridge and sudoku.  Return for follow-up in the future in 6 months or call earlier if necessary.  Stroke Prevention Some medical conditions and behaviors can lead to a higher chance of having a stroke. You can help prevent a stroke by eating healthy, exercising, not smoking, and managing any medical conditions you have. Stroke is a leading cause of functional impairment. Primary prevention is particularly important because a majority of strokes are first-time events. Stroke changes the lives of not only those who experience a stroke but also their family and other caregivers. How can this condition affect me? A stroke is a medical emergency and should be treated right away. A stroke can lead to brain damage and can sometimes be life-threatening. If a person gets medical treatment right away, there is a better chance of surviving and recovering from a stroke. What can increase my risk? The following medical conditions may increase your risk of a stroke: Cardiovascular disease. High blood pressure (hypertension). Diabetes. High cholesterol. Sickle cell disease. Blood clotting disorders (hypercoagulable state). Obesity. Sleep disorders (obstructive sleep apnea). Other risk factors include: Being older than age 82. Having a history of blood clots,  stroke, or mini-stroke (transient ischemic attack, TIA). Genetic factors, such as race, ethnicity, or a family history of stroke. Smoking cigarettes or using other tobacco products. Taking birth control pills, especially if you also use tobacco. Heavy use of alcohol or drugs, especially cocaine and methamphetamine. Physical inactivity. What actions can I take to prevent this? Manage your health conditions High cholesterol levels. Eating a healthy diet is important for preventing high cholesterol. If cholesterol cannot be managed through diet alone, you may need to take medicines. Take any prescribed medicines to control your cholesterol as told by your health care provider. Hypertension. To reduce your risk of stroke, try to keep your blood pressure below 130/80. Eating a healthy diet and exercising regularly are important for controlling blood pressure. If these steps are not enough to manage your blood pressure, you may need to take medicines. Take any prescribed medicines to control hypertension as told by your health care provider. Ask your health care provider if you should monitor your blood pressure at home. Have your blood pressure checked every year, even if your blood pressure is normal. Blood pressure increases with age and some medical conditions. Diabetes. Eating a healthy diet and exercising regularly are important parts of managing your blood sugar (glucose). If your blood sugar cannot be managed through diet and exercise, you may need to take medicines. Take any prescribed medicines to control your diabetes as told by your health care provider. Get evaluated for obstructive sleep apnea. Talk to your health care provider about getting a sleep evaluation if you snore a lot or have excessive sleepiness. Make sure that any other medical conditions you have, such as atrial fibrillation or atherosclerosis, are managed. Nutrition Follow instructions from your health care provider  about what  to eat or drink to help manage your health condition. These instructions may include: Reducing your daily calorie intake. Limiting how much salt (sodium) you use to 1,500 milligrams (mg) each day. Using only healthy fats for cooking, such as olive oil, canola oil, or sunflower oil. Eating healthy foods. You can do this by: Choosing foods that are high in fiber, such as whole grains, and fresh fruits and vegetables. Eating at least 5 servings of fruits and vegetables a day. Try to fill one-half of your plate with fruits and vegetables at each meal. Choosing lean protein foods, such as lean cuts of meat, poultry without skin, fish, tofu, beans, and nuts. Eating low-fat dairy products. Avoiding foods that are high in sodium. This can help lower blood pressure. Avoiding foods that have saturated fat, trans fat, and cholesterol. This can help prevent high cholesterol. Avoiding processed and prepared foods. Counting your daily carbohydrate intake.  Lifestyle If you drink alcohol: Limit how much you have to: 0-1 drink a day for women who are not pregnant. 0-2 drinks a day for men. Know how much alcohol is in your drink. In the [place] one drink equals one 12 oz bottle of beer ( ), one 5 oz glass of wine ( ), or one 1 oz glass of hard liquor (44mL). Do not use any products that contain nicotine or tobacco. These products include cigarettes, chewing tobacco, and vaping devices, such as e-cigarettes. If you need help quitting, ask your health care provider. Avoid secondhand smoke. Do not use drugs. Activity  Try to stay at a healthy weight. Get at least 30 minutes of exercise on most days, such as: Fast walking. Biking. Swimming. Medicines Take over-the-counter and prescription medicines only as told by your health care provider. Aspirin or blood thinners (antiplatelets or anticoagulants) may be recommended to reduce your risk of forming blood clots that can lead to  stroke. Avoid taking birth control pills. Talk to your health care provider about the risks of taking birth control pills if: You are over 63 years old. You smoke. You get very bad headaches. You have had a blood clot. Where to find more information American Stroke Association: www.strokeassociation.org Get help right away if: You or a loved one has any symptoms of a stroke. "BE FAST" is an easy way to remember the main warning signs of a stroke: B - Balance. Signs are dizziness, sudden trouble walking, or loss of balance. E - Eyes. Signs are trouble seeing or a sudden change in vision. F - Face. Signs are sudden weakness or numbness of the face, or the face or eyelid drooping on one side. A - Arms. Signs are weakness or numbness in an arm. This happens suddenly and usually on one side of the body. S - Speech. Signs are sudden trouble speaking, slurred speech, or trouble understanding what people say. T - Time. Time to call emergency services. Write down what time symptoms started. You or a loved one has other signs of a stroke, such as: A sudden, severe headache with no known cause. Nausea or vomiting. Seizure. These symptoms may represent a serious problem that is an emergency. Do not wait to see if the symptoms will go away. Get medical help right away. Call your local emergency services (911 in the [place] Do not drive yourself to the hospital. Summary You can help to prevent a stroke by eating healthy, exercising, not smoking, limiting alcohol intake, and managing any medical conditions you may have. Do not use any products  that contain nicotine or tobacco. These include cigarettes, chewing tobacco, and vaping devices, such as e-cigarettes. If you need help quitting, ask your health care provider. Remember "BE FAST" for warning signs of a stroke. Get help right away if you or a loved one has any of these signs. This information is not intended to replace advice given to you by your  health care provider. Make sure you discuss any questions you have with your health care provider. Document Revised: 03/19/2022 Document Reviewed: 03/19/2022 Elsevier Patient Education  2024 [employer].

## 2023-08-18 ENCOUNTER — Telehealth: Payer: Self-pay | Admitting: Cardiology

## 2023-08-18 NOTE — Telephone Encounter (Signed)
 Outpatient service line:  Monitor company had called reporting that patient is in Mobitz 1 heart block with rates as low as 35 during the night and sometimes during the day but stays in this frequently.  There have been no triggered events.  Attempted to call patient, no answer.  Will route to MD if they want to change parameters as we will continue to get notified with heart rates less than 40.

## 2023-08-19 ENCOUNTER — Telehealth: Payer: Self-pay

## 2023-08-19 ENCOUNTER — Telehealth: Payer: Self-pay | Admitting: *Deleted

## 2023-08-19 NOTE — Telephone Encounter (Signed)
 Caller Mylinda Asa) is reporting critical results.

## 2023-08-19 NOTE — Telephone Encounter (Signed)
 Error. See previous encounter regarding monitors.

## 2023-08-19 NOTE — Telephone Encounter (Addendum)
   Cardiac Monitor Alert  Date of alert:  08/19/2023   Patient Name: Karen Luna  DOB: May 24, 1931  MRN: [id number]   [facility] Cardiologist: Eilleen Grates, MD  [facility] EP:  None    Monitor Information:  [facility] MCOT (Mobile Cardiac Outpatient Telemetry) BioTelemetry  Reason:  Syncope and Collapse (R/O Paroxysmal A-fbi; Hx Stroke) Ordering provider:  Dr. Lavonne Prairie  :1}  Alert #1  New Onset of Atrial Flutter (autotriggered alert)  08/18/23 at 9:53am  HR 109 bpm for longer than 1 minute.  This is the 1st alert for this rhythm.  The patient has no hx of Atrial Fibrillation/Flutter.  The patient is not currently anticoagulated.    Alert #2 Severe Bradycardia(autotriggered alert)  08/19/23 at 2:36am  2nd degree AV block type 1 This is the 4th alert for this rhythm.  The patient has no hx of Atrial Fibrillation/Flutter.  The patient is not currently anticoagulated.        Next Cardiology Appointment    Patient has 6 mo f/u due for 01/29/2024 Provider:  Eilleen Grates, MD   The patient was contacted today.  She is asymptomatic. Patient contacted today by another triage nurse this morning. Monitors reports are before patient was contacted. No new reports since last contacted.   Encounter forwarded to Primary Cardiologist for review/recommendations.   Other: - No documented plan of care in previous OV note regarding A-Flutter. - Patient not anticoagulated.  - Patient recently seen in office on 08/02/23/  [name], RN  08/19/2023 2:31 PM

## 2023-08-19 NOTE — Telephone Encounter (Signed)
   Cardiac Monitor Alert  Date of alert:  08/19/2023   Patient Name: Karen Luna  DOB: [birth year]  MRN: [id number]   [facility] Cardiologist: Eilleen Grates, MD  [facility] EP:  None    Monitor Information:  [facility] MCOT (Mobile Cardiac Outpatient Telemetry) BioTelemetry  Reason:  Syncope and Collapse (R/O Paroxysmal A-fbi; Hx Stroke) Ordering provider:  Dr. Lavonne Prairie { Alert 2nd degree AV Block, Type I on 08/17/23 at 22:02, then on 08/18/23 at 01:41, then on 08/18/23 at 23:55. This is the 2nd, 3rd, and 4th alert for this rhythm.   Next Cardiology Appointment   Date:  has 6 mo f/u due for 01/29/2024 Provider:  Eilleen Grates, MD  The patient was contacted today (spoke to Caregiver).  Patient is at a memory care, Assisted Living Facility; at [facility] [place]; spoke to [place] there who states pt has no complains at all.  During the Alert times the patient was in the bed sleeping, per Thersia Flax.  The pt stays in her room mostly, only walking to the lounge are for meals 2-3 times per day. She is asymptomatic. (This is per Thersia Flax)  Arrhythmia, symptoms and history reviewed with Dr. Alvis Ba (DOD).   Plan:  Continue current plan; no changes to medications.      Clancy Crimes [place], [place]  08/19/2023 10:12 AM

## 2023-08-20 ENCOUNTER — Telehealth: Payer: Self-pay

## 2023-08-20 NOTE — Telephone Encounter (Signed)
   Cardiac Monitor Alert  Date of alert:  08/20/2023   Patient Name: Karen Luna  DOB: Dec 20, 1931  MRN: [id number]   [facility] Cardiologist: Eilleen Grates, MD  [facility] EP:  None    Monitor Information: Cardiac Event Monitor [[employer]]  Reason:  Cryptogenic Stroke, Syncope and Collapse, Bradycardia, AVB,Mobitz Type 1, Wenckebach Ordering provider:  Dr. Lavonne Prairie   Alert 2nd degree AV Block, Type I Bradycardia - slowest HR: 38   at 1:47 am This is the  6  alert for this rhythm.   Received call from [facility] regarding 3:53 an alert  Alert 2nd degree AV Block, Type I Bradycardia - slowest HR: 35   at 3:53 AM This is the 5th alert for this rhythm.   Next Cardiology Appointment   Date:  N/A  Provider:  N/A  LOV: 08/02/23 with Dr. Lavonne Prairie   The patient could NOT be reached by telephone today.  Unable to leave message Arrhythmia, symptoms and history reviewed with Dr. Audery Blazing DOD.   Plan:  No changes, have Dr. Lavonne Prairie review/follow up    Other:   Hilton Lucky, RN  08/20/2023 8:43 AM

## 2023-08-21 ENCOUNTER — Encounter: Payer: Self-pay | Admitting: Cardiology

## 2023-08-21 ENCOUNTER — Ambulatory Visit: Payer: Medicare (Managed Care) | Admitting: Emergency Medicine

## 2023-08-21 ENCOUNTER — Encounter: Payer: Self-pay | Admitting: Emergency Medicine

## 2023-08-21 VITALS — BP 138/62 | HR 52 | Ht 65.5 in | Wt 122.0 lb

## 2023-08-21 DIAGNOSIS — I441 Atrioventricular block, second degree: Secondary | ICD-10-CM | POA: Diagnosis not present

## 2023-08-21 DIAGNOSIS — R0602 Shortness of breath: Secondary | ICD-10-CM | POA: Diagnosis not present

## 2023-08-21 DIAGNOSIS — R0600 Dyspnea, unspecified: Secondary | ICD-10-CM | POA: Diagnosis not present

## 2023-08-21 DIAGNOSIS — Z87891 Personal history of nicotine dependence: Secondary | ICD-10-CM

## 2023-08-21 DIAGNOSIS — R911 Solitary pulmonary nodule: Secondary | ICD-10-CM | POA: Insufficient documentation

## 2023-08-21 NOTE — Patient Instructions (Signed)
 VISIT SUMMARY:  Today, you were seen for shortness of breath, bradycardia, and a possible pulmonary nodule. We discussed your symptoms and reviewed your medical history, including your past stroke and heart condition. We also talked about the findings from your previous chest x-rays and made a plan for further monitoring and treatment.  YOUR PLAN:  -SHORTNESS OF BREATH: Shortness of breath, especially during walking, can be due to various reasons including lung or heart issues. We will start a trial of an albuterol inhaler to see if it helps improve your breathing during activities.  -BRADYCARDIA WITH TYPE 1 SECOND-DEGREE AV BLOCK: Bradycardia means a slow heart rate, and type 1 second-degree AV block is a specific type of heart rhythm issue. We discussed the possibility of a pacemaker, but we will wait for further recommendations from your cardiologist before making any decisions.  -POSSIBLE PULMONARY NODULE: A pulmonary nodule is a small growth in the lung that can be benign or malignant. Your previous chest x-rays did not show a nodule, but we will do another chest x-ray in June to make sure there are no changes. We will also review your past medical records for any additional information.  INSTRUCTIONS:  Please follow up with a chest x-ray in June to monitor for any changes in your lungs. Additionally, we will review your cardiology notes to determine if a pacemaker is recommended for your bradycardia. Use the albuterol inhaler as directed to see if it helps with your shortness of breath during activities.

## 2023-08-21 NOTE — Progress Notes (Signed)
 Subjective:    Patient ID: Karen Luna, female    DOB: 10-14-1931, 89 y.o.   MRN: [id number]  HPI Karen Luna is a 89 year old female with a history of embolic stroke and bradycardia who presents with possible pulmonary nodule. She was referred by [facility] for evaluation of a possible pulmonary nodule seen on chest x-ray.  She experiences shortness of breath primarily during ambulation, such as walking from her room to the dining room, necessitating frequent pauses to catch her breath. No shortness of breath occurs while sitting still. She is not currently using any inhaler medications and has never used them in the past. She quit smoking over 30 years ago.  She has a history of an embolic left MCA branch stroke in December 2024, with an unclear etiology. During her hospitalization for the stroke, chest x-rays on December 7 and April 26, 2023, showed mild left basilar atelectasis and raised concerns for possible aspiration pneumonitis, but no evidence of a pulmonary nodule was noted.  She has a history of bradycardia characterized as type one second degree AV block. She is currently being monitored for slow heart rate, which could also contribute to her shortness of breath.   Review of Systems As per HPI  Past Medical History:  Diagnosis Date   Alzheimer disease (HCC)    Anemia of chronic disease 07/06/2011   Anxiety    Aortic atherosclerosis (HCC) 01/06/2015   Seen on CT scan, currently asymptomatic   CHF (congestive heart failure) (HCC)    Chronic kidney disease, stage 3 (HCC) 09/01/2008   Chronic venous insufficiency 10/03/2012   Depression    Essential hypertension 09/19/2006   History of cervical cancer 08/11/2012   s/p hysterectomy, remote, specifics unknown    Human immunodeficiency virus (HIV) disease (HCC) 09/12/2006   Hypertensive retinopathy    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 07/25/2012   Low grade squamous intraepithelial  lesion (LGSIL) on cervical Pap smear 08/06/2007   LGSIL: 08/06/2007, 11/05/2008, 05/11/2009 ASC-US : 05/27/2008, 08/31/2009, 05/24/2010, 05/23/2012 High Risk HPV: Detected    Macular degeneration    Onychomycosis of left great toe 09/09/2015   Overweight (BMI 25.0-29.9) 07/25/2012   Pulmonary nodule    PVD (peripheral vascular disease) (HCC)    Uterine cancer (HCC)    uterine( per pt)   Vascular dementia (HCC)    Vitamin D deficiency      Family History  Problem Relation Age of Onset   Hypertension Mother    Heart disease Mother    Hypertension Father    Heart disease Father    Hypertension Sister    HIV/AIDS Daughter    Early death Sister        Gun shot wound   Cerebrovascular Accident Son      Social History   Socioeconomic History   Marital status: Widowed    Spouse name: Not on file   Number of children: 4   Years of education: Not on file   Highest education level: Not on file  Occupational History   Not on file  Tobacco Use   Smoking status: Former    Current packs/day: 0.00    Types: Cigarettes    Quit date: 08/06/1988    Years since quitting: 35.0   Smokeless tobacco: Never  Vaping Use   Vaping status: Never Used  Substance and Sexual Activity   Alcohol use: No    Alcohol/week: 0.0 standard drinks of alcohol   Drug use: No  Sexual activity: Not on file  Other Topics Concern   Not on file  Social History Narrative   [facility]   Social Drivers of Health   Financial Resource Strain: Low Risk  (09/27/2022)   Received from [facility]   Overall Financial Resource Strain (CARDIA)    Difficulty of Paying Living Expenses: Not hard at all  Food Insecurity: No Food Insecurity (04/05/2023)   Hunger Vital Sign    Worried About Running Out of Food in the Last Year: Never true    Ran Out of Food in the Last Year: Never true  Transportation Needs: No Transportation Needs (04/05/2023)   PRAPARE - Scientist, research (physical sciences) (Medical): No    Lack of Transportation (Non-Medical): No  Physical Activity: Not on file  Stress: Not on file  Social Connections: Unknown (09/26/2022)   Received from [facility]   Social Network    Social Network: Not on file  Intimate Partner Violence: Not At Risk (04/05/2023)   Humiliation, Afraid, Rape, and Kick questionnaire    Fear of Current or Ex-Partner: No    Emotionally Abused: No    Physically Abused: No    Sexually Abused: No     Allergies  Allergen Reactions   Clonidine  Derivatives Other (See Comments)    Non-specific heart block     Outpatient Medications Prior to Visit  Medication Sig Dispense Refill   acetaminophen  (TYLENOL ) 650 MG CR tablet Take 650 mg by mouth 3 (three) times daily.     aspirin  EC 81 MG tablet Take 81 mg by mouth daily. Swallow whole.     atorvastatin (LIPITOR) 40 MG tablet Take 40 mg by mouth daily.     bictegravir-emtricitabine -tenofovir  AF (BIKTARVY ) 50-200-25 MG TABS tablet Take 1 tablet by mouth daily. 30 tablet 11   cholecalciferol  (VITAMIN D3) 25 MCG (1000 UNIT) tablet Take 1,000 Units by mouth daily.     citalopram  (CELEXA ) 20 MG tablet Take 20 mg by mouth daily.      clopidogrel  (PLAVIX ) 75 MG tablet Take 1 tablet (75 mg total) by mouth daily. 90 tablet 1   Ferrous Sulfate  Dried (SLOW IRON PO) Take 45 mg by mouth daily.     hydrALAZINE  (APRESOLINE ) 50 MG tablet Take 1 tablet (50 mg total) by mouth 3 (three) times daily. 270 tablet 3   losartan (COZAAR) 25 MG tablet Take 25 mg by mouth daily.     memantine  (NAMENDA  XR) 21 MG CP24 24 hr capsule Take 21 mg by mouth daily.     metolazone (ZAROXOLYN) 2.5 MG tablet Take 2.5 mg by mouth daily.     Multiple Vitamins-Minerals (PRESERVISION AREDS 2 PO) Take 1 tablet by mouth daily.     No facility-administered medications prior to visit.         Objective:   Physical Exam  Vitals:   08/21/23 1046  BP: 138/62  Pulse: (!) 52  SpO2: 99%  Weight: 122 lb (55.3 kg)   Height: 5' 5.5" (1.664 m)    Gen: Pleasant, thin woman, in no distress,  normal affect  ENT: No lesions,  mouth clear,  oropharynx clear, no postnasal drip  Neck: No JVD, no stridor  Lungs: No use of accessory muscles, distant, decreased to both bases, no crackles  Cardiovascular: RRR, heart sounds normal, no murmur or gallops, no peripheral edema  Musculoskeletal: No deformities, no cyanosis or clubbing  Neuro: alert, awake, but somewhat confused.  Poor memory, poor history giving  Skin: Warm, no lesions or rash       Assessment & Plan:  Wenckebach second degree AV block Bradycardia with type 1 second-degree AV block Bradycardia with type 1 second-degree AV block noted. Discussed pacemaker; deferred pending cardiology recommendation. - Review cardiology notes to determine if pacemaker is recommended.    Dyspnea Shortness of breath Dyspnea during ambulation, considering albuterol trial. Bradycardia may contribute as well, cardiac evaluation ongoing. - Trial of albuterol inhaler to assess impact on dyspnea during ambulation.    Pulmonary nodule Possible pulmonary nodule.  Apparently this was a point of concern that prompted her referral, but I do not see any imaging that supports a nodule No pulmonary nodule on December 2024 x-rays. Plan to monitor with chest x-ray in June. - Obtain chest x-ray in June to monitor for changes. - Review records from previous facility for any specific concerns about pulmonary nodule.     Racheal Buddle, MD, PhD 08/21/2023, 2:38 PM [facility] [phone] or if no answer before 7:00PM call [phone] For any issues after 7:00PM please call eLink [phone]

## 2023-08-21 NOTE — Assessment & Plan Note (Signed)
 Bradycardia with type 1 second-degree AV block Bradycardia with type 1 second-degree AV block noted. Discussed pacemaker; deferred pending cardiology recommendation. - Review cardiology notes to determine if pacemaker is recommended.

## 2023-08-21 NOTE — Assessment & Plan Note (Signed)
 Shortness of breath Dyspnea during ambulation, considering albuterol trial. Bradycardia may contribute as well, cardiac evaluation ongoing. - Trial of albuterol inhaler to assess impact on dyspnea during ambulation.

## 2023-08-21 NOTE — Assessment & Plan Note (Signed)
 Possible pulmonary nodule.  Apparently this was a point of concern that prompted her referral, but I do not see any imaging that supports a nodule No pulmonary nodule on December 2024 x-rays. Plan to monitor with chest x-ray in June. - Obtain chest x-ray in June to monitor for changes. - Review records from previous facility for any specific concerns about pulmonary nodule.

## 2023-08-22 ENCOUNTER — Telehealth: Payer: Self-pay | Admitting: Cardiology

## 2023-08-22 NOTE — Telephone Encounter (Signed)
   Cardiac Monitor Alert  Date of alert:  08/22/2023   Patient Name: Karen Luna  DOB: 1931/09/15  MRN: [id number]   [facility] Cardiologist: Eilleen Grates, MD   [facility] EP:  None    Monitor Information: Cardiac Event Monitor [[employer]]  Reason:  urgent monitor report Ordering provider:  Hochrein   Alert 2nd degree AV Block, Type I from 2 days ago 08/20/2023 @ 7:29 pm This is the 4th alert for this rhythm.   Next Cardiology Appointment   Date:  n/a  Provider:  n/a  The patient could NOT be reached by telephone today.    Arrhythmia, symptoms and history reviewed with DOD, Dr. Arlester Ladd.   Plan:  continue monitoring, will forward to MD [facility]) for his FYI, this is the first daytime notification that I can see.....   Other:   Alvenia Aus, RN  08/22/2023 4:27 PM

## 2023-08-22 NOTE — Telephone Encounter (Signed)
 Manny with [employer] calling to report a critical EKG report from heart monitor.   I called [facility] Triage, Manny disconnected before I could get someone on the line.

## 2023-08-23 ENCOUNTER — Other Ambulatory Visit: Payer: Self-pay

## 2023-08-23 ENCOUNTER — Telehealth: Payer: Self-pay

## 2023-08-23 DIAGNOSIS — R001 Bradycardia, unspecified: Secondary | ICD-10-CM

## 2023-08-23 DIAGNOSIS — R55 Syncope and collapse: Secondary | ICD-10-CM

## 2023-08-23 NOTE — Telephone Encounter (Signed)
 Received monitor alert 4/25 at 9:31 am  2nd degree AVB rate 36.Spoke to Tiffany at [facility] she stated patient was not aware.Stated she ate breakfast this morning with no complaints.Spoke to DOD Dr.Harding he advised to schedule appointment soon with EP.Appointment scheduled with Dr.Mealor 4/30 at 9:00 am.Advised Tiffany if patient becomes symptomatic she will need to go to ED.

## 2023-08-24 NOTE — Telephone Encounter (Signed)
 Karen Luna called after-hours today reporting patient had third-degree AV block with PVCs occurring at 5:52 AM on 08/24/2023 Est. heart rate was 48 bpm, episode lasted 1 minute, followed by Mobitz type I-II 1 with heart rate at 30s.  Patient was not reached out.   Called patient's cell phone, patient's son Karen Luna had answered the phone.  He reports that patient has Alzheimer's dementia, currently lives in assisted living facility, she does not have legally appointed power of attorney, but he has been the primary caregiver as well as decision making person for the patient.  He states hospice is coming in a few days to take over patient's care as she has been deteriorating gradually.  He had expressed that he does not wish for pacemaker implant or any kind of surgery for the patient given her current state.  He does not believe an EP appointment on 08/28/23 is necessary.   Please consider cancel EP appointment on 08/28/2023.  Will forward this message to Dr. Arlester Ladd and Dr. Lavonne Prairie.

## 2023-08-28 ENCOUNTER — Ambulatory Visit: Attending: Cardiovascular Disease | Admitting: Cardiovascular Disease

## 2023-08-28 NOTE — Telephone Encounter (Signed)
Not able to leave voice mail.

## 2023-08-29 ENCOUNTER — Encounter: Payer: Self-pay | Admitting: Cardiovascular Disease

## 2023-08-29 NOTE — Telephone Encounter (Addendum)
 Scheduling called to offer appointment per Dr Lavonne Prairie request for cardiac monitor alert. Patient contact, sister, stated patient was moved to hospice today and stated appointment not needed. Will forward to requesting office as fyi.

## 2023-09-19 ENCOUNTER — Ambulatory Visit: Attending: Cardiology

## 2023-09-19 DIAGNOSIS — I6310 Cerebral infarction due to embolism of unspecified precerebral artery: Secondary | ICD-10-CM

## 2023-09-19 DIAGNOSIS — R55 Syncope and collapse: Secondary | ICD-10-CM

## 2023-09-19 DIAGNOSIS — I441 Atrioventricular block, second degree: Secondary | ICD-10-CM

## 2023-09-19 DIAGNOSIS — R001 Bradycardia, unspecified: Secondary | ICD-10-CM

## 2023-09-23 ENCOUNTER — Ambulatory Visit: Payer: Self-pay | Admitting: Cardiology

## 2023-09-23 DIAGNOSIS — I441 Atrioventricular block, second degree: Secondary | ICD-10-CM

## 2023-09-23 DIAGNOSIS — R55 Syncope and collapse: Secondary | ICD-10-CM | POA: Diagnosis not present

## 2023-09-23 DIAGNOSIS — R001 Bradycardia, unspecified: Secondary | ICD-10-CM | POA: Diagnosis not present

## 2023-09-23 DIAGNOSIS — I6310 Cerebral infarction due to embolism of unspecified precerebral artery: Secondary | ICD-10-CM

## 2023-09-25 NOTE — Telephone Encounter (Signed)
 Called and spoke to pt's nurse. Discussed cardiac monitor results. Nurse verbalized understanding. Will also send to PCP.

## 2023-09-25 NOTE — Telephone Encounter (Signed)
-----   Message from Eilleen Grates sent at 09/23/2023 11:30 AM EDT ----- There were no sustained arrhythmias There was no atrial fib There were some bradycardic rhythms but non sustained and none with symptoms.  These occurred during what would typically be sleeping hours. No change in therapy.  Call Ms. Chojnowski with the results and send results to [facility], [address] [facility]

## 2024-06-01 ENCOUNTER — Ambulatory Visit: Admitting: Neurology

## 2024-06-01 ENCOUNTER — Telehealth: Payer: Self-pay | Admitting: Neurology

## 2024-06-01 NOTE — Telephone Encounter (Signed)
 I called pt's facility 2x to r/s 06/01/24 appt- office closed due to weather. No answer either time and VM box is full.

## 2024-06-25 ENCOUNTER — Ambulatory Visit: Admitting: Internal Medicine

## 2024-06-25 ENCOUNTER — Ambulatory Visit: Admitting: Infectious Diseases

## 2024-06-26 ENCOUNTER — Ambulatory Visit: Admitting: Infectious Diseases
